# Patient Record
Sex: Female | Born: 1975 | Race: Black or African American | Hispanic: No | Marital: Married | Smoking: Never smoker
Health system: Southern US, Community
[De-identification: ages and names within clinical notes are randomized; demographics above are authoritative.]

## PROBLEM LIST (undated history)

## (undated) DIAGNOSIS — N3010 Interstitial cystitis (chronic) without hematuria: Secondary | ICD-10-CM

## (undated) DIAGNOSIS — R0609 Other forms of dyspnea: Secondary | ICD-10-CM

## (undated) DIAGNOSIS — K2970 Gastritis, unspecified, without bleeding: Secondary | ICD-10-CM

## (undated) DIAGNOSIS — Z9289 Personal history of other medical treatment: Secondary | ICD-10-CM

## (undated) DIAGNOSIS — R519 Headache, unspecified: Secondary | ICD-10-CM

## (undated) DIAGNOSIS — R51 Headache: Secondary | ICD-10-CM

## (undated) DIAGNOSIS — G43909 Migraine, unspecified, not intractable, without status migrainosus: Secondary | ICD-10-CM

## (undated) DIAGNOSIS — K219 Gastro-esophageal reflux disease without esophagitis: Secondary | ICD-10-CM

## (undated) DIAGNOSIS — R0600 Dyspnea, unspecified: Secondary | ICD-10-CM

## (undated) DIAGNOSIS — J45909 Unspecified asthma, uncomplicated: Secondary | ICD-10-CM

## (undated) DIAGNOSIS — E119 Type 2 diabetes mellitus without complications: Secondary | ICD-10-CM

## (undated) DIAGNOSIS — F419 Anxiety disorder, unspecified: Secondary | ICD-10-CM

## (undated) DIAGNOSIS — E785 Hyperlipidemia, unspecified: Secondary | ICD-10-CM

---

## 2001-07-15 DIAGNOSIS — Z9289 Personal history of other medical treatment: Secondary | ICD-10-CM

## 2001-07-15 HISTORY — DX: Personal history of other medical treatment: Z92.89

## 2002-07-15 HISTORY — PX: ABDOMINAL HYSTERECTOMY: SHX81

## 2004-07-15 DIAGNOSIS — E119 Type 2 diabetes mellitus without complications: Secondary | ICD-10-CM

## 2004-07-15 HISTORY — DX: Type 2 diabetes mellitus without complications: E11.9

## 2005-07-15 HISTORY — PX: BLADDER SURGERY: SHX569

## 2012-02-15 ENCOUNTER — Ambulatory Visit (INDEPENDENT_AMBULATORY_CARE_PROVIDER_SITE_OTHER): Payer: 59 | Admitting: Family Medicine

## 2012-02-15 VITALS — BP 109/74 | HR 82 | Temp 98.4°F | Resp 18 | Ht 64.3 in | Wt 229.0 lb

## 2012-02-15 DIAGNOSIS — B9689 Other specified bacterial agents as the cause of diseases classified elsewhere: Secondary | ICD-10-CM

## 2012-02-15 DIAGNOSIS — E119 Type 2 diabetes mellitus without complications: Secondary | ICD-10-CM

## 2012-02-15 DIAGNOSIS — N898 Other specified noninflammatory disorders of vagina: Secondary | ICD-10-CM

## 2012-02-15 DIAGNOSIS — E0590 Thyrotoxicosis, unspecified without thyrotoxic crisis or storm: Secondary | ICD-10-CM

## 2012-02-15 DIAGNOSIS — B373 Candidiasis of vulva and vagina: Secondary | ICD-10-CM

## 2012-02-15 DIAGNOSIS — IMO0001 Reserved for inherently not codable concepts without codable children: Secondary | ICD-10-CM

## 2012-02-15 DIAGNOSIS — N760 Acute vaginitis: Secondary | ICD-10-CM

## 2012-02-15 LAB — POCT WET PREP WITH KOH
Clue Cells Wet Prep HPF POC: 100
Trichomonas, UA: NEGATIVE
Yeast Wet Prep HPF POC: NEGATIVE

## 2012-02-15 LAB — LIPID PANEL
HDL: 35 mg/dL — ABNORMAL LOW (ref >39)
LDL Cholesterol: 195 mg/dL — ABNORMAL HIGH (ref 0–99)
Total CHOL/HDL Ratio: 7.6 Ratio

## 2012-02-15 LAB — POCT UA - MICROSCOPIC ONLY: Crystals, Ur, HPF, POC: NEGATIVE

## 2012-02-15 LAB — POCT URINALYSIS DIPSTICK
Bilirubin, UA: NEGATIVE
Spec Grav, UA: 1.02

## 2012-02-15 LAB — COMPREHENSIVE METABOLIC PANEL
ALT: 20 U/L (ref 0–35)
Alkaline Phosphatase: 74 U/L (ref 39–117)
Creat: 0.62 mg/dL (ref 0.50–1.10)
Glucose, Bld: 356 mg/dL — ABNORMAL HIGH (ref 70–99)
Sodium: 136 mEq/L (ref 135–145)
Total Bilirubin: 0.4 mg/dL (ref 0.3–1.2)
Total Protein: 7.2 g/dL (ref 6.0–8.3)

## 2012-02-15 LAB — POCT CBC
Hemoglobin: 15.6 g/dL (ref 12.2–16.2)
MCH, POC: 26.9 pg — AB (ref 27–31.2)
MPV: 8.8 fL (ref 0–99.8)
POC MID %: 8.7 %M (ref 0–12)
RBC: 5.8 M/uL — AB (ref 4.04–5.48)
WBC: 6.4 10*3/uL (ref 4.6–10.2)

## 2012-02-15 LAB — POCT GLYCOSYLATED HEMOGLOBIN (HGB A1C): Hemoglobin A1C: 14

## 2012-02-15 LAB — GLUCOSE, POCT (MANUAL RESULT ENTRY): POC Glucose: 357 mg/dl — AB (ref 70–99)

## 2012-02-15 MED ORDER — LISINOPRIL 10 MG PO TABS: 10.0000 mg | ORAL_TABLET | Freq: Every day | ORAL | Status: DC

## 2012-02-15 MED ORDER — METRONIDAZOLE 500 MG PO TABS: 500.0000 mg | ORAL_TABLET | Freq: Two times a day (BID) | ORAL | Status: AC

## 2012-02-15 MED ORDER — BLOOD GLUCOSE METER KIT: PACK | Status: DC

## 2012-02-15 MED ORDER — INSULIN ASPART PROT & ASPART (70-30 MIX) 100 UNIT/ML ~~LOC~~ SUSP: 80.0000 [IU] | Freq: Two times a day (BID) | SUBCUTANEOUS | Status: DC

## 2012-02-15 MED ORDER — FLUCONAZOLE 150 MG PO TABS: 150.0000 mg | ORAL_TABLET | Freq: Once | ORAL | Status: AC

## 2012-02-15 NOTE — Progress Notes (Signed)
Subjective: 36 year old Afro-American female here for her first time. She has a long history of diabetes mellitus. She was formerly on insulin any evidence of 7030 twice a day, but she is out of medicine for about 8 months. She has not had any insurance and not been able to afford things. He has had a hysterectomy. Had a history of interstitial fibrosis, and is on treatment for that. She's had recurrent yeast infections. She saw an eye doctor about 2 months ago and was told she had some bleeding behind her eye she does not have a operational glucose meter. She has had some bladder surgery in addition to the urine surgery. She was on her blood pressure pill also, lisinopril she has a history of asthma and does still have an albuterol inhaler which she infrequently uses.  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 She is married. Has 2 children.  Objective: Obese lady in no acute distress. Her TMs are normal. Eyes PERRLA. Fundi appear benign but I did not get a great examination since the pupils are little constricted today. Her throat was clear. Neck supple without nodes or thyromegaly. No carotid bruits. Chest is clear to auscultation. Heart regular without murmurs gallops or arrhythmias. Abdomen soft. Has some mild generalized tenderness. External genitalia appear normal. Vaginal mucosa has a little whitish discharge, not as bad as it sounds like it might be. Cervix is surgically absent. Wet prep was taken.  Assessment: Diabetes mellitus, controlled Obesity History of diabetic retinopathy History of interstitial cystitis Vaginitis History of hysterectomy Vaginitis in patient with history of recurrent monilia vaginiti   Results for orders placed in visit on 02/15/12  POCT CBC      Component Value Range   WBC 6.4  4.6 - 10.2 K/uL   Lymph, poc 2.2  0.6 - 3.4   POC LYMPH PERCENT 35.1  10 - 50 %L   MID (cbc) 0.6  0 - 0.9   POC MID % 8.7  0 - 12 %M   POC Granulocyte 3.6  2 -  6.9   Granulocyte percent 56.2  37 - 80 %G   RBC 5.80 (*) 4.04 - 5.48 M/uL   Hemoglobin 15.6  12.2 - 16.2 g/dL   HCT, POC 82.9 (*) 56.2 - 47.9 %   MCV 87.4  80 - 97 fL   MCH, POC 26.9 (*) 27 - 31.2 pg   MCHC 30.8 (*) 31.8 - 35.4 g/dL   RDW, POC 13.0     Platelet Count, POC 352  142 - 424 K/uL   MPV 8.8  0 - 99.8 fL  GLUCOSE, POCT (MANUAL RESULT ENTRY)      Component Value Range   POC Glucose 357 (*) 70 - 99 mg/dl  POCT UA - MICROSCOPIC ONLY      Component Value Range   WBC, Ur, HPF, POC 0-1     RBC, urine, microscopic 0-1     Bacteria, U Microscopic trace     Mucus, UA neg     Epithelial cells, urine per micros 0-2     Crystals, Ur, HPF, POC neg     Casts, Ur, LPF, POC neg     Yeast, UA neg    POCT URINALYSIS DIPSTICK      Component Value Range   Color, UA yellow     Clarity, UA clear     Glucose, UA 500mg /dl     Bilirubin, UA neg     Ketones, UA trace  Spec Grav, UA 1.020     Blood, UA neg     pH, UA 5.0     Protein, UA neg     Urobilinogen, UA 0.2     Nitrite, UA neg     Leukocytes, UA Negative    POCT WET PREP WITH KOH      Component Value Range   Trichomonas, UA Negative     Clue Cells Wet Prep HPF POC 100%     Epithelial Wet Prep HPF POC 2-10     Yeast Wet Prep HPF POC neg     Bacteria Wet Prep HPF POC 4++     RBC Wet Prep HPF POC 0-2     WBC Wet Prep HPF POC 5-8     KOH Prep POC Positive    POCT GLYCOSYLATED HEMOGLOBIN (HGB A1C)      Component Value Range   Hemoglobin A1C 14.0     s  Plan: Check labs. It is obvious that we will need to work hard at getting her diabetes under control.  Discussed with her the need for compliance.  Return in 1 mo.  Gradually go up on the insulin to the prescribed dose.

## 2012-02-15 NOTE — Patient Instructions (Addendum)
It is very important to get your diabetes under excellent control and to keep it that way. You could major vision initially if you do not.  Treat the vaginitis as directed.    If worse or not improving return sooner, otherwise will plan to see you in a month.

## 2012-02-16 ENCOUNTER — Encounter: Payer: Self-pay | Admitting: Family Medicine

## 2012-02-26 ENCOUNTER — Ambulatory Visit (INDEPENDENT_AMBULATORY_CARE_PROVIDER_SITE_OTHER): Payer: 59 | Admitting: Family Medicine

## 2012-02-26 VITALS — BP 110/68 | HR 92 | Temp 97.8°F | Resp 14 | Ht 65.0 in | Wt 230.4 lb

## 2012-02-26 DIAGNOSIS — E119 Type 2 diabetes mellitus without complications: Secondary | ICD-10-CM

## 2012-02-26 DIAGNOSIS — B373 Candidiasis of vulva and vagina: Secondary | ICD-10-CM

## 2012-02-26 LAB — POCT WET PREP WITH KOH: KOH Prep POC: POSITIVE

## 2012-02-26 MED ORDER — FLUCONAZOLE 150 MG PO TABS: ORAL_TABLET | ORAL | Status: DC

## 2012-02-26 NOTE — Progress Notes (Signed)
[facility] [address], [place] [address] [phone]  Date:  02/26/2012   Name:  Marissa Barrett   DOB:  [birth year]   MRN:  [id number]  PCP:  No primary provider on file.    Chief Complaint: Vaginal Discharge   History of Present Illness:  Marissa Barrett is a 36 y.o. very pleasant female patient who presents with the following:  History of hysterectomy and uncontrolled DM.  She was seen here on the 3rd of this month, and treated for BV and yeast vaginitis with diflucan and flagyl.  She also had an A1c of 14 at that time, and was started back on Novolog 70/30.  However, she notes that her vaginal discharge and itching has "gotten worse.'   Her glucose has been running in the 200's, better than the 300's where she had been in the past.  She has had trouble with recurrent yeast vaginitis in the past.     She notes that she has been treated with extended regimens of diflucan in the past- up to 30 days of daily diflucan at a time.    Marissa Barrett has cut out most sugars from her diet, and is trying to get her DM under control.  Her weight has not changed since yer last visit here.    She is married and has 2 teenage children.  She does not feel that there is any risk of STI  There is no problem list on file for this patient.   No past medical history on file.  No past surgical history on file.  History  Substance Use Topics  . Smoking status: Never Smoker   . Smokeless tobacco: Never Used  . Alcohol Use: Not on file    No family history on file.  No Known Allergies  Medication list has been reviewed and updated.  Current Outpatient Prescriptions on File Prior to Visit  Medication Sig Dispense Refill  . Blood Glucose Monitoring Suppl (BLOOD GLUCOSE METER) kit Use as instructed  1 each  0  . insulin aspart protamine-insulin aspart (NOVOLOG MIX 70/30) (70-30) 100 UNIT/ML injection Inject 80 Units into the skin 2 (two) times daily with a meal.  40 mL  12  .  lisinopril (PRINIVIL,ZESTRIL) 10 MG tablet Take 1 tablet (10 mg total) by mouth daily.  30 tablet  5  . metroNIDAZOLE (FLAGYL) 500 MG tablet Take 1 tablet (500 mg total) by mouth 2 (two) times daily before lunch and supper.  14 tablet  0    Review of Systems:  As per HPI- otherwise negative.   Physical Examination: Filed Vitals:   02/26/12 0753  BP: 110/68  Pulse: 92  Temp: 97.8 F (36.6 C)  Resp: 14   Filed Vitals:   02/26/12 0753  Height: 5\' 5"  (1.651 m)  Weight: 230 lb 6.4 oz (104.509 kg)   Body mass index is 38.34 kg/(m^2). Ideal Body Weight: Weight in (lb) to have BMI = 25: 149.9   GEN: WDWN, NAD, Non-toxic, A & O x 3, obese HEENT: Atraumatic, Normocephalic. Neck supple. No masses, No LAD. Ears and Nose: No external deformity. CV: RRR, No M/G/R. No JVD. No thrill. No extra heart sounds. PULM: CTA B, no wheezes, crackles, rhonchi. No retractions. No resp. distress. No accessory muscle use. ABD: S, NT, ND, +BS. No rebound. No HSM. GU: performed external exam- irritation and redness of external itching EXTR: No c/c/e NEURO Normal gait.  PSYCH: Normally interactive. Conversant. Not depressed or  anxious appearing.  Calm demeanor.   Results for orders placed in visit on 02/26/12  POCT WET PREP WITH KOH      Component Value Range   Trichomonas, UA Negative     Clue Cells Wet Prep HPF POC 1-2     Epithelial Wet Prep HPF POC 2-4     Yeast Wet Prep HPF POC positive     Bacteria Wet Prep HPF POC 2+     RBC Wet Prep HPF POC neg     WBC Wet Prep HPF POC 15-20     KOH Prep POC Positive       Assessment and Plan: 1. Yeast vaginitis  POCT Wet Prep with KOH, fluconazole (DIFLUCAN) 150 MG tablet  2. DM type 2 (diabetes mellitus, type 2)     Will use a more extended regimen of diflucan- one every 3 days for 3 doses, then every week as needed for up to 6 months. She is working hard on her diet.  Continue insulin, and see pt instructions for more.  BP is well controlled.     Abbe Amsterdam, MD

## 2012-02-26 NOTE — Patient Instructions (Addendum)
Work on eating yogurt every day.  Continue taking your insulin, and let's check back in 6 weeks to follow-up your A1c

## 2012-03-08 DIAGNOSIS — R109 Unspecified abdominal pain: Secondary | ICD-10-CM | POA: Insufficient documentation

## 2012-03-08 DIAGNOSIS — N3010 Interstitial cystitis (chronic) without hematuria: Secondary | ICD-10-CM | POA: Insufficient documentation

## 2012-03-08 DIAGNOSIS — R61 Generalized hyperhidrosis: Secondary | ICD-10-CM | POA: Insufficient documentation

## 2012-03-08 DIAGNOSIS — R079 Chest pain, unspecified: Principal | ICD-10-CM | POA: Insufficient documentation

## 2012-03-08 DIAGNOSIS — R0602 Shortness of breath: Secondary | ICD-10-CM | POA: Insufficient documentation

## 2012-03-08 NOTE — ED Notes (Signed)
Patient complaining of intermittent chest pain x 1 week; reports that chest pain worsened over the course of the day.  Location of chest pain mid-sternal; radiates to back.  Describes chest pain as "spasm".  Denies shortness of breath, cough, recent cold symptoms.  Reports nausea.

## 2012-03-09 ENCOUNTER — Other Ambulatory Visit: Payer: Self-pay

## 2012-03-09 ENCOUNTER — Observation Stay (HOSPITAL_COMMUNITY)
Admission: EM | Admit: 2012-03-09 | Discharge: 2012-03-09 | Disposition: A | Discharge status: Home / Self Care | Payer: 59 | Attending: Emergency Medicine | Admitting: Emergency Medicine

## 2012-03-09 ENCOUNTER — Emergency Department (HOSPITAL_COMMUNITY)
Admit: 2012-03-09 | Discharge: 2012-03-09 | Disposition: A | Discharge status: Home / Self Care | Payer: Medicaid Other | Attending: Emergency Medicine | Admitting: Emergency Medicine

## 2012-03-09 ENCOUNTER — Encounter (HOSPITAL_COMMUNITY): Payer: Self-pay | Admitting: Emergency Medicine

## 2012-03-09 DIAGNOSIS — N3010 Interstitial cystitis (chronic) without hematuria: Secondary | ICD-10-CM | POA: Insufficient documentation

## 2012-03-09 DIAGNOSIS — R0789 Other chest pain: Secondary | ICD-10-CM | POA: Insufficient documentation

## 2012-03-09 DIAGNOSIS — R072 Precordial pain: Secondary | ICD-10-CM

## 2012-03-09 DIAGNOSIS — R079 Chest pain, unspecified: Secondary | ICD-10-CM

## 2012-03-09 DIAGNOSIS — R1011 Right upper quadrant pain: Secondary | ICD-10-CM | POA: Insufficient documentation

## 2012-03-09 HISTORY — DX: Unspecified asthma, uncomplicated: J45.909

## 2012-03-09 HISTORY — DX: Hyperlipidemia, unspecified: E78.5

## 2012-03-09 HISTORY — DX: Interstitial cystitis (chronic) without hematuria: N30.10

## 2012-03-09 LAB — URINALYSIS, ROUTINE W REFLEX MICROSCOPIC
Leukocytes, UA: NEGATIVE
Nitrite: NEGATIVE
Specific Gravity, Urine: 1.03 (ref 1.005–1.030)
Urobilinogen, UA: 1 mg/dL (ref 0.0–1.0)
pH: 5.5 (ref 5.0–8.0)

## 2012-03-09 LAB — D-DIMER, QUANTITATIVE: D-Dimer, Quant: 0.28 ug/mL-FEU (ref 0.00–0.48)

## 2012-03-09 LAB — TROPONIN I: Troponin I: <0.3 ng/mL (ref <0.30)

## 2012-03-09 LAB — POCT I-STAT TROPONIN I: Troponin i, poc: 0 ng/mL (ref 0.00–0.08)

## 2012-03-09 LAB — CBC WITH DIFFERENTIAL/PLATELET
Basophils Absolute: 0 10*3/uL (ref 0.0–0.1)
Basophils Relative: 0 % (ref 0–1)
Eosinophils Absolute: 0.1 10*3/uL (ref 0.0–0.7)
Hemoglobin: 15 g/dL (ref 12.0–15.0)
MCH: 28.4 pg (ref 26.0–34.0)
MCHC: 35.2 g/dL (ref 30.0–36.0)
Monocytes Absolute: 0.4 10*3/uL (ref 0.1–1.0)
Monocytes Relative: 6 % (ref 3–12)
Neutro Abs: 3.3 10*3/uL (ref 1.7–7.7)
Neutrophils Relative %: 48 % (ref 43–77)
RDW: 12.3 % (ref 11.5–15.5)

## 2012-03-09 LAB — COMPREHENSIVE METABOLIC PANEL
AST: 24 U/L (ref 0–37)
Albumin: 3.4 g/dL — ABNORMAL LOW (ref 3.5–5.2)
BUN: 16 mg/dL (ref 6–23)
Chloride: 103 mEq/L (ref 96–112)
Creatinine, Ser: 0.38 mg/dL — ABNORMAL LOW (ref 0.50–1.10)
Potassium: 4.2 mEq/L (ref 3.5–5.1)
Total Protein: 7.4 g/dL (ref 6.0–8.3)

## 2012-03-09 MED ORDER — KETOROLAC TROMETHAMINE 30 MG/ML IJ SOLN
30.0000 mg | Freq: Once | INTRAMUSCULAR | Status: AC
  Administered 2012-03-09: 30 mg via INTRAVENOUS
  Filled 2012-03-09: qty 1

## 2012-03-09 MED ORDER — ASPIRIN 81 MG PO CHEW
324.0000 mg | CHEWABLE_TABLET | Freq: Once | ORAL | Status: AC
Start: 2012-03-09 — End: 2012-03-09
  Administered 2012-03-09: 324 mg via ORAL
  Filled 2012-03-09: qty 4
  Filled 2012-03-09: qty 1

## 2012-03-09 MED ORDER — IBUPROFEN 800 MG PO TABS: 800.0000 mg | ORAL_TABLET | Freq: Three times a day (TID) | ORAL | Status: AC | PRN

## 2012-03-09 NOTE — ED Notes (Signed)
Pt taken to ECHO

## 2012-03-09 NOTE — ED Notes (Signed)
Pt transported to Echo. 

## 2012-03-09 NOTE — ED Provider Notes (Addendum)
History     CSN: [id number]  Arrival date & time 03/08/12  2351   First MD Initiated Contact with Patient 03/09/12 0234      Chief Complaint  Patient presents with  . Chest Pain    (Consider location/radiation/quality/duration/timing/severity/associated sxs/prior treatment) HPI This is a 36 year old black female with a three-day history of intermittent chest pain. The pain is substernal and is accompanied by right flank pain. The pain feels like a cramp. The pain lasts for several minutes at a time. It is accompanied by mild shortness of breath and diaphoresis but not nausea. Nothing seems to trigger it, exacerbated or mitigate it. The pain is moderate in severity and has been worsening. She denies leg pain or swelling. She is having some nausea but is not associated with the episodes of chest pain. She also complains of right lower quadrant abdominal pain consistent with her chronic interstitial cystitis.  Past Medical History  Diagnosis Date  . Interstitial cystitis   . Asthma   . Diabetes mellitus     Past Surgical History  Procedure Date  . Abdominal hysterectomy     History reviewed. No pertinent family history.  History  Substance Use Topics  . Smoking status: Never Smoker   . Smokeless tobacco: Never Used  . Alcohol Use: No    OB History    Grav Para Term Preterm Abortions TAB SAB Ect Mult Living                  Review of Systems  All other systems reviewed and are negative.    Allergies  Review of patient's allergies indicates no known allergies.  Home Medications   Current Outpatient Rx  Name Route Sig Dispense Refill  . FLUCONAZOLE 150 MG PO TABS  Take one every 3 days for 3 doses, then take every week for up to 6 months 30 tablet 0  . INSULIN ASPART PROT & ASPART (70-30) 100 UNIT/ML [place] SUSP Subcutaneous Inject 80 Units into the skin 2 (two) times daily with a meal. 40 mL 12    Four 10 ml vials.  Also supply needed syringes a ...  . LISINOPRIL  10 MG PO TABS Oral Take 1 tablet (10 mg total) by mouth daily. 30 tablet 5  . BLOOD GLUCOSE METER KIT  Use as instructed 1 each 0    Supply sufficient test strips to test twice daily, ...    BP 125/80  Pulse 78  Temp 98.2 F (36.8 C) (Oral)  Resp 19  SpO2 98%  LMP 02/15/2003  Physical Exam General: Well-developed, well-nourished female in no acute distress; appearance consistent with age of record HENT: normocephalic, atraumatic Eyes: pupils equal round and reactive to light; extraocular muscles intact Neck: supple Heart: regular rate and rhythm; no murmurs Chest: Less sternal tenderness that does not reproduce the pain of the chief complaint Lungs: clear to auscultation bilaterally Abdomen: soft; nondistended; mild right lower quadrant tenderness; bowel sounds present Extremities: No deformity; full range of motion; pulses normal; no edema Neurologic: Awake, alert and oriented; motor function intact in all extremities and symmetric; no facial droop Skin: Warm and dry Psychiatric: Normal mood and affect    ED Course  Procedures (including critical care time)     MDM   Nursing notes and vitals signs, including pulse oximetry, reviewed.  Summary of this visit's results, reviewed by myself:  Labs:  Results for orders placed during the hospital encounter of 03/09/12  CBC WITH DIFFERENTIAL  Component Value Range   WBC 7.0  4.0 - 10.5 K/uL   RBC 5.28 (*) 3.87 - 5.11 MIL/uL   Hemoglobin 15.0  12.0 - 15.0 g/dL   HCT 16.1  09.6 - 04.5 %   MCV 80.7  78.0 - 100.0 fL   MCH 28.4  26.0 - 34.0 pg   MCHC 35.2  30.0 - 36.0 g/dL   RDW 40.9  81.1 - 91.4 %   Platelets 262  150 - 400 K/uL   Neutrophils Relative 48  43 - 77 %   Neutro Abs 3.3  1.7 - 7.7 K/uL   Lymphocytes Relative 44  12 - 46 %   Lymphs Abs 3.1  0.7 - 4.0 K/uL   Monocytes Relative 6  3 - 12 %   Monocytes Absolute 0.4  0.1 - 1.0 K/uL   Eosinophils Relative 1  0 - 5 %   Eosinophils Absolute 0.1  0.0 - 0.7  K/uL   Basophils Relative 0  0 - 1 %   Basophils Absolute 0.0  0.0 - 0.1 K/uL  COMPREHENSIVE METABOLIC PANEL      Component Value Range   Sodium 136  135 - 145 mEq/L   Potassium 4.2  3.5 - 5.1 mEq/L   Chloride 103  96 - 112 mEq/L   CO2 24  19 - 32 mEq/L   Glucose, Bld 229 (*) 70 - 99 mg/dL   BUN 16  6 - 23 mg/dL   Creatinine, Ser 7.82 (*) 0.50 - 1.10 mg/dL   Calcium 9.4  8.4 - 95.6 mg/dL   Total Protein 7.4  6.0 - 8.3 g/dL   Albumin 3.4 (*) 3.5 - 5.2 g/dL   AST 24  0 - 37 U/L   ALT 24  0 - 35 U/L   Alkaline Phosphatase 72  39 - 117 U/L   Total Bilirubin 0.2 (*) 0.3 - 1.2 mg/dL   GFR calc non Af Amer >90  >90 mL/min   GFR calc Af Amer >90  >90 mL/min  POCT I-STAT TROPONIN I      Component Value Range   Troponin i, poc 0.00  0.00 - 0.08 ng/mL   Comment 3           URINALYSIS, ROUTINE W REFLEX MICROSCOPIC      Component Value Range   Color, Urine YELLOW  YELLOW   APPearance CLOUDY (*) CLEAR   Specific Gravity, Urine 1.030  1.005 - 1.030   pH 5.5  5.0 - 8.0   Glucose, UA 100 (*) NEGATIVE mg/dL   Hgb urine dipstick NEGATIVE  NEGATIVE   Bilirubin Urine NEGATIVE  NEGATIVE   Ketones, ur NEGATIVE  NEGATIVE mg/dL   Protein, ur NEGATIVE  NEGATIVE mg/dL   Urobilinogen, UA 1.0  0.0 - 1.0 mg/dL   Nitrite NEGATIVE  NEGATIVE   Leukocytes, UA NEGATIVE  NEGATIVE  TROPONIN I      Component Value Range   Troponin I <0.30  <0.30 ng/mL  D-DIMER, QUANTITATIVE      Component Value Range   D-Dimer, Quant 0.28  0.00 - 0.48 ug/mL-FEU    Imaging Studies: Dg Chest 2 View  03/09/2012  *RADIOLOGY REPORT*  Clinical Data: Chest pain.  CHEST - 2 VIEW  Comparison: None.  Findings: Lungs are clear.  Heart size is normal.  No pneumothorax or pleural fluid.  IMPRESSION: Negative chest.   Original Report Authenticated By: Bernadene Bell. Maricela Curet, M.D.     Date: 03/08/2012 11:59 PM  Rate:  88  Rhythm: normal sinus rhythm  QRS Axis: normal  Intervals: normal  ST/T Wave abnormalities: normal  Conduction  Disutrbances: none  Narrative Interpretation: unremarkable  Comparison with previous EKG: none available    Date: 03/09/2012 2:47 AM (during episode of pain)  Rate: 82  Rhythm: normal sinus rhythm  QRS Axis: normal  Intervals: normal  ST/T Wave abnormalities: normal  Conduction Disutrbances: none  Narrative Interpretation: unremarkable  Comparison with previous EKG: unchanged  4:11 AM We'll place in the CDU on the chest pain protocol.   Date: 03/09/2012 5:45 AM  Rate: 77  Rhythm: normal sinus rhythm  QRS Axis: normal  Intervals: normal  ST/T Wave abnormalities: normal  Conduction Disutrbances: none  Narrative Interpretation: unremarkable  Comparison with previous EKG: unchanged                  Hanley Seamen, MD 03/09/12 0411  Hanley Seamen, MD 03/09/12 [phone]

## 2012-03-09 NOTE — ED Provider Notes (Signed)
8:03 AM Patient is in CDU under observation, chest pain protocol.  Sign out received from Dr Read Drivers.  Patient has had 3 days of substernal CP with associated SOB and diaphoresis.  Plan is for stress echo this morning.  Pt currently is at vascular lab having study done.    9:42 AM Received call from Dr Anne Fu, cardiology, who states stress echo is nondiagnostic because patient did not reach target heart rate.  However, the images he did see were without abnormality.    10:21 AM Spoke with Rosalyn Charters cardiology, who will send someone to see the patient.  Patient is A&Ox4, NAD, RRR, no m/r/g, lungs CTAB, abd soft, NT, extremities without edema, distal pulses intact.   11:16 AM Discussed patient with Judd Lien cardiology PA - they believe the pain is not cardiac and patient may be d/c home without cardiology follow up.  She noted RUQ pain to me and in her note.  I have reexamined the patient.  Pt with right costochondral margin tenderness, mild right sided abdominal tenderness.  Pt notes this right sided abdominal tenderness is chronic and unchanged.  LFTs, WBC normal.  Pt afebrile, nontoxic.  Doubt gallbladder pathology causing symptoms.  Plan is for d/c home with PCP resources for follow up.  Pt reports improvement of pain with toradol.   Pt given return precautions.  Pt verbalizes understanding and agrees with plan.     Results for orders placed during the hospital encounter of 03/09/12  CBC WITH DIFFERENTIAL      Component Value Range   WBC 7.0  4.0 - 10.5 K/uL   RBC 5.28 (*) 3.87 - 5.11 MIL/uL   Hemoglobin 15.0  12.0 - 15.0 g/dL   HCT 54.0  98.1 - 19.1 %   MCV 80.7  78.0 - 100.0 fL   MCH 28.4  26.0 - 34.0 pg   MCHC 35.2  30.0 - 36.0 g/dL   RDW 47.8  29.5 - 62.1 %   Platelets 262  150 - 400 K/uL   Neutrophils Relative 48  43 - 77 %   Neutro Abs 3.3  1.7 - 7.7 K/uL   Lymphocytes Relative 44  12 - 46 %   Lymphs Abs 3.1  0.7 - 4.0 K/uL   Monocytes Relative 6  3 - 12 %   Monocytes  Absolute 0.4  0.1 - 1.0 K/uL   Eosinophils Relative 1  0 - 5 %   Eosinophils Absolute 0.1  0.0 - 0.7 K/uL   Basophils Relative 0  0 - 1 %   Basophils Absolute 0.0  0.0 - 0.1 K/uL  COMPREHENSIVE METABOLIC PANEL      Component Value Range   Sodium 136  135 - 145 mEq/L   Potassium 4.2  3.5 - 5.1 mEq/L   Chloride 103  96 - 112 mEq/L   CO2 24  19 - 32 mEq/L   Glucose, Bld 229 (*) 70 - 99 mg/dL   BUN 16  6 - 23 mg/dL   Creatinine, Ser 3.08 (*) 0.50 - 1.10 mg/dL   Calcium 9.4  8.4 - 65.7 mg/dL   Total Protein 7.4  6.0 - 8.3 g/dL   Albumin 3.4 (*) 3.5 - 5.2 g/dL   AST 24  0 - 37 U/L   ALT 24  0 - 35 U/L   Alkaline Phosphatase 72  39 - 117 U/L   Total Bilirubin 0.2 (*) 0.3 - 1.2 mg/dL   GFR calc non Af Amer >90  >  90 mL/min   GFR calc Af Amer >90  >90 mL/min  POCT I-STAT TROPONIN I      Component Value Range   Troponin i, poc 0.00  0.00 - 0.08 ng/mL   Comment 3           URINALYSIS, ROUTINE W REFLEX MICROSCOPIC      Component Value Range   Color, Urine YELLOW  YELLOW   APPearance CLOUDY (*) CLEAR   Specific Gravity, Urine 1.030  1.005 - 1.030   pH 5.5  5.0 - 8.0   Glucose, UA 100 (*) NEGATIVE mg/dL   Hgb urine dipstick NEGATIVE  NEGATIVE   Bilirubin Urine NEGATIVE  NEGATIVE   Ketones, ur NEGATIVE  NEGATIVE mg/dL   Protein, ur NEGATIVE  NEGATIVE mg/dL   Urobilinogen, UA 1.0  0.0 - 1.0 mg/dL   Nitrite NEGATIVE  NEGATIVE   Leukocytes, UA NEGATIVE  NEGATIVE  TROPONIN I      Component Value Range   Troponin I <0.30  <0.30 ng/mL  D-DIMER, QUANTITATIVE      Component Value Range   D-Dimer, Quant 0.28  0.00 - 0.48 ug/mL-FEU   Dg Chest 2 View  03/09/2012  *RADIOLOGY REPORT*  Clinical Data: Chest pain.  CHEST - 2 VIEW  Comparison: None.  Findings: Lungs are clear.  Heart size is normal.  No pneumothorax or pleural fluid.  IMPRESSION: Negative chest.   Original Report Authenticated By: Bernadene Bell. Maricela Curet, M.D.       [place], [place] 03/09/12 1529

## 2012-03-09 NOTE — Consult Note (Signed)
Consult Note  Patient ID: Essense Bousquet MRN: [id number], SOB: 1975-12-11 35 y.o. Date of Encounter: 03/09/2012, 10:44 AM  Primary Physician: None Primary Cardiologist: None  Chief Complaint: chest pain  HPI: 36 y.o. female w/ PMHx significant for uncontrolled DM and HLD who presented to [facility] on 03/08/2012 with complaints of chest pain.  Stress test in 2005 as preop eval for gastric bypass that was reportedly normal (did not have gastric bypass). Friday 16th woke up and felt a sharp muscle spasm pain in chest. Radiates to right back. Lasted a couple of minutes then resolved. Has occurred about once a day since that time, but has been more severe since this last Friday 23rd. Not worse with exertion. Worse with deep inspiration. Worse with eating. No change with movement. Difficulty taking a deep breath when the pain is present and feels "a little clammy" with the pain. She is able to walk and climb stairs without chest pain, but does have dyspnea if she pushes herself.  In the ED, EKG revealed NSR with no acute ST/T changes. CXR was without acute cardiopulmonary abnormalities. Labs are significant for normal troponin x2, Glucose 229, DDimer 0.28, otherwise unremarkable CBC/CMET. She was monitored overnight in the CDU and underwent stress echo this morning. She was unable to reach target heart rate and therefore the test was non diagnostic.    Past Medical History  Diagnosis Date  . Interstitial cystitis   . Asthma   . Diabetes mellitus   . HLD (hyperlipidemia)      Surgical History:  Past Surgical History  Procedure Date  . Abdominal hysterectomy      Home Meds: Medication Sig  fluconazole (DIFLUCAN) 150 MG tablet Take one every 3 days for 3 doses, then take every week for up to 6 months  insulin aspart protamine-insulin aspart (NOVOLOG MIX 70/30) (70-30) 100 UNIT/ML injection Inject 80 Units into the skin 2 (two) times daily with a meal.  lisinopril (PRINIVIL,ZESTRIL)  10 MG tablet Take 1 tablet (10 mg total) by mouth daily.  Blood Glucose Monitoring Suppl (BLOOD GLUCOSE METER) kit Use as instructed  SIMVISTATIN 20mg  1 table daily    Allergies: No Known Allergies  History   Social History  . Marital Status: Unknown    Spouse Name: N/A    Number of Children: N/A  . Years of Education: N/A   Occupational History  . Not on file.   Social History Main Topics  . Smoking status: Never Smoker   . Smokeless tobacco: Never Used  . Alcohol Use: Yes     rarely  . Drug Use: No  . Sexually Active: Not on file   Other Topics Concern  . Not on file   Social History Narrative  . No narrative on file     Family History  Problem Relation Age of Onset  . Heart disease      No known family history    Review of Systems: General: negative for chills, fever, night sweats or weight changes.  Cardiovascular: (+) chest pain, dyspnea on exertion; negative for shortness of breath, edema, orthopnea, palpitations, paroxysmal nocturnal dyspnea  Dermatological: negative for rash Respiratory: negative for cough or wheezing Urologic: negative for hematuria Abdominal: negative for nausea, vomiting, diarrhea, bright red blood per rectum, melena, or hematemesis Neurologic: negative for visual changes, syncope, or dizziness All other systems reviewed and are otherwise negative except as noted above.  Labs:   Component Value Date   WBC 7.0 03/09/2012  HGB 15.0 03/09/2012   HCT 42.6 03/09/2012   MCV 80.7 03/09/2012   PLT 262 03/09/2012    Lab 03/09/12 0002  NA 136  K 4.2  CL 103  CO2 24  BUN 16  CREATININE 0.38*  CALCIUM 9.4  PROT 7.4  BILITOT 0.2*  ALKPHOS 72  ALT 24  AST 24  GLUCOSE 229*     03/09/2012 00:16 03/09/2012 03:09  Troponin I  <0.30  Troponin i, poc 0.00    Component Value Date   CHOL 266* 02/15/2012   HDL 35* 02/15/2012   LDLCALC 195* 02/15/2012   TRIG 181* 02/15/2012   Component Value Date   DDIMER 0.28 03/09/2012     Radiology/Studies:   03/09/2012 - Chest 2 View Findings: Lungs are clear.  Heart size is normal.  No pneumothorax or pleural fluid.  IMPRESSION: Negative chest.     03/09/12 - Stress Echo Study Conclusions: - Stress ECG conclusions: The stress ECG was normal. The stress ECG was non-diagnostic due to failure to achieve target heart rate. - Staged echo: Normal echo stress  Impressions: Overall non diagnostic due to failure to acheive target heart rate. There were no wall motion abnormalities at sub-maximal heart rate.    EKG: 03/08/12 @ 2359 - Sinus rhythm 88bpm, no acute ischemic changes  Physical Exam:  Blood pressure 107/67, pulse 82, temperature 97.9 F (36.6 C), temperature source Oral, resp. rate 18, last menstrual period 02/15/2003, SpO2 97.00%. General: Overweight black female, in no acute distress. Head: Normocephalic, atraumatic, sclera non-icteric, nares are without discharge Neck: Supple. Negative for carotid bruits. JVD not elevated. Lungs: Clear bilaterally to auscultation without wheezes, rales, or rhonchi. Breathing is unlabored. Heart: RRR with S1 S2. No murmurs, rubs, or gallops appreciated. Abdomen: Soft,RUQ tenderness, non-distended with normoactive bowel sounds. No rebound/guarding. No obvious abdominal masses. Msk:  Strength and tone appear normal for age. Extremities: No edema. No clubbing or cyanosis. Distal pedal pulses are 2+ and equal bilaterally. Neuro: Alert and oriented X 3. Moves all extremities spontaneously. Psych:  Responds to questions appropriately with a normal affect.    ASSESSMENT AND PLAN:  36 y.o. female w/ PMHx significant for uncontrolled DM and HLD who presented to [facility] on 03/08/2012 with complaints of chest pain.  See MD not below  Signed, HOPE, JESSICA PA-C 03/09/2012, 10:44 AM  As above, patient seen and examined. Briefly 36 year old female with past medical history of diabetes and hyperlipidemia for evaluation of chest  pain. Over the past 10 days she complains of substernal chest pain described as a sharp sensation radiating to the right breast. It lasts 2 minutes and resolves spontaneously. It is not exertional and there are no associated symptoms. It can increase with inspiration and eating. She does not have exertional chest pain. She has some dyspnea on exertion. Exam with questionable right upper quadrant tenderness. Liver functions normal. Electrocardiogram shows sinus rhythm with no ST changes. D-dimer normal. Cardiac markers negative. Stress echocardiogram showed no stress induced wall motion abnormalities though the patient did not achieve target heart rate. Patient symptoms are not consistent with cardiac etiology and given the above test results I do not think further ischemia evaluation is warranted. I also think gallstones is less likely but given mild right upper quadrant tenderness to palpation would pursue ultrasound. Otherwise further workup of noncardiac chest pain per emergency room. Please call with questions. Arlys John Crenshaw 11:02 AM

## 2012-03-09 NOTE — ED Notes (Signed)
Patient care assumed at this time.

## 2012-03-09 NOTE — ED Notes (Signed)
I gave the patient a warm blanket. 

## 2012-03-09 NOTE — ED Notes (Signed)
Echo calling, reporting will be send for patient in 15 mins

## 2012-03-09 NOTE — Progress Notes (Signed)
  Echocardiogram Echocardiogram Stress Test has been performed.  Marissa Barrett 03/09/2012, 9:54 AM

## 2012-03-18 NOTE — Progress Notes (Signed)
Observation review is complete for 03/08/2012 visit.

## 2012-03-21 ENCOUNTER — Other Ambulatory Visit (HOSPITAL_COMMUNITY): Payer: 59

## 2012-03-21 ENCOUNTER — Encounter (HOSPITAL_COMMUNITY): Payer: Self-pay | Admitting: *Deleted

## 2012-03-21 ENCOUNTER — Emergency Department (HOSPITAL_COMMUNITY): Payer: Medicaid Other

## 2012-03-21 ENCOUNTER — Emergency Department (HOSPITAL_COMMUNITY)
Admission: EM | Admit: 2012-03-21 | Discharge: 2012-03-21 | Disposition: A | Discharge status: Home / Self Care | Payer: Medicaid Other | Attending: Emergency Medicine | Admitting: Emergency Medicine

## 2012-03-21 DIAGNOSIS — R109 Unspecified abdominal pain: Secondary | ICD-10-CM | POA: Insufficient documentation

## 2012-03-21 DIAGNOSIS — J45909 Unspecified asthma, uncomplicated: Secondary | ICD-10-CM | POA: Insufficient documentation

## 2012-03-21 DIAGNOSIS — E785 Hyperlipidemia, unspecified: Secondary | ICD-10-CM | POA: Insufficient documentation

## 2012-03-21 DIAGNOSIS — R110 Nausea: Secondary | ICD-10-CM | POA: Insufficient documentation

## 2012-03-21 DIAGNOSIS — R739 Hyperglycemia, unspecified: Secondary | ICD-10-CM

## 2012-03-21 DIAGNOSIS — Z794 Long term (current) use of insulin: Secondary | ICD-10-CM | POA: Insufficient documentation

## 2012-03-21 DIAGNOSIS — R079 Chest pain, unspecified: Secondary | ICD-10-CM | POA: Insufficient documentation

## 2012-03-21 DIAGNOSIS — Z79899 Other long term (current) drug therapy: Secondary | ICD-10-CM | POA: Insufficient documentation

## 2012-03-21 DIAGNOSIS — E119 Type 2 diabetes mellitus without complications: Secondary | ICD-10-CM | POA: Insufficient documentation

## 2012-03-21 DIAGNOSIS — R1013 Epigastric pain: Secondary | ICD-10-CM | POA: Insufficient documentation

## 2012-03-21 LAB — COMPREHENSIVE METABOLIC PANEL
Albumin: 3.7 g/dL (ref 3.5–5.2)
Alkaline Phosphatase: 74 U/L (ref 39–117)
BUN: 16 mg/dL (ref 6–23)
CO2: 25 mEq/L (ref 19–32)
Chloride: 100 mEq/L (ref 96–112)
Creatinine, Ser: 0.37 mg/dL — ABNORMAL LOW (ref 0.50–1.10)
GFR calc Af Amer: >90 mL/min (ref >90)
GFR calc non Af Amer: >90 mL/min (ref >90)
Glucose, Bld: 358 mg/dL — ABNORMAL HIGH (ref 70–99)
Potassium: 4.3 mEq/L (ref 3.5–5.1)
Total Bilirubin: 0.2 mg/dL — ABNORMAL LOW (ref 0.3–1.2)

## 2012-03-21 LAB — URINALYSIS, ROUTINE W REFLEX MICROSCOPIC
Bilirubin Urine: NEGATIVE
Ketones, ur: 15 mg/dL — AB
Nitrite: NEGATIVE
Protein, ur: NEGATIVE mg/dL
pH: 6 (ref 5.0–8.0)

## 2012-03-21 LAB — CBC WITH DIFFERENTIAL/PLATELET
HCT: 42.6 % (ref 36.0–46.0)
Hemoglobin: 14.9 g/dL (ref 12.0–15.0)
Lymphs Abs: 2.4 10*3/uL (ref 0.7–4.0)
MCHC: 35 g/dL (ref 30.0–36.0)
Monocytes Absolute: 0.5 10*3/uL (ref 0.1–1.0)
Monocytes Relative: 6 % (ref 3–12)
Neutro Abs: 4.4 10*3/uL (ref 1.7–7.7)
Neutrophils Relative %: 60 % (ref 43–77)
RBC: 5.29 MIL/uL — ABNORMAL HIGH (ref 3.87–5.11)

## 2012-03-21 LAB — URINE MICROSCOPIC-ADD ON

## 2012-03-21 LAB — LIPASE, BLOOD: Lipase: 41 U/L (ref 11–59)

## 2012-03-21 MED ORDER — ONDANSETRON HCL 4 MG/2ML IJ SOLN
4.0000 mg | Freq: Once | INTRAMUSCULAR | Status: AC
  Administered 2012-03-21: 4 mg via INTRAVENOUS
  Filled 2012-03-21: qty 2

## 2012-03-21 MED ORDER — SODIUM CHLORIDE 0.9 % IV BOLUS (SEPSIS)
500.0000 mL | Freq: Once | INTRAVENOUS | Status: AC
  Administered 2012-03-21: 500 mL via INTRAVENOUS

## 2012-03-21 MED ORDER — ONDANSETRON 4 MG PO TBDP: 4.0000 mg | ORAL_TABLET | Freq: Three times a day (TID) | ORAL | Status: AC | PRN

## 2012-03-21 MED ORDER — HYDROCODONE-ACETAMINOPHEN 5-325 MG PO TABS: 1.0000 | ORAL_TABLET | Freq: Four times a day (QID) | ORAL | Status: DC | PRN

## 2012-03-21 MED ORDER — OXYCODONE-ACETAMINOPHEN 5-325 MG PO TABS
1.0000 | ORAL_TABLET | Freq: Once | ORAL | Status: AC
  Administered 2012-03-21: 1 via ORAL
  Filled 2012-03-21: qty 1

## 2012-03-21 MED ORDER — IOHEXOL 300 MG/ML  SOLN
20.0000 mL | INTRAMUSCULAR | Status: AC
  Administered 2012-03-21: 20 mL via ORAL

## 2012-03-21 MED ORDER — IOHEXOL 300 MG/ML  SOLN
100.0000 mL | Freq: Once | INTRAMUSCULAR | Status: AC | PRN
  Administered 2012-03-21: 100 mL via INTRAVENOUS

## 2012-03-21 MED ORDER — HYDROMORPHONE HCL PF 1 MG/ML IJ SOLN
1.0000 mg | Freq: Once | INTRAMUSCULAR | Status: AC
  Administered 2012-03-21: 1 mg via INTRAVENOUS
  Filled 2012-03-21: qty 1

## 2012-03-21 MED ORDER — SODIUM CHLORIDE 0.9 % IV SOLN
INTRAVENOUS | Status: DC
  Administered 2012-03-21 (×2): via INTRAVENOUS

## 2012-03-21 NOTE — ED Notes (Signed)
Discharged with family instructions given using the teach back method

## 2012-03-21 NOTE — ED Notes (Signed)
Care transferred, report received Florentina Addison, RN.

## 2012-03-21 NOTE — ED Provider Notes (Signed)
Care of the patient assumed in the CDU from Dr. Effie Shy. Patient presented today with intermittent chest and abdominal pain located the epigastrium/RUQ which has been ongoing for approximately the last 2 weeks. She has been nauseated but has not had any other symptoms with this. She was recently evaluated via the CDU chest pain protocol for her chest pain and was to followup with [facility] cardiology clinic. Her stress echo was nondiagnostic because she did not reach the target heart rate but this was felt to be less consistent with CAD per history.  On exam, the patient is noted to have mild tenderness to the left side of her abdomen. There are no peritoneal signs. Her vitals are stable. Her lab work was largely unremarkable. She did have a negative abdominal ultrasound. She was moved to CDU awaiting a CT of her abdomen and pelvis to rule out acute pathology. This has now resulted and appears reassuring. We will plan to discharge the patient home to followup with her PCP. Small prescriptions for Norco and Zofran given. Reasons to return to the emergency department discussed.  Grant Fontana, PA-C 03/21/12 1137

## 2012-03-21 NOTE — ED Notes (Signed)
1st cup of contrast finished

## 2012-03-21 NOTE — ED Notes (Signed)
Mid-abd pain since 1800 nausea no vomiting or diarrhea.  Pt sleeping soundly and so is her family at the bedside.  No acute distress.  lmp hysterectomy.

## 2012-03-21 NOTE — ED Notes (Signed)
Assisted to the restroom.

## 2012-03-21 NOTE — ED Notes (Signed)
Pt drinking oral contrast for CT exam

## 2012-03-21 NOTE — ED Notes (Signed)
Back from CT

## 2012-03-21 NOTE — ED Notes (Signed)
Pt was seen here 2 weeks ago for sternal chest pain and discharged with dx of costochondritis.  Wed pt began exp epigastric pain that radiates to her back.  Pt c/o nausea but denies emesis, or changes in bowel or bladder habits.  Pt appears in great pain.

## 2012-03-21 NOTE — ED Notes (Signed)
Drinking oral contrast 

## 2012-03-21 NOTE — ED Notes (Signed)
Med given to [place]

## 2012-03-21 NOTE — ED Provider Notes (Signed)
History     CSN: [id number]  Arrival date & time 03/21/12  0056   First MD Initiated Contact with Patient 03/21/12 [phone]      Chief Complaint  Patient presents with  . Abdominal Pain    (Consider location/radiation/quality/duration/timing/severity/associated sxs/prior treatment) HPI Comments: Marissa Barrett is a 36 y.o. Female with ongoing right chest and right upper abdominal pain for 2 weeks. The pain has worsened and become constant in the last 3 days. She has nausea without vomiting, or change in bowel habits. She denies fever, cough, chest pain, weakness, or dizziness. The pain radiates to the right flank. She was seen in the ED for chest pain. About 10 days ago, and ultimately disposition, through CDU with chest pain, evaluation, and cardiology consultation;  Cardiology felt that there was no risk for coronary artery disease. There are no known aggravating or palliative factors.  Patient is a 36 y.o. female presenting with abdominal pain. The history is provided by the patient.  Abdominal Pain The primary symptoms of the illness include abdominal pain.    Past Medical History  Diagnosis Date  . Interstitial cystitis   . Asthma   . Diabetes mellitus   . HLD (hyperlipidemia)     Past Surgical History  Procedure Date  . Abdominal hysterectomy     Family History  Problem Relation Age of Onset  . Heart disease      No known family history    History  Substance Use Topics  . Smoking status: Never Smoker   . Smokeless tobacco: Never Used  . Alcohol Use: Yes     rarely    OB History    Grav Para Term Preterm Abortions TAB SAB Ect Mult Living                  Review of Systems  Gastrointestinal: Positive for abdominal pain.  All other systems reviewed and are negative.    Allergies  Review of patient's allergies indicates no known allergies.  Home Medications   Current Outpatient Rx  Name Route Sig Dispense Refill  . ALBUTEROL SULFATE HFA 108 (90 BASE)  MCG/ACT IN AERS Inhalation Inhale 2 puffs into the lungs every 6 (six) hours as needed. Wheezing or shortness of breath    . FLUCONAZOLE 150 MG PO TABS  Take one every 3 days for 3 doses, then take every week for up to 6 months 30 tablet 0  . INSULIN ASPART PROT & ASPART (70-30) 100 UNIT/ML [place] SUSP Subcutaneous Inject 80 Units into the skin 2 (two) times daily with a meal. 40 mL 12    Four 10 ml vials.  Also supply needed syringes a ...  . LISINOPRIL 10 MG PO TABS Oral Take 1 tablet (10 mg total) by mouth daily. 30 tablet 5  . OXYCODONE-ACETAMINOPHEN 5-325 MG PO TABS Oral Take 1 tablet by mouth every 4 (four) hours as needed. Pain from interstitial cyctitis    . SIMVASTATIN 20 MG PO TABS Oral Take 20 mg by mouth every evening.    [name] Kitchen BLOOD GLUCOSE METER KIT  Use as instructed 1 each 0    Supply sufficient test strips to test twice daily, ...  . HYDROCODONE-ACETAMINOPHEN 5-325 MG PO TABS Oral Take 1 tablet by mouth every 6 (six) hours as needed for pain. 12 tablet 0  . ONDANSETRON 4 MG PO TBDP Oral Take 1 tablet (4 mg total) by mouth every 8 (eight) hours as needed for nausea. 20 tablet 0  BP 120/71  Pulse 81  Temp 97.7 F (36.5 C) (Oral)  Resp 18  Ht 5\' 4"  (1.626 m)  Wt 230 lb (104.327 kg)  BMI 39.48 kg/m2  SpO2 94%  LMP 02/15/2003  Physical Exam  Nursing note and vitals reviewed. Constitutional: She is oriented to person, place, and time. She appears well-developed.       She is obese  HENT:  Head: Normocephalic and atraumatic.  Eyes: Conjunctivae and EOM are normal. Pupils are equal, round, and reactive to light.  Neck: Normal range of motion and phonation normal. Neck supple.  Cardiovascular: Normal rate, regular rhythm and intact distal pulses.   Pulmonary/Chest: Effort normal and breath sounds normal. She exhibits no tenderness.  Abdominal: Soft. Bowel sounds are normal. She exhibits no distension (moderate right upper quadrant tenderness, and mild, right lower quadrant  tenderness) and no mass. There is tenderness. There is no guarding.       Vertical midline subumbilical scar. No palpable hernia  Musculoskeletal: Normal range of motion.  Neurological: She is alert and oriented to person, place, and time. She has normal strength. She exhibits normal muscle tone.  Skin: Skin is warm and dry.  Psychiatric: She has a normal mood and affect. Her behavior is normal. Judgment and thought content normal.    ED Course  Procedures (including critical care time) Emergency department treatment: IV fluids. IV, Dilaudid, and Zofran.  Abdominal ultrasound returned without findings. Since she had low abdominal pain, as well; she was sent for a CT abdomen.  Patient transferred to CDU, case discussed with Grant Fontana.  Labs Reviewed  CBC WITH DIFFERENTIAL - Abnormal; Notable for the following:    RBC 5.29 (*)     All other components within normal limits  COMPREHENSIVE METABOLIC PANEL - Abnormal; Notable for the following:    Glucose, Bld 358 (*)     Creatinine, Ser 0.37 (*)     Total Bilirubin 0.2 (*)     All other components within normal limits  URINALYSIS, ROUTINE W REFLEX MICROSCOPIC - Abnormal; Notable for the following:    Specific Gravity, Urine 1.034 (*)     Glucose, UA >1000 (*)     Ketones, ur 15 (*)     All other components within normal limits  URINE MICROSCOPIC-ADD ON - Abnormal; Notable for the following:    Squamous Epithelial / LPF FEW (*)     All other components within normal limits  LIPASE, BLOOD  PREGNANCY, URINE  LAB REPORT - SCANNED   US Abdomen Complete  03/21/2012  *RADIOLOGY REPORT*  Clinical Data:  Pain and nausea.  COMPLETE ABDOMINAL ULTRASOUND  Comparison:  None.  Findings:  Gallbladder:  No gallstones, gallbladder wall thickening, or pericholecystic fluid.  Common bile duct:  Normal caliber with measured diameter of 3.9 mm.  Liver:  No focal lesion identified.  Within normal limits in parenchymal echogenicity.  IVC:  Appears  normal.  Pancreas:  Pancreas is not well visualized due to overlying bowel gas.  Spleen:  Spleen length measures 6.3 cm.  Normal parenchymal echotexture.  Right Kidney:  Right kidney measures 11.7 cm length.  No hydronephrosis.  Left Kidney:  Left kidney measures 13 x 3 cm length.  No hydronephrosis.  Abdominal aorta:  Some portions obscured.  Visualized abdominal aorta is normal caliber.  IMPRESSION: Negative abdominal ultrasound.   Original Report Authenticated By: Marlon Pel, M.D.    Ct Abdomen Pelvis W Contrast  03/21/2012  *RADIOLOGY REPORT*  Clinical Data: Epigastric  abdominal pain  CT ABDOMEN AND PELVIS WITH CONTRAST  Technique:  Multidetector CT imaging of the abdomen and pelvis was performed following the standard protocol during bolus administration of intravenous contrast.  Contrast: OMNIPAQUE IOHEXOL 300 MG/ML  SOLN  Comparison: Abdominal ultrasound - 03/21/2012  Findings:  Examination is slightly degraded secondary to patient body habitus.  Normal hepatic contour.  No discrete hepatic lesions.  Normal appearance of the gallbladder.  No definite intra or extrahepatic biliary duct dilatation.  No ascites.  There is symmetric enhancement the bilateral kidneys.  No discrete renal lesions.  No definite renal stones.  No urinary obstruction. No perinephric stranding. Normal appearance of the urinary bladder.  Normal appearance of the bilateral adrenal glands, pancreas and spleen.  Incidental note is made of a small splenule.  Ingested enteric contrast extends to the level of the distal small bowel.  Moderate colonic stool burden without evidence of obstruction.  The cecum is incidentally noted to be within the left lower abdominal quadrant.  The appendix is not definitely identified, though however there is no inflammatory change adjacent to the cecum.  No pneumoperitoneum, pneumatosis or portal venous gas.  Normal caliber of the abdominal aorta.  The major branch vessels of the abdominal  aorta are patent.  Prominent bilateral external iliac chain lymph nodes are present with reactive in etiology with index left-sided node measuring 1.0 cm in short axis diameter (image 80, series 2), not enlarged by CT criteria.  No definite retroperitoneal or mesenteric adenopathy.  Symmetric bilateral inguinal lymph nodes are presumably reactive in etiology.  Post hysterectomy.  No discrete adnexal mass.  No free fluid in the pelvis.  Limited visualization of the lower thorax demonstrates minimal bibasilar opacities favored to represent atelectasis.  No focal air space opacities.  No pleural effusion.  Normal heart size.  No pericardial effusion.  No acute or aggressive osseous abnormalities. Postsurgical change of the midline of the lower abdominal wall.  IMPRESSION:  1.  No explanation for patient's intermittent abdominal pain. 2.  Moderate colonic stool burden without evidence of obstruction.  3.  The cecum is incidentally noted to be within the left lower abdominal quadrant.  This finding without evidence of obstruction or pericecal stranding, however in the absence of prior examinations is of indeterminate chronicity.   Original Report Authenticated By: Waynard Reeds, M.D.      1. Abdominal pain   2. Hyperglycemia       MDM  Nonspecific abdominal pain. Doubt ACS, PE, pneumonia, or serious bacterial infection.      Sent to CDU to complete CT A/P prior to d/c.     Flint Melter, MD 03/22/12 2007

## 2012-03-22 NOTE — ED Provider Notes (Signed)
Medical screening examination/treatment/procedure(s) were performed by non-physician practitioner and as supervising physician I was immediately available for consultation/collaboration.   Lyanne Co, MD 03/22/12 [phone]

## 2012-03-28 ENCOUNTER — Encounter (HOSPITAL_COMMUNITY): Payer: Self-pay | Admitting: *Deleted

## 2012-03-28 ENCOUNTER — Emergency Department (HOSPITAL_COMMUNITY)
Admission: EM | Admit: 2012-03-28 | Discharge: 2012-03-29 | Disposition: A | Discharge status: Home / Self Care | Payer: Medicaid Other | Attending: Emergency Medicine | Admitting: Emergency Medicine

## 2012-03-28 DIAGNOSIS — R079 Chest pain, unspecified: Secondary | ICD-10-CM | POA: Insufficient documentation

## 2012-03-28 DIAGNOSIS — R739 Hyperglycemia, unspecified: Secondary | ICD-10-CM

## 2012-03-28 DIAGNOSIS — J45909 Unspecified asthma, uncomplicated: Secondary | ICD-10-CM | POA: Insufficient documentation

## 2012-03-28 DIAGNOSIS — E119 Type 2 diabetes mellitus without complications: Secondary | ICD-10-CM | POA: Insufficient documentation

## 2012-03-28 DIAGNOSIS — R0781 Pleurodynia: Secondary | ICD-10-CM

## 2012-03-28 DIAGNOSIS — E785 Hyperlipidemia, unspecified: Secondary | ICD-10-CM | POA: Insufficient documentation

## 2012-03-28 DIAGNOSIS — Z794 Long term (current) use of insulin: Secondary | ICD-10-CM | POA: Insufficient documentation

## 2012-03-28 DIAGNOSIS — Z79899 Other long term (current) drug therapy: Secondary | ICD-10-CM | POA: Insufficient documentation

## 2012-03-28 NOTE — ED Notes (Signed)
Patient with low back pain radiates to her right side.  Patient states that pain has been going on for about 4 weeks but the past week it has been getting worse.  Patient was seen here last week for same.

## 2012-03-29 ENCOUNTER — Emergency Department (HOSPITAL_COMMUNITY): Payer: Medicaid Other

## 2012-03-29 LAB — CBC WITH DIFFERENTIAL/PLATELET
Eosinophils Absolute: 0.1 10*3/uL (ref 0.0–0.7)
HCT: 41 % (ref 36.0–46.0)
Hemoglobin: 14.4 g/dL (ref 12.0–15.0)
Lymphs Abs: 2.7 10*3/uL (ref 0.7–4.0)
MCH: 28 pg (ref 26.0–34.0)
Monocytes Absolute: 0.4 10*3/uL (ref 0.1–1.0)
Monocytes Relative: 7 % (ref 3–12)
Neutro Abs: 3 10*3/uL (ref 1.7–7.7)
Neutrophils Relative %: 48 % (ref 43–77)
RBC: 5.14 MIL/uL — ABNORMAL HIGH (ref 3.87–5.11)

## 2012-03-29 LAB — BASIC METABOLIC PANEL
BUN: 12 mg/dL (ref 6–23)
Calcium: 9.3 mg/dL (ref 8.4–10.5)
Chloride: 102 mEq/L (ref 96–112)
Creatinine, Ser: 0.32 mg/dL — ABNORMAL LOW (ref 0.50–1.10)
GFR calc Af Amer: >90 mL/min (ref >90)

## 2012-03-29 LAB — D-DIMER, QUANTITATIVE: D-Dimer, Quant: <0.27 ug/mL-FEU (ref 0.00–0.48)

## 2012-03-29 MED ORDER — OXYCODONE-ACETAMINOPHEN 5-325 MG PO TABS: 2.0000 | ORAL_TABLET | ORAL | Status: AC | PRN

## 2012-03-29 MED ORDER — HYDROMORPHONE HCL PF 1 MG/ML IJ SOLN
1.0000 mg | Freq: Once | INTRAMUSCULAR | Status: AC
  Administered 2012-03-29: 1 mg via INTRAVENOUS
  Filled 2012-03-29: qty 1

## 2012-03-29 MED ORDER — SODIUM CHLORIDE 0.9 % IV BOLUS (SEPSIS)
1000.0000 mL | Freq: Once | INTRAVENOUS | Status: AC
  Administered 2012-03-29: 1000 mL via INTRAVENOUS

## 2012-03-29 MED ORDER — DIAZEPAM 5 MG PO TABS: 5.0000 mg | ORAL_TABLET | Freq: Three times a day (TID) | ORAL | Status: DC | PRN

## 2012-03-29 MED ORDER — ONDANSETRON HCL 4 MG/2ML IJ SOLN
4.0000 mg | Freq: Once | INTRAMUSCULAR | Status: AC
  Administered 2012-03-29: 4 mg via INTRAVENOUS
  Filled 2012-03-29: qty 2

## 2012-03-29 NOTE — ED Notes (Signed)
Pt for discharge.Vital signs stable and GCS 15.But still complains of pain.

## 2012-03-29 NOTE — ED Notes (Signed)
PT TRANSPORTED TO RADIOLOGY.

## 2012-03-29 NOTE — ED Notes (Signed)
DR. Norlene Campbell AT BEDSIDE .

## 2012-03-29 NOTE — ED Provider Notes (Signed)
History     CSN: [id number]  Arrival date & time 03/28/12  2218   First MD Initiated Contact with Patient 03/29/12 0031      Chief Complaint  Patient presents with  . Back Pain    (Consider location/radiation/quality/duration/timing/severity/associated sxs/prior treatment) HPI 36 year old female presents to emergency room complaining of right-sided chest pain radiating into her back. Patient has had 3 recent ED visits for similar pain, without specific diagnosis. Patient reports pain initially was substernal, and she was placed in the CDU on the chest pain protocol. This workup was negative. Patient then returned later complaint of pain in her right chest and right abdomen, ultrasound and CT of abdomen were negative. Patient indicates at this time that her pain is right lower costal margin wrapping around to her back. She is point tender over her ribs in this area. She reports the pain is severe 10/10 and constant. No worsening or alleviating factors. No trauma to the area, no overlying skin changes or rash. No history of shingles. She reports her sugars have remained high in the 300s. She is on a fever, no cough, no nausea or vomiting. She has had some diarrhea. No history of ear normal bowel syndrome, no history of similar symptoms in the past  Past Medical History  Diagnosis Date  . Interstitial cystitis   . Asthma   . Diabetes mellitus   . HLD (hyperlipidemia)     Past Surgical History  Procedure Date  . Abdominal hysterectomy     Family History  Problem Relation Age of Onset  . Heart disease      No known family history    History  Substance Use Topics  . Smoking status: Never Smoker   . Smokeless tobacco: Never Used  . Alcohol Use: Yes     rarely    OB History    Grav Para Term Preterm Abortions TAB SAB Ect Mult Living                  Review of Systems  All other systems reviewed and are negative.    Allergies  Kiwi extract  Home Medications    Current Outpatient Rx  Name Route Sig Dispense Refill  . ACETAMINOPHEN 500 MG PO TABS Oral Take 1,000-1,500 mg by mouth every 6 (six) hours as needed. For pain    . ALBUTEROL SULFATE HFA 108 (90 BASE) MCG/ACT IN AERS Inhalation Inhale 2 puffs into the lungs every 6 (six) hours as needed. Wheezing or shortness of breath    . FLUCONAZOLE 150 MG PO TABS Oral Take 150 mg by mouth once a week. On Tuesdays    . INSULIN ASPART PROT & ASPART (70-30) 100 UNIT/ML [place] SUSP Subcutaneous Inject 80 Units into the skin 2 (two) times daily with a meal. 40 mL 12    Four 10 ml vials.  Also supply needed syringes a ...  . LISINOPRIL 10 MG PO TABS Oral Take 1 tablet (10 mg total) by mouth daily. 30 tablet 5  . ONDANSETRON 4 MG PO TBDP Oral Take 1 tablet (4 mg total) by mouth every 8 (eight) hours as needed for nausea. 20 tablet 0  . SIMVASTATIN 20 MG PO TABS Oral Take 20 mg by mouth every evening.    [name] Kitchen DIAZEPAM 5 MG PO TABS Oral Take 1 tablet (5 mg total) by mouth every 8 (eight) hours as needed (muscle spasm). 15 tablet 0  . OXYCODONE-ACETAMINOPHEN 5-325 MG PO TABS Oral Take 2 tablets by  mouth every 4 (four) hours as needed for pain. 20 tablet 0    BP 125/81  Pulse 88  Temp 97.7 F (36.5 C) (Oral)  Resp 16  SpO2 99%  LMP 02/15/2003  Physical Exam  Nursing note and vitals reviewed. Constitutional: She is oriented to person, place, and time. She appears well-developed and well-nourished. She appears distressed.       Obese, tearful, upset, uncomfortable appearing  HENT:  Head: Normocephalic and atraumatic.  Right Ear: External ear normal.  Left Ear: External ear normal.  Nose: Nose normal.  Mouth/Throat: Oropharynx is clear and moist.  Eyes: Conjunctivae normal and EOM are normal. Pupils are equal, round, and reactive to light.  Neck: Normal range of motion. Neck supple. No JVD present. No tracheal deviation present. No thyromegaly present.  Cardiovascular: Normal rate, regular rhythm, normal heart  sounds and intact distal pulses.  Exam reveals no gallop and no friction rub.   No murmur heard. Pulmonary/Chest: Effort normal and breath sounds normal. No stridor. No respiratory distress. She has no wheezes. She has no rales. She exhibits tenderness (tenderness along right lower chest and along 10th rib along its entire course).  Abdominal: Soft. Bowel sounds are normal. She exhibits no distension and no mass. There is no tenderness. There is no rebound and no guarding.  Musculoskeletal: Normal range of motion. She exhibits no edema and no tenderness.  Lymphadenopathy:    She has no cervical adenopathy.  Neurological: She is alert and oriented to person, place, and time. She exhibits normal muscle tone.  Skin: Skin is warm and dry. No rash noted. No erythema. No pallor.  Psychiatric: She has a normal mood and affect. Her behavior is normal. Judgment and thought content normal.    ED Course  Procedures (including critical care time)  Labs Reviewed  CBC WITH DIFFERENTIAL - Abnormal; Notable for the following:    RBC 5.14 (*)     All other components within normal limits  BASIC METABOLIC PANEL - Abnormal; Notable for the following:    Glucose, Bld 274 (*)     Creatinine, Ser 0.32 (*)     All other components within normal limits  SEDIMENTATION RATE  D-DIMER, QUANTITATIVE  LAB REPORT - SCANNED   Dg Abd Acute W/chest  03/29/2012  *RADIOLOGY REPORT*  Clinical Data: Chest and back pain.  ACUTE ABDOMEN SERIES (ABDOMEN 2 VIEW & CHEST 1 VIEW)  Comparison: 03/09/2012 chest radiograph, 03/21/2012 abdominal CT  Findings: Lungs remain clear.  Cardiomediastinal contours unchanged.  No free intraperitoneal air. The bowel gas pattern is non- obstructive. Organ outlines are normal where seen. No acute or aggressive osseous abnormality identified.  Linear metallic density projecting over the right sacrum may correspond to a neurostimulator.  IMPRESSION: Nonobstructive bowel gas pattern.   Original Report  Authenticated By: Waneta Martins, M.D.      1. Rib pain on right side   2. Hyperglycemia       MDM  36 year old female with ongoing pain. Patient and husband are frustrated with multiple visits to the emergency room without a diagnosis, they feel they're being marginalized as patient does not have insurance. Patient has had a fairly thorough workup thus far without a specific cause for her symptoms. We'll get repeat chest and abdomen x-rays, d-dimer for possible PE, sedimentation rate. Will treat pain. Patient counseled that we may not be able to come up with a diagnosis here in the emergency department but close followup with a primary care Dr. would  be important for further workup.        Olivia Mackie, MD 03/29/12 [phone]

## 2012-04-05 ENCOUNTER — Emergency Department (HOSPITAL_COMMUNITY): Payer: Medicaid Other

## 2012-04-05 ENCOUNTER — Encounter (HOSPITAL_COMMUNITY): Payer: Self-pay | Admitting: *Deleted

## 2012-04-05 ENCOUNTER — Emergency Department (HOSPITAL_COMMUNITY)
Admission: EM | Admit: 2012-04-05 | Discharge: 2012-04-06 | Disposition: A | Discharge status: Home / Self Care | Payer: Medicaid Other | Attending: Emergency Medicine | Admitting: Emergency Medicine

## 2012-04-05 DIAGNOSIS — R197 Diarrhea, unspecified: Secondary | ICD-10-CM | POA: Insufficient documentation

## 2012-04-05 DIAGNOSIS — E119 Type 2 diabetes mellitus without complications: Secondary | ICD-10-CM | POA: Insufficient documentation

## 2012-04-05 DIAGNOSIS — R112 Nausea with vomiting, unspecified: Secondary | ICD-10-CM | POA: Insufficient documentation

## 2012-04-05 DIAGNOSIS — R109 Unspecified abdominal pain: Secondary | ICD-10-CM | POA: Insufficient documentation

## 2012-04-05 DIAGNOSIS — Z79899 Other long term (current) drug therapy: Secondary | ICD-10-CM | POA: Insufficient documentation

## 2012-04-05 LAB — URINE MICROSCOPIC-ADD ON

## 2012-04-05 LAB — CBC WITH DIFFERENTIAL/PLATELET
Basophils Absolute: 0 10*3/uL (ref 0.0–0.1)
Basophils Relative: 0 % (ref 0–1)
Eosinophils Relative: 1 % (ref 0–5)
HCT: 43.6 % (ref 36.0–46.0)
Lymphocytes Relative: 41 % (ref 12–46)
MCHC: 35.6 g/dL (ref 30.0–36.0)
Monocytes Absolute: 0.5 10*3/uL (ref 0.1–1.0)
Neutro Abs: 3.3 10*3/uL (ref 1.7–7.7)
Platelets: 290 10*3/uL (ref 150–400)
RDW: 12.2 % (ref 11.5–15.5)
WBC: 6.5 10*3/uL (ref 4.0–10.5)

## 2012-04-05 LAB — COMPREHENSIVE METABOLIC PANEL
ALT: 19 U/L (ref 0–35)
AST: 15 U/L (ref 0–37)
Albumin: 3.4 g/dL — ABNORMAL LOW (ref 3.5–5.2)
CO2: 21 mEq/L (ref 19–32)
Calcium: 9.3 mg/dL (ref 8.4–10.5)
Chloride: 96 mEq/L (ref 96–112)
Creatinine, Ser: 0.32 mg/dL — ABNORMAL LOW (ref 0.50–1.10)
Sodium: 133 mEq/L — ABNORMAL LOW (ref 135–145)

## 2012-04-05 LAB — URINALYSIS, ROUTINE W REFLEX MICROSCOPIC
Glucose, UA: >1000 mg/dL — AB
Hgb urine dipstick: NEGATIVE
Leukocytes, UA: NEGATIVE
pH: 6.5 (ref 5.0–8.0)

## 2012-04-05 LAB — GLUCOSE, CAPILLARY: Glucose-Capillary: 234 mg/dL — ABNORMAL HIGH (ref 70–99)

## 2012-04-05 LAB — PREGNANCY, URINE: Preg Test, Ur: NEGATIVE

## 2012-04-05 MED ORDER — SODIUM CHLORIDE 0.9 % IV BOLUS (SEPSIS)
1000.0000 mL | Freq: Once | INTRAVENOUS | Status: AC
  Administered 2012-04-05: 1000 mL via INTRAVENOUS

## 2012-04-05 MED ORDER — METOCLOPRAMIDE HCL 10 MG PO TABS: 10.0000 mg | ORAL_TABLET | Freq: Four times a day (QID) | ORAL | Status: DC

## 2012-04-05 MED ORDER — HYDROCODONE-ACETAMINOPHEN 5-500 MG PO TABS: 1.0000 | ORAL_TABLET | Freq: Four times a day (QID) | ORAL | Status: DC | PRN

## 2012-04-05 MED ORDER — ONDANSETRON HCL 4 MG/2ML IJ SOLN
4.0000 mg | Freq: Once | INTRAMUSCULAR | Status: AC
  Administered 2012-04-05: 4 mg via INTRAVENOUS
  Filled 2012-04-05: qty 2

## 2012-04-05 MED ORDER — PANTOPRAZOLE SODIUM 40 MG IV SOLR
40.0000 mg | Freq: Once | INTRAVENOUS | Status: AC
  Administered 2012-04-05: 40 mg via INTRAVENOUS
  Filled 2012-04-05: qty 40

## 2012-04-05 MED ORDER — GI COCKTAIL ~~LOC~~
30.0000 mL | Freq: Once | ORAL | Status: AC
  Administered 2012-04-05: 30 mL via ORAL
  Filled 2012-04-05 (×2): qty 30

## 2012-04-05 MED ORDER — SUCRALFATE 1 G PO TABS
1.0000 g | ORAL_TABLET | Freq: Once | ORAL | Status: AC
  Administered 2012-04-05: 1 g via ORAL
  Filled 2012-04-05: qty 1

## 2012-04-05 MED ORDER — METOCLOPRAMIDE HCL 5 MG/ML IJ SOLN
10.0000 mg | Freq: Once | INTRAMUSCULAR | Status: AC
  Administered 2012-04-05: 10 mg via INTRAVENOUS
  Filled 2012-04-05: qty 2

## 2012-04-05 MED ORDER — HYDROMORPHONE HCL PF 1 MG/ML IJ SOLN
1.0000 mg | Freq: Once | INTRAMUSCULAR | Status: AC
  Administered 2012-04-05: 1 mg via INTRAVENOUS
  Filled 2012-04-05: qty 1

## 2012-04-05 NOTE — ED Provider Notes (Signed)
History     CSN: [id number]  Arrival date & time 04/05/12  1753   First MD Initiated Contact with Patient 04/05/12 1924      Chief Complaint  Patient presents with  . Abdominal Pain  . Nausea  . Emesis  . Diarrhea    (Consider location/radiation/quality/duration/timing/severity/associated sxs/prior treatment) Patient is a 36 y.o. female presenting with abdominal pain. The history is provided by the patient.  Abdominal Pain The primary symptoms of the illness include abdominal pain, nausea, vomiting and diarrhea. The primary symptoms of the illness do not include fever, hematemesis, dysuria, vaginal discharge or vaginal bleeding. The current episode started more than 2 days ago. The onset of the illness was gradual. The problem has been gradually worsening.  Additional symptoms associated with the illness include anorexia. Symptoms associated with the illness do not include diaphoresis, constipation, urgency, hematuria or back pain.  Pt states she has now been sick with similar symptoms on and off for over 30days. States has had [place], CT abdomen/pelvis, x-rays, labs, and no one could find the cause of her symptoms. Pt this time pain worsens 3 days ago, reports epigastric abdominal pain, nausea, vomiting, unable to keep anything down, also watery diarrhea, about 5 times a day.   Past Medical History  Diagnosis Date  . Interstitial cystitis   . Asthma   . Diabetes mellitus   . HLD (hyperlipidemia)     Past Surgical History  Procedure Date  . Abdominal hysterectomy     Family History  Problem Relation Age of Onset  . Heart disease      No known family history    History  Substance Use Topics  . Smoking status: Never Smoker   . Smokeless tobacco: Never Used  . Alcohol Use: No     rarely    OB History    Grav Para Term Preterm Abortions TAB SAB Ect Mult Living                  Review of Systems  Constitutional: Negative for fever and diaphoresis.  HENT: Negative for  neck pain and neck stiffness.   Respiratory: Negative.   Cardiovascular: Negative.   Gastrointestinal: Positive for nausea, vomiting, abdominal pain, diarrhea and anorexia. Negative for constipation, blood in stool and hematemesis.  Genitourinary: Negative for dysuria, urgency, hematuria, flank pain, vaginal bleeding and vaginal discharge.  Musculoskeletal: Negative for back pain.  Skin: Negative.   Neurological: Positive for weakness. Negative for dizziness and numbness.    Allergies  Kiwi extract  Home Medications   Current Outpatient Rx  Name Route Sig Dispense Refill  . ACETAMINOPHEN 500 MG PO TABS Oral Take 1,000-1,500 mg by mouth every 6 (six) hours as needed. For pain    . ALBUTEROL SULFATE HFA 108 (90 BASE) MCG/ACT IN AERS Inhalation Inhale 2 puffs into the lungs every 6 (six) hours as needed. Wheezing or shortness of breath    . DIAZEPAM 5 MG PO TABS Oral Take 5 mg by mouth every 8 (eight) hours as needed. For muscle spasms    . FLUCONAZOLE 150 MG PO TABS Oral Take 150 mg by mouth once a week. On Tuesdays    . INSULIN ASPART PROT & ASPART (70-30) 100 UNIT/ML [place] SUSP Subcutaneous Inject 80 Units into the skin 2 (two) times daily with a meal. 40 mL 12    Four 10 ml vials.  Also supply needed syringes a ...  . LISINOPRIL 10 MG PO TABS Oral Take 1 tablet (  10 mg total) by mouth daily. 30 tablet 5  . OXYCODONE-ACETAMINOPHEN 5-325 MG PO TABS Oral Take 2 tablets by mouth every 4 (four) hours as needed for pain. 20 tablet 0  . SIMVASTATIN 20 MG PO TABS Oral Take 20 mg by mouth every evening.      BP 137/82  Pulse 86  Temp 98.4 F (36.9 C) (Oral)  Resp 18  Ht 5\' 4"  (1.626 m)  Wt 230 lb (104.327 kg)  BMI 39.48 kg/m2  SpO2 99%  LMP 02/15/2003  Physical Exam  Nursing note and vitals reviewed. Constitutional: She appears well-developed and well-nourished. No distress.  HENT:  Head: Normocephalic.  Eyes: Conjunctivae normal are normal.  Neck: Neck supple.  Cardiovascular:  Normal rate, regular rhythm and normal heart sounds.   Pulmonary/Chest: Effort normal and breath sounds normal. No respiratory distress. She has no wheezes. She has no rales.  Abdominal: Soft. Bowel sounds are normal. She exhibits no distension. There is tenderness. There is no rebound.       Epigastric and left upper abdominal tenderness  Musculoskeletal: Normal range of motion. She exhibits no edema.  Neurological: She is alert.  Skin: Skin is warm and dry.    ED Course  Procedures (including critical care time)  PT with multiple evauations for the same. Has had recent CT abdomen, US abdomen, x-ray, labs. All unremarkable other than hyperglycemia. Will get labs, fluids, will try gi coctail, protonix.   Results for orders placed during the hospital encounter of 04/05/12  CBC WITH DIFFERENTIAL      Component Value Range   WBC 6.5  4.0 - 10.5 K/uL   RBC 5.46 (*) 3.87 - 5.11 MIL/uL   Hemoglobin 15.5 (*) 12.0 - 15.0 g/dL   HCT 47.8  29.5 - 62.1 %   MCV 79.9  78.0 - 100.0 fL   MCH 28.4  26.0 - 34.0 pg   MCHC 35.6  30.0 - 36.0 g/dL   RDW 30.8  65.7 - 84.6 %   Platelets 290  150 - 400 K/uL   Neutrophils Relative 51  43 - 77 %   Neutro Abs 3.3  1.7 - 7.7 K/uL   Lymphocytes Relative 41  12 - 46 %   Lymphs Abs 2.6  0.7 - 4.0 K/uL   Monocytes Relative 7  3 - 12 %   Monocytes Absolute 0.5  0.1 - 1.0 K/uL   Eosinophils Relative 1  0 - 5 %   Eosinophils Absolute 0.1  0.0 - 0.7 K/uL   Basophils Relative 0  0 - 1 %   Basophils Absolute 0.0  0.0 - 0.1 K/uL  COMPREHENSIVE METABOLIC PANEL      Component Value Range   Sodium 133 (*) 135 - 145 mEq/L   Potassium 3.3 (*) 3.5 - 5.1 mEq/L   Chloride 96  96 - 112 mEq/L   CO2 21  19 - 32 mEq/L   Glucose, Bld 333 (*) 70 - 99 mg/dL   BUN 9  6 - 23 mg/dL   Creatinine, Ser 9.62 (*) 0.50 - 1.10 mg/dL   Calcium 9.3  8.4 - 95.2 mg/dL   Total Protein 7.5  6.0 - 8.3 g/dL   Albumin 3.4 (*) 3.5 - 5.2 g/dL   AST 15  0 - 37 U/L   ALT 19  0 - 35 U/L    Alkaline Phosphatase 71  39 - 117 U/L   Total Bilirubin 0.3  0.3 - 1.2 mg/dL   GFR calc  non Af Amer >90  >90 mL/min   GFR calc Af Amer >90  >90 mL/min  LIPASE, BLOOD      Component Value Range   Lipase 51  11 - 59 U/L  URINALYSIS, ROUTINE W REFLEX MICROSCOPIC      Component Value Range   Color, Urine YELLOW  YELLOW   APPearance CLEAR  CLEAR   Specific Gravity, Urine 1.045 (*) 1.005 - 1.030   pH 6.5  5.0 - 8.0   Glucose, UA >1000 (*) NEGATIVE mg/dL   Hgb urine dipstick NEGATIVE  NEGATIVE   Bilirubin Urine NEGATIVE  NEGATIVE   Ketones, ur NEGATIVE  NEGATIVE mg/dL   Protein, ur NEGATIVE  NEGATIVE mg/dL   Urobilinogen, UA 1.0  0.0 - 1.0 mg/dL   Nitrite NEGATIVE  NEGATIVE   Leukocytes, UA NEGATIVE  NEGATIVE  PREGNANCY, URINE      Component Value Range   Preg Test, Ur NEGATIVE  NEGATIVE  URINE MICROSCOPIC-ADD ON      Component Value Range   Squamous Epithelial / LPF FEW (*) RARE   WBC, UA 0-2  <3 WBC/hpf   Bacteria, UA FEW (*) RARE  GLUCOSE, CAPILLARY      Component Value Range   Glucose-Capillary 234 (*) 70 - 99 mg/dL     Dg Abd Acute W/chest  04/05/2012  *RADIOLOGY REPORT*  Clinical Data: Nausea, vomiting, diarrhea.  ACUTE ABDOMEN SERIES (ABDOMEN 2 VIEW & CHEST 1 VIEW)  Comparison: None.  Findings: Lungs clear.  Cardiopericardial silhouette within normal limits.  Trachea midline.  No airspace disease or effusion. Bowel gas pattern is within normal limits.  No pathologic air fluid levels are identified.   Stool and bowel gas present in the rectosigmoid.  Radiopaque density over the right sacral ala presumably represents an old stimulator lead. No interval change from prior.  IMPRESSION: No acute abnormality. Normal bowel gas pattern.   Original Report Authenticated By: Andreas Newport, M.D.     Pt's labs unremrakable other than CBG of 333. Fluids running, no improvement with GI cocktail or protonix. Tried dilaudid and reglan. Will reassess.    12:30 AM Pt is feeling much better.  She is tolerating ginger ale. Her abdomen is soft, no peritoneal signs. Pt Has had recent CT, do not think there is a need to re scan pt given similar symptoms and no acute abdomen on exam. Discussed follow up with pt and that she may need to see a GI specialist. She is feeling well enough to go home at this time, with reglan and pain medications. i referred her to GI for further evaluation.   Diagnosis: Nausea vomiting Abdominal pain    MDM  See above        Lottie Mussel, PA 04/06/12 0120

## 2012-04-05 NOTE — ED Notes (Signed)
Pt c/o mid-epigastric pain, pain directly under left breast and n/v/d X 2 days. Pt reports she is unable to keep any food and fluids down. Pt reports she has been experiencing similar problems for the past month.

## 2012-04-05 NOTE — ED Notes (Signed)
Patient complains of n/v/d and abd pain for 2 days.  She states she has not felt well for 3 days.  Patient states she has been checked for gi and cardiac.  Patient states she feels swollen in her upper abd

## 2012-04-05 NOTE — ED Notes (Signed)
Pt in radiology 

## 2012-04-05 NOTE — ED Notes (Signed)
Pt sleeping in bed, asked how medications worked for her. Pt reports she is still in a lot of pain and very nauseous.

## 2012-04-05 NOTE — ED Notes (Signed)
Diet Ginger Ale given to pt

## 2012-04-06 NOTE — ED Provider Notes (Signed)
Medical screening examination/treatment/procedure(s) were performed by non-physician practitioner and as supervising physician I was immediately available for consultation/collaboration.   Shelda Jakes, MD 04/06/12 [phone]

## 2012-04-06 NOTE — ED Notes (Signed)
Pt states understanding of discharge instructions 

## 2012-05-05 ENCOUNTER — Encounter (HOSPITAL_COMMUNITY): Payer: Self-pay | Admitting: Internal Medicine

## 2012-05-05 ENCOUNTER — Inpatient Hospital Stay (HOSPITAL_COMMUNITY)
Admission: EM | Admit: 2012-05-05 | Discharge: 2012-05-10 | DRG: 074 | Disposition: A | Discharge status: Home / Self Care | Payer: Medicaid Other | Attending: Internal Medicine | Admitting: Internal Medicine

## 2012-05-05 ENCOUNTER — Observation Stay (HOSPITAL_COMMUNITY): Payer: Medicaid Other

## 2012-05-05 DIAGNOSIS — Z91199 Patient's noncompliance with other medical treatment and regimen due to unspecified reason: Secondary | ICD-10-CM

## 2012-05-05 DIAGNOSIS — Z9119 Patient's noncompliance with other medical treatment and regimen: Secondary | ICD-10-CM

## 2012-05-05 DIAGNOSIS — R1110 Vomiting, unspecified: Secondary | ICD-10-CM

## 2012-05-05 DIAGNOSIS — K3189 Other diseases of stomach and duodenum: Secondary | ICD-10-CM | POA: Diagnosis present

## 2012-05-05 DIAGNOSIS — R1010 Upper abdominal pain, unspecified: Secondary | ICD-10-CM

## 2012-05-05 DIAGNOSIS — R739 Hyperglycemia, unspecified: Secondary | ICD-10-CM

## 2012-05-05 DIAGNOSIS — E1165 Type 2 diabetes mellitus with hyperglycemia: Secondary | ICD-10-CM | POA: Diagnosis present

## 2012-05-05 DIAGNOSIS — IMO0002 Reserved for concepts with insufficient information to code with codable children: Secondary | ICD-10-CM | POA: Diagnosis present

## 2012-05-05 DIAGNOSIS — R072 Precordial pain: Secondary | ICD-10-CM

## 2012-05-05 DIAGNOSIS — E1149 Type 2 diabetes mellitus with other diabetic neurological complication: Principal | ICD-10-CM | POA: Diagnosis present

## 2012-05-05 DIAGNOSIS — E876 Hypokalemia: Secondary | ICD-10-CM | POA: Diagnosis present

## 2012-05-05 DIAGNOSIS — E785 Hyperlipidemia, unspecified: Secondary | ICD-10-CM | POA: Diagnosis present

## 2012-05-05 DIAGNOSIS — R112 Nausea with vomiting, unspecified: Secondary | ICD-10-CM | POA: Diagnosis present

## 2012-05-05 DIAGNOSIS — K3184 Gastroparesis: Secondary | ICD-10-CM | POA: Diagnosis present

## 2012-05-05 DIAGNOSIS — I10 Essential (primary) hypertension: Secondary | ICD-10-CM | POA: Diagnosis present

## 2012-05-05 DIAGNOSIS — R1115 Cyclical vomiting syndrome unrelated to migraine: Secondary | ICD-10-CM

## 2012-05-05 DIAGNOSIS — E119 Type 2 diabetes mellitus without complications: Secondary | ICD-10-CM

## 2012-05-05 DIAGNOSIS — N3010 Interstitial cystitis (chronic) without hematuria: Secondary | ICD-10-CM

## 2012-05-05 LAB — CBC WITH DIFFERENTIAL/PLATELET
Basophils Absolute: 0 10*3/uL (ref 0.0–0.1)
HCT: 45.1 % (ref 36.0–46.0)
Hemoglobin: 16.3 g/dL — ABNORMAL HIGH (ref 12.0–15.0)
Lymphocytes Relative: 28 % (ref 12–46)
Lymphs Abs: 2.1 10*3/uL (ref 0.7–4.0)
Monocytes Absolute: 0.3 10*3/uL (ref 0.1–1.0)
Neutro Abs: 5.1 10*3/uL (ref 1.7–7.7)
RBC: 5.86 MIL/uL — ABNORMAL HIGH (ref 3.87–5.11)
RDW: 12.2 % (ref 11.5–15.5)
WBC: 7.6 10*3/uL (ref 4.0–10.5)

## 2012-05-05 LAB — BASIC METABOLIC PANEL
Calcium: 8.5 mg/dL (ref 8.4–10.5)
Chloride: 105 mEq/L (ref 96–112)
Creatinine, Ser: 0.37 mg/dL — ABNORMAL LOW (ref 0.50–1.10)
GFR calc Af Amer: >90 mL/min (ref >90)
Sodium: 139 mEq/L (ref 135–145)

## 2012-05-05 LAB — URINALYSIS, ROUTINE W REFLEX MICROSCOPIC
Bilirubin Urine: NEGATIVE
Hgb urine dipstick: NEGATIVE
Ketones, ur: >80 mg/dL — AB
Nitrite: NEGATIVE
Specific Gravity, Urine: 1.015 (ref 1.005–1.030)
Urobilinogen, UA: 1 mg/dL (ref 0.0–1.0)

## 2012-05-05 LAB — COMPREHENSIVE METABOLIC PANEL
Albumin: 3.8 g/dL (ref 3.5–5.2)
BUN: 9 mg/dL (ref 6–23)
Calcium: 9.6 mg/dL (ref 8.4–10.5)
Creatinine, Ser: 0.3 mg/dL — ABNORMAL LOW (ref 0.50–1.10)
Total Bilirubin: 0.4 mg/dL (ref 0.3–1.2)
Total Protein: 8.2 g/dL (ref 6.0–8.3)

## 2012-05-05 LAB — GLUCOSE, CAPILLARY
Glucose-Capillary: 362 mg/dL — ABNORMAL HIGH (ref 70–99)
Glucose-Capillary: 260 mg/dL — ABNORMAL HIGH (ref 70–99)
Glucose-Capillary: 201 mg/dL — ABNORMAL HIGH (ref 70–99)
Glucose-Capillary: 156 mg/dL — ABNORMAL HIGH (ref 70–99)
Glucose-Capillary: 252 mg/dL — ABNORMAL HIGH (ref 70–99)

## 2012-05-05 LAB — URINE MICROSCOPIC-ADD ON

## 2012-05-05 LAB — KETONES, QUALITATIVE: Acetone, Bld: NEGATIVE

## 2012-05-05 LAB — LIPASE, BLOOD: Lipase: 39 U/L (ref 11–59)

## 2012-05-05 MED ORDER — HYDROCODONE-ACETAMINOPHEN 5-325 MG PO TABS: 1.0000 | ORAL_TABLET | Freq: Four times a day (QID) | ORAL | Status: DC | PRN

## 2012-05-05 MED ORDER — INSULIN ASPART 100 UNIT/ML ~~LOC~~ SOLN
15.0000 [IU] | Freq: Once | SUBCUTANEOUS | Status: AC
  Administered 2012-05-05: 15 [IU] via SUBCUTANEOUS
  Filled 2012-05-05: qty 1

## 2012-05-05 MED ORDER — ONDANSETRON HCL 4 MG/2ML IJ SOLN
4.0000 mg | Freq: Once | INTRAMUSCULAR | Status: AC
  Administered 2012-05-05: 4 mg via INTRAVENOUS
  Filled 2012-05-05: qty 2

## 2012-05-05 MED ORDER — ONDANSETRON HCL 4 MG PO TABS: 4.0000 mg | ORAL_TABLET | Freq: Four times a day (QID) | ORAL | Status: DC | PRN

## 2012-05-05 MED ORDER — INSULIN ASPART 100 UNIT/ML ~~LOC~~ SOLN
0.0000 [IU] | Freq: Three times a day (TID) | SUBCUTANEOUS | Status: DC
  Administered 2012-05-05: 2 [IU] via SUBCUTANEOUS
  Administered 2012-05-05: 3 [IU] via SUBCUTANEOUS

## 2012-05-05 MED ORDER — INSULIN ASPART 100 UNIT/ML ~~LOC~~ SOLN
0.0000 [IU] | SUBCUTANEOUS | Status: DC
  Administered 2012-05-05: 3 [IU] via SUBCUTANEOUS
  Administered 2012-05-06: 1 [IU] via SUBCUTANEOUS
  Administered 2012-05-06: 3 [IU] via SUBCUTANEOUS
  Administered 2012-05-06: 1 [IU] via SUBCUTANEOUS
  Administered 2012-05-06 (×2): 3 [IU] via SUBCUTANEOUS
  Administered 2012-05-06: 1 [IU] via SUBCUTANEOUS
  Administered 2012-05-07: 2 [IU] via SUBCUTANEOUS
  Administered 2012-05-07 – 2012-05-08 (×2): 1 [IU] via SUBCUTANEOUS
  Administered 2012-05-08 – 2012-05-09 (×4): 2 [IU] via SUBCUTANEOUS
  Administered 2012-05-09 (×2): 1 [IU] via SUBCUTANEOUS
  Administered 2012-05-10: 2 [IU] via SUBCUTANEOUS

## 2012-05-05 MED ORDER — SIMVASTATIN 20 MG PO TABS
20.0000 mg | ORAL_TABLET | Freq: Every evening | ORAL | Status: DC
  Administered 2012-05-05 – 2012-05-07 (×3): 20 mg via ORAL
  Filled 2012-05-05 (×3): qty 1

## 2012-05-05 MED ORDER — SODIUM CHLORIDE 0.9 % IV SOLN: INTRAVENOUS | Status: DC

## 2012-05-05 MED ORDER — HYDROMORPHONE HCL PF 1 MG/ML IJ SOLN
0.5000 mg | INTRAMUSCULAR | Status: DC | PRN
  Administered 2012-05-05 – 2012-05-06 (×3): 0.5 mg via INTRAVENOUS
  Filled 2012-05-05 (×4): qty 1

## 2012-05-05 MED ORDER — METOCLOPRAMIDE HCL 5 MG/ML IJ SOLN
10.0000 mg | Freq: Three times a day (TID) | INTRAMUSCULAR | Status: DC
  Administered 2012-05-05 – 2012-05-06 (×4): 10 mg via INTRAVENOUS
  Filled 2012-05-05 (×8): qty 2

## 2012-05-05 MED ORDER — SODIUM CHLORIDE 0.9 % IV BOLUS (SEPSIS)
1000.0000 mL | Freq: Once | INTRAVENOUS | Status: AC
  Administered 2012-05-05: 1000 mL via INTRAVENOUS

## 2012-05-05 MED ORDER — FENTANYL CITRATE 0.05 MG/ML IJ SOLN
100.0000 ug | Freq: Once | INTRAMUSCULAR | Status: AC
  Administered 2012-05-05: 100 ug via INTRAVENOUS
  Filled 2012-05-05: qty 2

## 2012-05-05 MED ORDER — ONDANSETRON HCL 4 MG/2ML IJ SOLN
4.0000 mg | Freq: Four times a day (QID) | INTRAMUSCULAR | Status: DC | PRN
  Administered 2012-05-05 – 2012-05-10 (×12): 4 mg via INTRAVENOUS
  Filled 2012-05-05 (×13): qty 2

## 2012-05-05 MED ORDER — ACETAMINOPHEN 650 MG RE SUPP: 650.0000 mg | Freq: Four times a day (QID) | RECTAL | Status: DC | PRN

## 2012-05-05 MED ORDER — SODIUM CHLORIDE 0.9 % IV SOLN
INTRAVENOUS | Status: DC
  Administered 2012-05-05 – 2012-05-06 (×3): via INTRAVENOUS

## 2012-05-05 MED ORDER — ACETAMINOPHEN 325 MG PO TABS
650.0000 mg | ORAL_TABLET | Freq: Four times a day (QID) | ORAL | Status: DC | PRN
  Administered 2012-05-10: 650 mg via ORAL
  Filled 2012-05-05: qty 2

## 2012-05-05 MED ORDER — DOCUSATE SODIUM 100 MG PO CAPS
100.0000 mg | ORAL_CAPSULE | Freq: Two times a day (BID) | ORAL | Status: DC
  Administered 2012-05-05 – 2012-05-10 (×5): 100 mg via ORAL
  Filled 2012-05-05 (×8): qty 1

## 2012-05-05 MED ORDER — FENTANYL CITRATE 0.05 MG/ML IJ SOLN
75.0000 ug | Freq: Once | INTRAMUSCULAR | Status: AC
  Administered 2012-05-05: 75 ug via INTRAVENOUS
  Filled 2012-05-05: qty 2

## 2012-05-05 MED ORDER — METOCLOPRAMIDE HCL 5 MG/ML IJ SOLN
10.0000 mg | Freq: Once | INTRAMUSCULAR | Status: AC
  Administered 2012-05-05: 10 mg via INTRAVENOUS
  Filled 2012-05-05: qty 2

## 2012-05-05 MED ORDER — LISINOPRIL 10 MG PO TABS
10.0000 mg | ORAL_TABLET | Freq: Every day | ORAL | Status: DC
  Administered 2012-05-05 – 2012-05-10 (×5): 10 mg via ORAL
  Filled 2012-05-05 (×6): qty 1

## 2012-05-05 MED ORDER — LORAZEPAM 2 MG/ML IJ SOLN
1.0000 mg | Freq: Once | INTRAMUSCULAR | Status: AC
  Administered 2012-05-05: 1 mg via INTRAVENOUS
  Filled 2012-05-05: qty 1

## 2012-05-05 MED ORDER — DIPHENHYDRAMINE HCL 50 MG/ML IJ SOLN
25.0000 mg | Freq: Once | INTRAMUSCULAR | Status: AC
  Administered 2012-05-05: 25 mg via INTRAVENOUS
  Filled 2012-05-05: qty 1

## 2012-05-05 MED ORDER — INSULIN ASPART PROT & ASPART (70-30 MIX) 100 UNIT/ML ~~LOC~~ SUSP
80.0000 [IU] | Freq: Two times a day (BID) | SUBCUTANEOUS | Status: DC
  Administered 2012-05-05 – 2012-05-06 (×2): 80 [IU] via SUBCUTANEOUS
  Filled 2012-05-05 (×2): qty 3

## 2012-05-05 MED ORDER — PROMETHAZINE HCL 25 MG/ML IJ SOLN
12.5000 mg | Freq: Three times a day (TID) | INTRAMUSCULAR | Status: DC | PRN
  Administered 2012-05-05 – 2012-05-06 (×2): 12.5 mg via INTRAVENOUS
  Filled 2012-05-05 (×3): qty 1

## 2012-05-05 MED ORDER — ALBUTEROL SULFATE HFA 108 (90 BASE) MCG/ACT IN AERS: 2.0000 | INHALATION_SPRAY | Freq: Four times a day (QID) | RESPIRATORY_TRACT | Status: DC | PRN

## 2012-05-05 NOTE — ED Notes (Signed)
Pt reports endoscopy last Monday that came back negative for H. Pylori. Today began having epigastric abdominal pain, vomiting and inability to hold anything down. Denies diarrhea pain is described as burning and radiates to throat.

## 2012-05-05 NOTE — ED Provider Notes (Addendum)
History     CSN: [id number]  Arrival date & time 05/05/12  0007   First MD Initiated Contact with Patient 05/05/12 0014      Chief Complaint  Patient presents with  . Abdominal Pain    (Consider location/radiation/quality/duration/timing/severity/associated sxs/prior treatment) HPI 36 year old female presents to emergency room complaining of upper abdominal pain and vomiting since 11 AM this morning. Patient with history of same, has had multiple ED visits in the past month for similar symptoms. She reports she had an upper endoscopy done a week ago Monday which was negative for H. Pylori. Patient has finished antibiotic treatment for same. She reports her sugars have been elevated recently. Patient was placed on Prevacid, for "redness in her stomach". Patient has been taking her insulin, lisinopril, Phenergan as prescribed. She was taken off her Reglan previously prescribed by the ED by her PCM. Patient denies any fever chills, no unusual foods, travel, no sick contacts. She's had no diarrhea or lower abdominal pain. Symptoms are similar to previous episodes.      Past Medical History  Diagnosis Date  . Interstitial cystitis   . Asthma   . Diabetes mellitus   . HLD (hyperlipidemia)     Past Surgical History  Procedure Date  . Abdominal hysterectomy     Family History  Problem Relation Age of Onset  . Heart disease      No known family history    History  Substance Use Topics  . Smoking status: Never Smoker   . Smokeless tobacco: Never Used  . Alcohol Use: No     rarely    OB History    Grav Para Term Preterm Abortions TAB SAB Ect Mult Living                  Review of Systems  Constitutional: Positive for chills and appetite change.  Gastrointestinal: Positive for nausea, vomiting, abdominal pain and abdominal distention.  All other systems reviewed and are negative.    Allergies  Kiwi extract  Home Medications   Current Outpatient Rx  Name Route  Sig Dispense Refill  . ACETAMINOPHEN 500 MG PO TABS Oral Take 1,000-1,500 mg by mouth every 6 (six) hours as needed. For pain    . ALBUTEROL SULFATE HFA 108 (90 BASE) MCG/ACT IN AERS Inhalation Inhale 2 puffs into the lungs every 6 (six) hours as needed. Wheezing or shortness of breath    . FLUCONAZOLE 150 MG PO TABS Oral Take 150 mg by mouth once a week. On Tuesdays    . HYDROCODONE-ACETAMINOPHEN 5-500 MG PO TABS Oral Take 1-2 tablets by mouth every 6 (six) hours as needed for pain. 15 tablet 0  . INSULIN ASPART PROT & ASPART (70-30) 100 UNIT/ML [place] SUSP Subcutaneous Inject 80 Units into the skin 2 (two) times daily with a meal. 40 mL 12    Four 10 ml vials.  Also supply needed syringes a ...  . LISINOPRIL 10 MG PO TABS Oral Take 1 tablet (10 mg total) by mouth daily. 30 tablet 5  . PROMETHAZINE HCL 25 MG PO TABS Oral Take 25 mg by mouth every 6 (six) hours as needed.    [name] Kitchen SIMVASTATIN 20 MG PO TABS Oral Take 20 mg by mouth every evening.      BP 166/110  Temp 98.3 F (36.8 C) (Oral)  Resp 26  SpO2 100%  LMP 02/15/2003  Physical Exam  Nursing note and vitals reviewed. Constitutional: She is oriented to person, place, and  time. She appears well-developed and well-nourished. She appears distressed (rocking on the bed, in pain, agitated, frustrated and upset).  HENT:  Head: Normocephalic and atraumatic.  Nose: Nose normal.  Mouth/Throat: Oropharynx is clear and moist.  Eyes: Conjunctivae normal and EOM are normal. Pupils are equal, round, and reactive to light.  Neck: Normal range of motion. Neck supple. No JVD present. No tracheal deviation present. No thyromegaly present.  Cardiovascular: Normal rate, regular rhythm, normal heart sounds and intact distal pulses.  Exam reveals no gallop and no friction rub.   No murmur heard. Pulmonary/Chest: Effort normal and breath sounds normal. No stridor. No respiratory distress. She has no wheezes. She has no rales. She exhibits no tenderness.    Abdominal: Soft. She exhibits no distension and no mass. There is tenderness (pain with palpation across upper abdomen). There is no rebound and no guarding.       Hypoactive bowel sounds  Musculoskeletal: Normal range of motion. She exhibits no edema and no tenderness.  Lymphadenopathy:    She has no cervical adenopathy.  Neurological: She is alert and oriented to person, place, and time. She exhibits normal muscle tone. Coordination normal.  Skin: Skin is warm and dry. No rash noted. No erythema. No pallor.  Psychiatric: Her behavior is normal. Judgment and thought content normal.       Patient upset, frustrated    ED Course  Procedures (including critical care time)  Labs Reviewed  CBC WITH DIFFERENTIAL - Abnormal; Notable for the following:    RBC 5.86 (*)     Hemoglobin 16.3 (*)     MCV 77.0 (*)     MCHC 36.1 (*)     All other components within normal limits  GLUCOSE, CAPILLARY - Abnormal; Notable for the following:    Glucose-Capillary 344 (*)     All other components within normal limits  COMPREHENSIVE METABOLIC PANEL  LIPASE, BLOOD   No results found.   1. Intractable vomiting   2. Upper abdominal pain   3. Hyperglycemia      Date: 05/05/2012  Rate: 103  Rhythm: sinus tachycardia  QRS Axis: normal  Intervals: QT prolonged  ST/T Wave abnormalities: nonspecific T wave changes  Conduction Disutrbances:none  Narrative Interpretation: rate faster than prior  Old EKG Reviewed: changes noted    MDM  36 year old female with upper abdominal pain and vomiting. Disease process has not yet been diagnosed, workup ongoing with evaluation by GI, ED visits for chest pain and abdominal pain. She has had CT scans of abdomen, ultrasounds without finding, she reports EGD recently. I have suspicion for gastroparesis, we'll try to treat with Reglan, Benadryl, Zofran. We'll check labs for possible pancreatitis given history of hyperlipidemia. Differential also includes cyclical  vomiting syndrome she denies history of use of marijuana. We'll try to manage symptoms and refer back to her gastroenterologist      4:21 AM Patient with persistent nausea and vomiting despite medications. Hyperglycemia noted, but no anion gap. Will discuss with hospitalist for admission for intractable nausea and vomiting.  Olivia Mackie, MD 05/05/12 0430  Olivia Mackie, MD 05/05/12 [phone]

## 2012-05-05 NOTE — ED Notes (Addendum)
PT. MEDICATED WITH ZOFRAN/FENTANYL AND ATIVAN FOR RECURRING GENERALIZED ABDOMINAL PAIN AND VOMITTING .

## 2012-05-05 NOTE — Progress Notes (Signed)
Pt refused to take 70/30 and novolog insulin this evening at 5. She said that when she gets sick she typically doesn't take her insulin. Rn tried to educate her on the importance of insulin for her and she was not willing to take her meds.

## 2012-05-05 NOTE — ED Notes (Signed)
REPORT GIVEN TO 6000 UNIT FLOOR NURSE , TRANSPORTED PT. IN STABLE CONDITION WITH NO PAIN OR NAUSEA , IV SITE INTACT.

## 2012-05-05 NOTE — Progress Notes (Signed)
Admitted with nausea and vomiting.  Has significant history of diabetes.  Spoke with patient about her home diabetes regimen.  Patient stated she was diagnosed in 2006.  Took oral diabetes medications for several years and started on insulin about 3 years ago.  Verified home insulin regimen: Novolog 70/30 Mix 80 units bid with breakfast and supper.  Not well controlled at home as evidenced by A1c of 12.3% (05/05/12).  Recently lost her job and her insurance.  Patient stated she applied for Medicaid back in mid September.  Won't know if she qualifies for Medicaid for another few weeks.  Patient stated her insulin out of pocket is expensive.  Patient could be switched to the Novolin 70/30 insulin in the interim (Novolin 70/30 insulin can be purchased at [facility] for $24.88 per vial with a physician's Rx).    Patient stated she just established care with a PCP in [place] (Dr. Karen Kays).    Noted patient not eating well at present.  Starting CL diet slowly.  70/30 insulin not a great option for patients who are not eating well.  Noted patient was able to keep an applesauce down at lunch time.  If PO improves, can continue 70/30 insulin, however, may want to back down on her dose to 40 units bid with meals.  If PO does not improve, recommend stopping 70/30 insulin and starting Lantus insulin until her PO improves (Lantus 45 units QHS).  Will follow. Ambrose Finland RN, MSN, CDE Diabetes Coordinator Inpatient [facility] [phone]

## 2012-05-05 NOTE — H&P (Signed)
Marissa Barrett is an 36 y.o. female.   Patient was seen and examined on May 05, 2012. PCP - patient's PCPs in [place]. Chief Complaint: Nausea vomiting. HPI: 36 year-old female with history of diabetes mellitus2 has been experiencing nausea vomiting since yesterday afternoon. She has been having persistent recurrent nausea vomiting with abdominal cramps. Denies any fever chills or any diarrhea. Since the symptoms have been persistent patient presented the ER. Despite giving antiemetics and pain relief medications patient still has some pain and nausea. Patient has been having these symptoms for last 3 months. Last week patient has been to an outside facility and as per patient has had an EGD which showed just mild erythema in the stomach. At this time patient has been admitted for further management. Patient's blood sugar was found to be initially high which improved with IV fluids and subcutaneous insulin. Patient states she didn't miss one dose of her insulin yesterday. Otherwise denies any chest pain shortness of breath.  Past Medical History  Diagnosis Date  . Interstitial cystitis   . Asthma   . Diabetes mellitus   . HLD (hyperlipidemia)     Past Surgical History  Procedure Date  . Abdominal hysterectomy     Family History  Problem Relation Age of Onset  . Heart disease      No known family history   Social History:  reports that she has never smoked. She has never used smokeless tobacco. She reports that she does not drink alcohol or use illicit drugs.  Allergies:  Allergies  Allergen Reactions  . Kiwi Extract Itching and Swelling    And tongue swelling     (Not in a hospital admission)  Results for orders placed during the hospital encounter of 05/05/12 (from the past 48 hour(s))  COMPREHENSIVE METABOLIC PANEL     Status: Abnormal   Collection Time   05/05/12 12:24 AM      Component Value Range Comment   Sodium 134 (*) 135 - 145 mEq/L    Potassium 3.7  3.5 - 5.1  mEq/L    Chloride 98  96 - 112 mEq/L    CO2 16 (*) 19 - 32 mEq/L    Glucose, Bld 401 (*) 70 - 99 mg/dL    BUN 9  6 - 23 mg/dL    Creatinine, Ser 7.82 (*) 0.50 - 1.10 mg/dL    Calcium 9.6  8.4 - 95.6 mg/dL    Total Protein 8.2  6.0 - 8.3 g/dL    Albumin 3.8  3.5 - 5.2 g/dL    AST 19  0 - 37 U/L    ALT 20  0 - 35 U/L    Alkaline Phosphatase 93  39 - 117 U/L    Total Bilirubin 0.4  0.3 - 1.2 mg/dL    GFR calc non Af Amer >90  >90 mL/min    GFR calc Af Amer >90  >90 mL/min   LIPASE, BLOOD     Status: Normal   Collection Time   05/05/12 12:24 AM      Component Value Range Comment   Lipase 39  11 - 59 U/L   CBC WITH DIFFERENTIAL     Status: Abnormal   Collection Time   05/05/12 12:24 AM      Component Value Range Comment   WBC 7.6  4.0 - 10.5 K/uL    RBC 5.86 (*) 3.87 - 5.11 MIL/uL    Hemoglobin 16.3 (*) 12.0 - 15.0 g/dL  HCT 45.1  36.0 - 46.0 %    MCV 77.0 (*) 78.0 - 100.0 fL    MCH 27.8  26.0 - 34.0 pg    MCHC 36.1 (*) 30.0 - 36.0 g/dL    RDW 16.1  09.6 - 04.5 %    Platelets 292  150 - 400 K/uL    Neutrophils Relative 67  43 - 77 %    Neutro Abs 5.1  1.7 - 7.7 K/uL    Lymphocytes Relative 28  12 - 46 %    Lymphs Abs 2.1  0.7 - 4.0 K/uL    Monocytes Relative 4  3 - 12 %    Monocytes Absolute 0.3  0.1 - 1.0 K/uL    Eosinophils Relative 0  0 - 5 %    Eosinophils Absolute 0.0  0.0 - 0.7 K/uL    Basophils Relative 0  0 - 1 %    Basophils Absolute 0.0  0.0 - 0.1 K/uL   GLUCOSE, CAPILLARY     Status: Abnormal   Collection Time   05/05/12 12:31 AM      Component Value Range Comment   Glucose-Capillary 344 (*) 70 - 99 mg/dL    Comment 1 Notify RN      Comment 2 Documented in Chart     URINALYSIS, ROUTINE W REFLEX MICROSCOPIC     Status: Abnormal   Collection Time   05/05/12  1:54 AM      Component Value Range Comment   Color, Urine YELLOW  YELLOW    APPearance CLEAR  CLEAR    Specific Gravity, Urine 1.015  1.005 - 1.030    pH 6.5  5.0 - 8.0    Glucose, UA >1000 (*)  NEGATIVE mg/dL    Hgb urine dipstick NEGATIVE  NEGATIVE    Bilirubin Urine NEGATIVE  NEGATIVE    Ketones, ur >80 (*) NEGATIVE mg/dL    Protein, ur NEGATIVE  NEGATIVE mg/dL    Urobilinogen, UA 1.0  0.0 - 1.0 mg/dL    Nitrite NEGATIVE  NEGATIVE    Leukocytes, UA NEGATIVE  NEGATIVE   URINE MICROSCOPIC-ADD ON     Status: Abnormal   Collection Time   05/05/12  1:54 AM      Component Value Range Comment   Squamous Epithelial / LPF FEW (*) RARE    WBC, UA 0-2  <3 WBC/hpf    RBC / HPF 0-2  <3 RBC/hpf    Bacteria, UA RARE  RARE    Urine-Other FEW YEAST     GLUCOSE, CAPILLARY     Status: Abnormal   Collection Time   05/05/12  3:42 AM      Component Value Range Comment   Glucose-Capillary 362 (*) 70 - 99 mg/dL   BASIC METABOLIC PANEL     Status: Abnormal   Collection Time   05/05/12  4:28 AM      Component Value Range Comment   Sodium 139  135 - 145 mEq/L    Potassium 3.6  3.5 - 5.1 mEq/L    Chloride 105  96 - 112 mEq/L    CO2 21  19 - 32 mEq/L    Glucose, Bld 273 (*) 70 - 99 mg/dL    BUN 8  6 - 23 mg/dL    Creatinine, Ser 4.09 (*) 0.50 - 1.10 mg/dL    Calcium 8.5  8.4 - 81.1 mg/dL    GFR calc non Af Amer >90  >90 mL/min    GFR calc  Af Amer >90  >90 mL/min   KETONES, QUALITATIVE     Status: Normal   Collection Time   05/05/12  4:28 AM      Component Value Range Comment   Acetone, Bld NEGATIVE  NEGATIVE   GLUCOSE, CAPILLARY     Status: Abnormal   Collection Time   05/05/12  5:08 AM      Component Value Range Comment   Glucose-Capillary 260 (*) 70 - 99 mg/dL    Comment 1 Documented in Chart      Comment 2 Notify RN      No results found.  Review of Systems  Constitutional: Negative.   HENT: Negative.   Eyes: Negative.   Respiratory: Negative.   Cardiovascular: Negative.   Gastrointestinal: Positive for nausea, vomiting and abdominal pain.  Genitourinary: Negative.   Musculoskeletal: Negative.   Skin: Negative.   Neurological: Negative.   Endo/Heme/Allergies: Negative.    Psychiatric/Behavioral: Negative.     Blood pressure 128/73, pulse 75, temperature 98.3 F (36.8 C), temperature source Oral, resp. rate 15, last menstrual period 02/15/2003, SpO2 98.00%. Physical Exam  Constitutional: She is oriented to person, place, and time. She appears well-developed and well-nourished. No distress.  HENT:  Head: Normocephalic and atraumatic.  Right Ear: External ear normal.  Left Ear: External ear normal.  Nose: Nose normal.  Mouth/Throat: Oropharynx is clear and moist. No oropharyngeal exudate.  Eyes: Conjunctivae normal are normal. Pupils are equal, round, and reactive to light. Right eye exhibits no discharge. Left eye exhibits no discharge. No scleral icterus.  Neck: Normal range of motion. Neck supple.  Cardiovascular: Normal rate and regular rhythm.   Respiratory: Effort normal and breath sounds normal. No respiratory distress. She has no wheezes. She has no rales.  GI: Soft. Bowel sounds are normal. She exhibits no distension. There is no tenderness. There is no rebound and no guarding.  Musculoskeletal: She exhibits no edema and no tenderness.  Neurological: She is alert and oriented to person, place, and time.       Moves all extremities.  Skin: Skin is warm and dry. She is not diaphoretic.     Assessment/Plan #1. 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 I have placed patient on clear liquid diet. Slowly advance as tolerated. #2. Uncontrolled diabetes mellitus type 2 - continue home medications. Patient's repeat metabolic panel shows improvement in her bicarbonate levels. Patient acetone levels have been negative. Continue to closely follow CBGs. #3. Hyperlipidemia - continue statins and patient can take orally. #4. Continue lisinopril and patient can take orally. #5. History of interstitial cystitis - continue pain medications.  CODE STATUS - full code.  Eduard Clos. 05/05/2012, 5:28 AM

## 2012-05-06 DIAGNOSIS — E785 Hyperlipidemia, unspecified: Secondary | ICD-10-CM

## 2012-05-06 DIAGNOSIS — N3010 Interstitial cystitis (chronic) without hematuria: Secondary | ICD-10-CM

## 2012-05-06 DIAGNOSIS — R7309 Other abnormal glucose: Secondary | ICD-10-CM

## 2012-05-06 LAB — GLUCOSE, CAPILLARY
Glucose-Capillary: 245 mg/dL — ABNORMAL HIGH (ref 70–99)
Glucose-Capillary: 204 mg/dL — ABNORMAL HIGH (ref 70–99)

## 2012-05-06 LAB — COMPREHENSIVE METABOLIC PANEL
ALT: 18 U/L (ref 0–35)
BUN: 5 mg/dL — ABNORMAL LOW (ref 6–23)
CO2: 22 mEq/L (ref 19–32)
Calcium: 8.8 mg/dL (ref 8.4–10.5)
Creatinine, Ser: 0.39 mg/dL — ABNORMAL LOW (ref 0.50–1.10)
GFR calc Af Amer: >90 mL/min (ref >90)
GFR calc non Af Amer: >90 mL/min (ref >90)
Glucose, Bld: 220 mg/dL — ABNORMAL HIGH (ref 70–99)
Sodium: 139 mEq/L (ref 135–145)
Total Protein: 6.9 g/dL (ref 6.0–8.3)

## 2012-05-06 LAB — CBC
HCT: 39.3 % (ref 36.0–46.0)
Hemoglobin: 14.1 g/dL (ref 12.0–15.0)
MCH: 28.3 pg (ref 26.0–34.0)
MCHC: 35.9 g/dL (ref 30.0–36.0)
MCV: 78.9 fL (ref 78.0–100.0)
RBC: 4.98 MIL/uL (ref 3.87–5.11)

## 2012-05-06 MED ORDER — SODIUM CHLORIDE 0.9 % IV SOLN: 8.0000 mg/h | INTRAVENOUS | Status: DC

## 2012-05-06 MED ORDER — POTASSIUM CHLORIDE IN NACL 20-0.9 MEQ/L-% IV SOLN
INTRAVENOUS | Status: DC
  Administered 2012-05-06 – 2012-05-07 (×2): via INTRAVENOUS
  Filled 2012-05-06 (×2): qty 1000

## 2012-05-06 MED ORDER — SODIUM CHLORIDE 0.9 % IV SOLN
INTRAVENOUS | Status: AC
  Administered 2012-05-06: 13:00:00 via INTRAVENOUS

## 2012-05-06 MED ORDER — PANTOPRAZOLE SODIUM 40 MG IV SOLR
40.0000 mg | Freq: Two times a day (BID) | INTRAVENOUS | Status: DC
  Administered 2012-05-06 – 2012-05-10 (×8): 40 mg via INTRAVENOUS
  Filled 2012-05-06 (×9): qty 40

## 2012-05-06 MED ORDER — SODIUM CHLORIDE 0.9 % IV SOLN
80.0000 mg | Freq: Once | INTRAVENOUS | Status: AC
  Administered 2012-05-06: 80 mg via INTRAVENOUS
  Filled 2012-05-06: qty 80

## 2012-05-06 MED ORDER — POLYETHYLENE GLYCOL 3350 17 G PO PACK
17.0000 g | PACK | Freq: Every day | ORAL | Status: DC
  Administered 2012-05-10: 17 g via ORAL
  Filled 2012-05-06 (×5): qty 1

## 2012-05-06 MED ORDER — INSULIN ASPART PROT & ASPART (70-30 MIX) 100 UNIT/ML ~~LOC~~ SUSP
60.0000 [IU] | Freq: Two times a day (BID) | SUBCUTANEOUS | Status: DC
  Administered 2012-05-06 – 2012-05-07 (×3): 60 [IU] via SUBCUTANEOUS
  Filled 2012-05-06: qty 3

## 2012-05-06 MED ORDER — POTASSIUM CHLORIDE 10 MEQ/100ML IV SOLN
10.0000 meq | INTRAVENOUS | Status: AC
Start: 2012-05-06 — End: 2012-05-06
  Administered 2012-05-06 (×4): 10 meq via INTRAVENOUS
  Filled 2012-05-06: qty 200
  Filled 2012-05-06 (×2): qty 100

## 2012-05-06 MED ORDER — HYDRALAZINE HCL 20 MG/ML IJ SOLN
5.0000 mg | Freq: Four times a day (QID) | INTRAMUSCULAR | Status: DC | PRN
  Filled 2012-05-06: qty 0.25

## 2012-05-06 MED ORDER — PROMETHAZINE HCL 25 MG/ML IJ SOLN
25.0000 mg | Freq: Three times a day (TID) | INTRAMUSCULAR | Status: DC | PRN
Start: 2012-05-06 — End: 2012-05-10
  Administered 2012-05-06 – 2012-05-09 (×11): 25 mg via INTRAVENOUS
  Filled 2012-05-06 (×11): qty 1

## 2012-05-06 MED ORDER — FENTANYL CITRATE 0.05 MG/ML IJ SOLN
25.0000 ug | INTRAMUSCULAR | Status: DC | PRN
  Administered 2012-05-06 – 2012-05-10 (×16): 25 ug via INTRAVENOUS
  Filled 2012-05-06 (×16): qty 2

## 2012-05-06 NOTE — Progress Notes (Signed)
[facility]                                                                                Patient Demographics  Marissa Barrett, is a 36 y.o. female  VHQ:[id number]  [id number]  DOB - 1976/03/14  Admit date - 05/05/2012  Admitting Physician Eduard Clos, MD  Outpatient Primary MD for the patient is DEFAULT,PROVIDER, MD  LOS - 1   Chief Complaint  Patient presents with  . Abdominal Pain        Assessment & Plan    1. Persistent Nausea and vomiting for the last 2-3 months, patient reportedly has history of H. pylori diagnosed at [facility], no relief with supportive care, KUB revealed large stool, enema resulted in large amount of stool removal yesterday however patient continues to be nauseated, does have history of diabetes mellitus, will obtain CT abdomen pelvis, initiate IV PPI, continue scheduled Reglan and when necessary Zofran, liver enzymes and lipase are stable, will request GI to evaluate her one time. If everything negative will question diabetic gastroparesis.   2. History of diabetes mellitus type 2 and dyslipidemia - home medications to be continued as tolerated, CBGs and sliding scale insulin every 4 as by mouth intake is minimal. Diabetes has been in very poor control patient has been counseled, she is refusing sliding scale insulin here. Have reduced her 7030 dose with holding parameters.  Lab Results  Component Value Date   HGBA1C 12.3* 05/05/2012    CBG (last 3)   Basename 05/06/12 0758 05/06/12 0403 05/06/12 0018  GLUCAP 204* 245* 208*     3. Low potassium replaced will be rechecked again in the morning.    4. Hypertension will adjust medications and write for when necessary IV hydralazine also.    Code Status: Full  Family Communication: Discussed with the patient  Disposition Plan: Home    Procedures CT   Consults  GI- Mann   Time Spent in minutes  35   Antibiotics     Anti-infectives    None       Scheduled Meds:    . docusate sodium  100 mg Oral BID  . insulin aspart  0-9 Units Subcutaneous Q4H  . insulin aspart protamine-insulin aspart  60 Units Subcutaneous BID WC  . lisinopril  10 mg Oral Daily  . metoCLOPramide (REGLAN) injection  10 mg Intravenous Q8H  . pantoprazole (PROTONIX) IV  80 mg Intravenous Once  . pantoprazole (PROTONIX) IV  40 mg Intravenous Q12H  . polyethylene glycol  17 g Oral Daily  . potassium chloride  10 mEq Intravenous Q1 Hr x 4  . simvastatin  20 mg Oral QPM  . DISCONTD: insulin aspart  0-9 Units Subcutaneous TID WC  . DISCONTD: insulin aspart protamine-insulin aspart  80 Units Subcutaneous BID WC   Continuous Infusions:    . sodium chloride    . 0.9 % NaCl with KCl 20 mEq / L    . DISCONTD: sodium chloride 100 mL/hr at 05/06/12 0234  . DISCONTD: pantoprozole (PROTONIX) infusion     PRN Meds:.acetaminophen, acetaminophen, albuterol, HYDROcodone-acetaminophen, HYDROmorphone (DILAUDID) injection, ondansetron (ZOFRAN) IV, ondansetron, promethazine, DISCONTD: promethazine   DVT  Prophylaxis    SCDs    Lab Results  Component Value Date   PLT 273 05/06/2012      Susa Raring K M.D on 05/06/2012 at 9:36 AM  Between 7am to 7pm - Pager - [phone]  After 7pm go to [email or web address] - password [email or web address]  And look for the night coverage person covering for me after hours  [facility] Office  [phone]    Subjective:   Marissa Barrett today has, No headache, No chest pain, No abdominal pain - + Nausea, No new weakness tingling or numbness, No Cough - SOB.    Objective:   Filed Vitals:   05/05/12 1721 05/05/12 2139 05/06/12 0244 05/06/12 0511  BP: 147/92 155/100 168/102 118/71  Pulse: 95 95 101 92  Temp: 98.5 F (36.9 C) 98.5 F (36.9 C) 99.1 F (37.3 C) 98.3 F (36.8 C)  TempSrc: Oral Oral    Resp: 19 18 18 18   Height:      Weight:      SpO2: 100% 96% 100% 97%    Wt Readings from Last 3 Encounters:  05/05/12  104.327 kg (230 lb)  04/05/12 104.327 kg (230 lb)  03/21/12 104.327 kg (230 lb)     Intake/Output Summary (Last 24 hours) at 05/06/12 0936 Last data filed at 05/06/12 0600  Gross per 24 hour  Intake   2260 ml  Output   2375 ml  Net   -115 ml    Exam Awake Alert, Oriented X 3, No new F.N deficits, Normal affect [place].AT,PERRAL Supple Neck,No JVD, No cervical lymphadenopathy appriciated.  Symmetrical Chest wall movement, Good air movement bilaterally, CTAB RRR,No Gallops,Rubs or new Murmurs, No Parasternal Heave +ve B.Sounds, Abd Soft, Non tender, No organomegaly appriciated, No rebound - guarding or rigidity. No Cyanosis, Clubbing or edema, No new Rash or bruise      Data Review   Micro Results No results found for this or any previous visit (from the past 240 hour(s)).  Radiology Reports Dg Abd Acute W/chest  05/05/2012  *RADIOLOGY REPORT*  Clinical Data: Nausea, abdominal pain, vomit  ACUTE ABDOMEN SERIES (ABDOMEN 2 VIEW & CHEST 1 VIEW)  Comparison: Pain 04/05/2012  Findings: Cardiomediastinal silhouette is stable.  Central mild bronchitic changes.  No focal infiltrate.  There is nonspecific nonobstructive bowel gas pattern.  Moderate colonic stool.  Again noted stimulation device in right sacrum.  No free abdominal air.  IMPRESSION: No focal infiltrate.  Central mild bronchitic changes.  Nonspecific nonobstructive bowel gas pattern.  Moderate colonic stool.  No free abdominal air.   Original Report Authenticated By: Natasha Mead, M.D.     CBC  Lab 05/06/12 0535 05/05/12 0024  WBC 8.0 7.6  HGB 14.1 16.3*  HCT 39.3 45.1  PLT 273 292  MCV 78.9 77.0*  MCH 28.3 27.8  MCHC 35.9 36.1*  RDW 12.4 12.2  LYMPHSABS -- 2.1  MONOABS -- 0.3  EOSABS -- 0.0  BASOSABS -- 0.0  BANDABS -- --    Chemistries   Lab 05/06/12 0535 05/05/12 0428 05/05/12 0024  NA 139 139 134*  K 3.2* 3.6 3.7  CL 105 105 98  CO2 22 21 16*  GLUCOSE 220* 273* 401*  BUN 5* 8 9  CREATININE 0.39* 0.37*  0.30*  CALCIUM 8.8 8.5 9.6  MG -- -- --  AST 17 -- 19  ALT 18 -- 20  ALKPHOS 75 -- 93  BILITOT 0.3 -- 0.4   ------------------------------------------------------------------------------------------------------------------ estimated creatinine clearance is 117.5 ml/min (by C-G  formula based on Cr of 0.39). ------------------------------------------------------------------------------------------------------------------  [facility] 05/05/12 0024  HGBA1C 12.3*   ------------------------------------------------------------------------------------------------------------------ No results found for this basename: CHOL:2,HDL:2,LDLCALC:2,TRIG:2,CHOLHDL:2,LDLDIRECT:2 in the last 72 hours ------------------------------------------------------------------------------------------------------------------ No results found for this basename: TSH,T4TOTAL,FREET3,T3FREE,THYROIDAB in the last 72 hours ------------------------------------------------------------------------------------------------------------------ No results found for this basename: VITAMINB12:2,FOLATE:2,FERRITIN:2,TIBC:2,IRON:2,RETICCTPCT:2 in the last 72 hours  Coagulation profile No results found for this basename: INR:5,PROTIME:5 in the last 168 hours  No results found for this basename: DDIMER:2 in the last 72 hours  Cardiac Enzymes No results found for this basename: CK:3,CKMB:3,TROPONINI:3,MYOGLOBIN:3 in the last 168 hours ------------------------------------------------------------------------------------------------------------------ No components found with this basename: POCBNP:3

## 2012-05-06 NOTE — Progress Notes (Signed)
0215 Patient vomiting light brown liquid. Phenergan 12.mg given at 0031. Dr. Charline Bills. Magick-Myers notified and ordered to increase the dose of phenergan 25mg . Patient was given 0226 phenergan 25mg  IV. Will continue to monitor.

## 2012-05-06 NOTE — Consult Note (Signed)
Reason for Consult: Severe nausea and vomiting.  Referring Physician: [facility]-Dr. Susa Raring  Marissa Barrett is an 36 y.o. female.  HPI: 36 year old black female with unrelenting nausea since this past weekend. Symptoms initially started on 02/28/12. Denies having any melena or hematochezia. Complains of severe diffuse abdominal pain. Has occasional constipation. Has been very non-compliant with medical care.    Past Medical History  Diagnosis Date  . Interstitial cystitis   . Asthma   . Diabetes mellitus   . HLD (hyperlipidemia)    Past Surgical History  Procedure Date  . Abdominal hysterectomy     Family History  Problem Relation Age of Onset  . Heart disease      No known family history   Social History:  reports that she has never smoked. She has never used smokeless tobacco. She reports that she does not drink alcohol or use illicit drugs.  Allergies:  Allergies  Allergen Reactions  . Kiwi Extract Itching and Swelling    And tongue swelling   Medications: I have reviewed the patient's current medications.  Results for orders placed during the hospital encounter of 05/05/12 (from the past 48 hour(s))  COMPREHENSIVE METABOLIC PANEL     Status: Abnormal   Collection Time   05/05/12 12:24 AM      Component Value Range Comment   Sodium 134 (*) 135 - 145 mEq/L    Potassium 3.7  3.5 - 5.1 mEq/L    Chloride 98  96 - 112 mEq/L    CO2 16 (*) 19 - 32 mEq/L    Glucose, Bld 401 (*) 70 - 99 mg/dL    BUN 9  6 - 23 mg/dL    Creatinine, Ser 1.61 (*) 0.50 - 1.10 mg/dL    Calcium 9.6  8.4 - 09.6 mg/dL    Total Protein 8.2  6.0 - 8.3 g/dL    Albumin 3.8  3.5 - 5.2 g/dL    AST 19  0 - 37 U/L    ALT 20  0 - 35 U/L    Alkaline Phosphatase 93  39 - 117 U/L    Total Bilirubin 0.4  0.3 - 1.2 mg/dL    GFR calc non Af Amer >90  >90 mL/min    GFR calc Af Amer >90  >90 mL/min   LIPASE, BLOOD     Status: Normal   Collection Time   05/05/12 12:24 AM      Component Value Range Comment   Lipase 39  11 - 59 U/L   CBC WITH DIFFERENTIAL     Status: Abnormal   Collection Time   05/05/12 12:24 AM      Component Value Range Comment   WBC 7.6  4.0 - 10.5 K/uL    RBC 5.86 (*) 3.87 - 5.11 MIL/uL    Hemoglobin 16.3 (*) 12.0 - 15.0 g/dL    HCT 04.5  40.9 - 81.1 %    MCV 77.0 (*) 78.0 - 100.0 fL    MCH 27.8  26.0 - 34.0 pg    MCHC 36.1 (*) 30.0 - 36.0 g/dL    RDW 91.4  78.2 - 95.6 %    Platelets 292  150 - 400 K/uL    Neutrophils Relative 67  43 - 77 %    Neutro Abs 5.1  1.7 - 7.7 K/uL    Lymphocytes Relative 28  12 - 46 %    Lymphs Abs 2.1  0.7 - 4.0 K/uL    Monocytes  Relative 4  3 - 12 %    Monocytes Absolute 0.3  0.1 - 1.0 K/uL    Eosinophils Relative 0  0 - 5 %    Eosinophils Absolute 0.0  0.0 - 0.7 K/uL    Basophils Relative 0  0 - 1 %    Basophils Absolute 0.0  0.0 - 0.1 K/uL   HEMOGLOBIN A1C     Status: Abnormal   Collection Time   05/05/12 12:24 AM      Component Value Range Comment   Hemoglobin A1C 12.3 (*) <5.7 %    Mean Plasma Glucose 306 (*) <117 mg/dL   GLUCOSE, CAPILLARY     Status: Abnormal   Collection Time   05/05/12 12:31 AM      Component Value Range Comment   Glucose-Capillary 344 (*) 70 - 99 mg/dL    Comment 1 Notify RN      Comment 2 Documented in Chart     URINALYSIS, ROUTINE W REFLEX MICROSCOPIC     Status: Abnormal   Collection Time   05/05/12  1:54 AM      Component Value Range Comment   Color, Urine YELLOW  YELLOW    APPearance CLEAR  CLEAR    Specific Gravity, Urine 1.015  1.005 - 1.030    pH 6.5  5.0 - 8.0    Glucose, UA >1000 (*) NEGATIVE mg/dL    Hgb urine dipstick NEGATIVE  NEGATIVE    Bilirubin Urine NEGATIVE  NEGATIVE    Ketones, ur >80 (*) NEGATIVE mg/dL    Protein, ur NEGATIVE  NEGATIVE mg/dL    Urobilinogen, UA 1.0  0.0 - 1.0 mg/dL    Nitrite NEGATIVE  NEGATIVE    Leukocytes, UA NEGATIVE  NEGATIVE   URINE MICROSCOPIC-ADD ON     Status: Abnormal   Collection Time   05/05/12  1:54 AM      Component Value Range Comment    Squamous Epithelial / LPF FEW (*) RARE    WBC, UA 0-2  <3 WBC/hpf    RBC / HPF 0-2  <3 RBC/hpf    Bacteria, UA RARE  RARE    Urine-Other FEW YEAST     GLUCOSE, CAPILLARY     Status: Abnormal   Collection Time   05/05/12  3:42 AM      Component Value Range Comment   Glucose-Capillary 362 (*) 70 - 99 mg/dL   BASIC METABOLIC PANEL     Status: Abnormal   Collection Time   05/05/12  4:28 AM      Component Value Range Comment   Sodium 139  135 - 145 mEq/L    Potassium 3.6  3.5 - 5.1 mEq/L    Chloride 105  96 - 112 mEq/L    CO2 21  19 - 32 mEq/L    Glucose, Bld 273 (*) 70 - 99 mg/dL    BUN 8  6 - 23 mg/dL    Creatinine, Ser 1.61 (*) 0.50 - 1.10 mg/dL    Calcium 8.5  8.4 - 09.6 mg/dL    GFR calc non Af Amer >90  >90 mL/min    GFR calc Af Amer >90  >90 mL/min   KETONES, QUALITATIVE     Status: Normal   Collection Time   05/05/12  4:28 AM      Component Value Range Comment   Acetone, Bld NEGATIVE  NEGATIVE   GLUCOSE, CAPILLARY     Status: Abnormal   Collection Time   05/05/12  5:08 AM      Component Value Range Comment   Glucose-Capillary 260 (*) 70 - 99 mg/dL    Comment 1 Documented in Chart      Comment 2 Notify RN     GLUCOSE, CAPILLARY     Status: Abnormal   Collection Time   05/05/12  7:50 AM      Component Value Range Comment   Glucose-Capillary 201 (*) 70 - 99 mg/dL    Comment 1 Notify RN     GLUCOSE, CAPILLARY     Status: Abnormal   Collection Time   05/05/12 12:37 PM      Component Value Range Comment   Glucose-Capillary 184 (*) 70 - 99 mg/dL    Comment 1 Notify RN     GLUCOSE, CAPILLARY     Status: Abnormal   Collection Time   05/05/12  5:22 PM      Component Value Range Comment   Glucose-Capillary 156 (*) 70 - 99 mg/dL    Comment 1 Notify RN      Comment 2 Documented in Chart     GLUCOSE, CAPILLARY     Status: Abnormal   Collection Time   05/05/12  8:03 PM      Component Value Range Comment   Glucose-Capillary 252 (*) 70 - 99 mg/dL    Comment 1 Notify RN       Comment 2 Documented in Chart     GLUCOSE, CAPILLARY     Status: Abnormal   Collection Time   05/06/12 12:18 AM      Component Value Range Comment   Glucose-Capillary 208 (*) 70 - 99 mg/dL    Comment 1 Documented in Chart      Comment 2 Notify RN     GLUCOSE, CAPILLARY     Status: Abnormal   Collection Time   05/06/12  4:03 AM      Component Value Range Comment   Glucose-Capillary 245 (*) 70 - 99 mg/dL    Comment 1 Notify RN      Comment 2 Documented in Chart     CBC     Status: Normal   Collection Time   05/06/12  5:35 AM      Component Value Range Comment   WBC 8.0  4.0 - 10.5 K/uL    RBC 4.98  3.87 - 5.11 MIL/uL    Hemoglobin 14.1  12.0 - 15.0 g/dL    HCT 16.1  09.6 - 04.5 %    MCV 78.9  78.0 - 100.0 fL    MCH 28.3  26.0 - 34.0 pg    MCHC 35.9  30.0 - 36.0 g/dL    RDW 40.9  81.1 - 91.4 %    Platelets 273  150 - 400 K/uL   COMPREHENSIVE METABOLIC PANEL     Status: Abnormal   Collection Time   05/06/12  5:35 AM      Component Value Range Comment   Sodium 139  135 - 145 mEq/L    Potassium 3.2 (*) 3.5 - 5.1 mEq/L    Chloride 105  96 - 112 mEq/L    CO2 22  19 - 32 mEq/L    Glucose, Bld 220 (*) 70 - 99 mg/dL    BUN 5 (*) 6 - 23 mg/dL    Creatinine, Ser 7.82 (*) 0.50 - 1.10 mg/dL    Calcium 8.8  8.4 - 95.6 mg/dL    Total Protein 6.9  6.0 - 8.3 g/dL  Albumin 3.1 (*) 3.5 - 5.2 g/dL    AST 17  0 - 37 U/L    ALT 18  0 - 35 U/L    Alkaline Phosphatase 75  39 - 117 U/L    Total Bilirubin 0.3  0.3 - 1.2 mg/dL    GFR calc non Af Amer >90  >90 mL/min    GFR calc Af Amer >90  >90 mL/min   GLUCOSE, CAPILLARY     Status: Abnormal   Collection Time   05/06/12  7:58 AM      Component Value Range Comment   Glucose-Capillary 204 (*) 70 - 99 mg/dL   GLUCOSE, CAPILLARY     Status: Abnormal   Collection Time   05/06/12 11:43 AM      Component Value Range Comment   Glucose-Capillary 143 (*) 70 - 99 mg/dL   GLUCOSE, CAPILLARY     Status: Abnormal   Collection Time   05/06/12   3:54 PM      Component Value Range Comment   Glucose-Capillary 139 (*) 70 - 99 mg/dL     Dg Abd Acute W/chest  05/05/2012  *RADIOLOGY REPORT*  Clinical Data: Nausea, abdominal pain, vomit  ACUTE ABDOMEN SERIES (ABDOMEN 2 VIEW & CHEST 1 VIEW)  Comparison: Pain 04/05/2012  Findings: Cardiomediastinal silhouette is stable.  Central mild bronchitic changes.  No focal infiltrate.  There is nonspecific nonobstructive bowel gas pattern.  Moderate colonic stool.  Again noted stimulation device in right sacrum.  No free abdominal air.  IMPRESSION: No focal infiltrate.  Central mild bronchitic changes.  Nonspecific nonobstructive bowel gas pattern.  Moderate colonic stool.  No free abdominal air.   Original Report Authenticated By: Natasha Mead, M.D.    Review of Systems  Constitutional: Positive for malaise/fatigue. Negative for fever, chills, weight loss and diaphoresis.  HENT: Negative.   Eyes: Negative.   Respiratory: Negative.   Gastrointestinal: Positive for nausea, vomiting, abdominal pain and constipation. Negative for diarrhea, blood in stool and melena.  Genitourinary: Negative.   Musculoskeletal: Positive for back pain.  Skin: Negative.   Neurological: Positive for dizziness, tingling and weakness. Negative for tremors and sensory change.  Psychiatric/Behavioral: The patient is nervous/anxious.    Blood pressure 158/96, pulse 91, temperature 98.4 F (36.9 C), temperature source Oral, resp. rate 18, height 5\' 6"  (1.676 m), weight 104.327 kg (230 lb), last menstrual period 02/15/2003, SpO2 99.00%. Physical Exam  Constitutional: She is oriented to person, place, and time. She appears well-developed and well-nourished.  HENT:  Head: Normocephalic and atraumatic.  Eyes: Conjunctivae normal and EOM are normal. Pupils are equal, round, and reactive to light.  Neck: Normal range of motion. Neck supple.  Cardiovascular: Normal rate and regular rhythm.   Respiratory: Effort normal and breath sounds  normal.  GI: Bowel sounds are normal. She exhibits no distension and no mass. There is tenderness. There is guarding. There is no rebound.       Morbidly obese, diffusely tender with gaurding but without rebound or rigidity  Musculoskeletal: Normal range of motion.  Neurological: She is alert and oriented to person, place, and time.  Skin: Skin is warm and dry.  Psychiatric: She has a normal mood and affect. Her behavior is normal. Judgment and thought content normal.   Assessment/Plan: 1) Severe nausea and vomiting with abdominal pain in a 36 year old non-compliant diabetic; agree with ruling out gastroparesis [needs to have the study done off of all narcotics] and prokinetics.  2) Morbid obesity.  3) HTN/Hyperlipidemia.  4) AODM.  5) Interstitial cystitis. [name] Kitchen Marissa Barrett 05/06/2012, 6:35 PM

## 2012-05-07 ENCOUNTER — Inpatient Hospital Stay (HOSPITAL_COMMUNITY): Payer: Medicaid Other

## 2012-05-07 LAB — GLUCOSE, CAPILLARY
Glucose-Capillary: 127 mg/dL — ABNORMAL HIGH (ref 70–99)
Glucose-Capillary: 168 mg/dL — ABNORMAL HIGH (ref 70–99)
Glucose-Capillary: 98 mg/dL (ref 70–99)
Glucose-Capillary: 70 mg/dL (ref 70–99)

## 2012-05-07 LAB — BASIC METABOLIC PANEL
BUN: 5 mg/dL — ABNORMAL LOW (ref 6–23)
Calcium: 9.2 mg/dL (ref 8.4–10.5)
Chloride: 104 mEq/L (ref 96–112)
Creatinine, Ser: 0.41 mg/dL — ABNORMAL LOW (ref 0.50–1.10)
GFR calc Af Amer: >90 mL/min (ref >90)

## 2012-05-07 MED ORDER — POTASSIUM CHLORIDE 10 MEQ/100ML IV SOLN
10.0000 meq | INTRAVENOUS | Status: AC
  Administered 2012-05-07 (×4): 10 meq via INTRAVENOUS
  Filled 2012-05-07: qty 400

## 2012-05-07 MED ORDER — SODIUM CHLORIDE 0.9 % IV SOLN
INTRAVENOUS | Status: DC
  Administered 2012-05-07: 12:00:00 via INTRAVENOUS

## 2012-05-07 MED ORDER — DEXTROSE-NACL 5-0.9 % IV SOLN
INTRAVENOUS | Status: DC
  Administered 2012-05-07: 22:00:00 via INTRAVENOUS

## 2012-05-07 MED ORDER — ATORVASTATIN CALCIUM 10 MG PO TABS
10.0000 mg | ORAL_TABLET | Freq: Every day | ORAL | Status: DC
  Filled 2012-05-07 (×3): qty 1

## 2012-05-07 MED ORDER — SODIUM CHLORIDE 0.9 % IV SOLN
250.0000 mg | Freq: Four times a day (QID) | INTRAVENOUS | Status: DC
  Administered 2012-05-07 – 2012-05-09 (×8): 250 mg via INTRAVENOUS
  Filled 2012-05-07 (×15): qty 250

## 2012-05-07 MED ORDER — METOCLOPRAMIDE HCL 5 MG/ML IJ SOLN
10.0000 mg | Freq: Three times a day (TID) | INTRAMUSCULAR | Status: DC
  Administered 2012-05-07 – 2012-05-09 (×7): 10 mg via INTRAVENOUS
  Filled 2012-05-07 (×9): qty 2

## 2012-05-07 NOTE — Progress Notes (Addendum)
[facility]                                                                                Patient Demographics  Marissa Barrett, is a 36 y.o. female  ZOX:[id number]  [id number]  DOB - 1976-04-20  Admit date - 05/05/2012  Admitting Physician Eduard Clos, MD  Outpatient Primary MD for the patient is DEFAULT,PROVIDER, MD  LOS - 2   Chief Complaint  Patient presents with  . Abdominal Pain        Assessment & Plan   36 year old diabetic female admitted for nausea vomiting ongoing on intermittent basis for 2 months, had some GI workup done at [facility], KUB showed large amount of stool was given enema with good results, however nausea vomiting did not improve and she continues to be nauseous, GI was consulted who suggested gastric emptying study which will be done today, if positive Reglan will be restarted, liver enzymes and lipase stable, does say that she has history of H. pylori for which IV PPI has been started with little benefit, we'll wait for further GI input.    1. Persistent Nausea and vomiting for the last 2-3 months, patient reportedly has history of H. pylori diagnosed at [facility], no relief with supportive care, KUB revealed large stool, enema resulted in large amount of stool removal yesterday however patient continues to be nauseated, does have history of diabetes mellitus, per GI gastric emptying study ordered 4 05/07/2012, she is on IV PPI, continue when necessary Zofran, liver enzymes and lipase are stable, appreciate GI input, Reglan DC'd it she completes her gastric emptying study.   2. History of diabetes mellitus type 2 and dyslipidemia - home medications to be continued as tolerated, CBGs and sliding scale insulin every 4 as by mouth intake is minimal. Diabetes has been in very poor control patient has been counseled, she is refusing sliding scale insulin here. Have reduced her 7030 dose with holding parameters.  Lab Results    Component Value Date   HGBA1C 12.3* 05/05/2012    CBG (last 3)   Basename 05/07/12 0826 05/07/12 0402 05/07/12  GLUCAP 168* 127* 120*     3. Low potassium replaced IV will be rechecked again in the morning on with magnesium.    4. Hypertension will adjust medications and write for when necessary IV hydralazine also.    Code Status: Full  Family Communication: Discussed with the patient  Disposition Plan: Home    Procedures  Gasdt emtying study to done 05-07-12   Consults  GI- Mann   Time Spent in minutes  35   Antibiotics     Anti-infectives    None      Scheduled Meds:    . docusate sodium  100 mg Oral BID  . insulin aspart  0-9 Units Subcutaneous Q4H  . insulin aspart protamine-insulin aspart  60 Units Subcutaneous BID WC  . lisinopril  10 mg Oral Daily  . pantoprazole (PROTONIX) IV  80 mg Intravenous Once  . pantoprazole (PROTONIX) IV  40 mg Intravenous Q12H  . polyethylene glycol  17 g Oral Daily  . potassium chloride  10 mEq Intravenous Q1 Hr x 4  .  potassium chloride  10 mEq Intravenous Q1 Hr x 4  . simvastatin  20 mg Oral QPM  . DISCONTD: metoCLOPramide (REGLAN) injection  10 mg Intravenous Q8H   Continuous Infusions:    . sodium chloride 100 mL/hr at 05/06/12 1301  . sodium chloride    . DISCONTD: 0.9 % NaCl with KCl 20 mEq / L 75 mL/hr at 05/07/12 0538   PRN Meds:.acetaminophen, acetaminophen, albuterol, fentaNYL, hydrALAZINE, ondansetron (ZOFRAN) IV, ondansetron, promethazine   DVT Prophylaxis    SCDs    Lab Results  Component Value Date   PLT 273 05/06/2012      Susa Raring K M.D on 05/07/2012 at 10:10 AM  Between 7am to 7pm - Pager - [phone]  After 7pm go to [email or web address] - password [email or web address]  And look for the night coverage person covering for me after hours  [facility] Office  [phone]    Subjective:   Marissa Barrett today has, No headache, No chest pain, No abdominal pain - + Nausea, No new  weakness tingling or numbness, No Cough - SOB.    Objective:   Filed Vitals:   05/06/12 1354 05/06/12 1741 05/06/12 2252 05/07/12 0542  BP: 144/93 158/96 154/88 134/81  Pulse: 93 91 96 87  Temp: 98.1 F (36.7 C) 98.4 F (36.9 C) 99.9 F (37.7 C) 99.2 F (37.3 C)  TempSrc: Oral Oral Oral Oral  Resp: 18 18 16 17   Height:      Weight:      SpO2: 100% 99% 98% 100%    Wt Readings from Last 3 Encounters:  05/05/12 104.327 kg (230 lb)  04/05/12 104.327 kg (230 lb)  03/21/12 104.327 kg (230 lb)     Intake/Output Summary (Last 24 hours) at 05/07/12 1010 Last data filed at 05/07/12 0830  Gross per 24 hour  Intake 1961.17 ml  Output   3000 ml  Net -1038.83 ml    Exam Awake Alert, Oriented X 3, No new F.N deficits, Normal affect [place].AT,PERRAL Supple Neck,No JVD, No cervical lymphadenopathy appriciated.  Symmetrical Chest wall movement, Good air movement bilaterally, CTAB RRR,No Gallops,Rubs or new Murmurs, No Parasternal Heave +ve B.Sounds, Abd Soft, Non tender, No organomegaly appriciated, No rebound - guarding or rigidity. No Cyanosis, Clubbing or edema, No new Rash or bruise      Data Review   Micro Results No results found for this or any previous visit (from the past 240 hour(s)).  Radiology Reports Dg Abd Acute W/chest  05/05/2012  *RADIOLOGY REPORT*  Clinical Data: Nausea, abdominal pain, vomit  ACUTE ABDOMEN SERIES (ABDOMEN 2 VIEW & CHEST 1 VIEW)  Comparison: Pain 04/05/2012  Findings: Cardiomediastinal silhouette is stable.  Central mild bronchitic changes.  No focal infiltrate.  There is nonspecific nonobstructive bowel gas pattern.  Moderate colonic stool.  Again noted stimulation device in right sacrum.  No free abdominal air.  IMPRESSION: No focal infiltrate.  Central mild bronchitic changes.  Nonspecific nonobstructive bowel gas pattern.  Moderate colonic stool.  No free abdominal air.   Original Report Authenticated By: Natasha Mead, M.D.     CBC  Lab 05/06/12  0535 05/05/12 0024  WBC 8.0 7.6  HGB 14.1 16.3*  HCT 39.3 45.1  PLT 273 292  MCV 78.9 77.0*  MCH 28.3 27.8  MCHC 35.9 36.1*  RDW 12.4 12.2  LYMPHSABS -- 2.1  MONOABS -- 0.3  EOSABS -- 0.0  BASOSABS -- 0.0  BANDABS -- --    Chemistries   Lab 05/07/12 0513 05/06/12  2130 05/05/12 0428 05/05/12 0024  NA 139 139 139 134*  K 3.0* 3.2* 3.6 3.7  CL 104 105 105 98  CO2 23 22 21  16*  GLUCOSE 124* 220* 273* 401*  BUN 5* 5* 8 9  CREATININE 0.41* 0.39* 0.37* 0.30*  CALCIUM 9.2 8.8 8.5 9.6  MG -- -- -- --  AST -- 17 -- 19  ALT -- 18 -- 20  ALKPHOS -- 75 -- 93  BILITOT -- 0.3 -- 0.4   ------------------------------------------------------------------------------------------------------------------ estimated creatinine clearance is 117.5 ml/min (by C-G formula based on Cr of 0.41). ------------------------------------------------------------------------------------------------------------------  [facility] 05/05/12 0024  HGBA1C 12.3*   ------------------------------------------------------------------------------------------------------------------ No results found for this basename: CHOL:2,HDL:2,LDLCALC:2,TRIG:2,CHOLHDL:2,LDLDIRECT:2 in the last 72 hours ------------------------------------------------------------------------------------------------------------------ No results found for this basename: TSH,T4TOTAL,FREET3,T3FREE,THYROIDAB in the last 72 hours ------------------------------------------------------------------------------------------------------------------ No results found for this basename: VITAMINB12:2,FOLATE:2,FERRITIN:2,TIBC:2,IRON:2,RETICCTPCT:2 in the last 72 hours  Coagulation profile No results found for this basename: INR:5,PROTIME:5 in the last 168 hours  No results found for this basename: DDIMER:2 in the last 72 hours  Cardiac Enzymes No results found for this basename: CK:3,CKMB:3,TROPONINI:3,MYOGLOBIN:3 in the last 168  hours ------------------------------------------------------------------------------------------------------------------ No components found with this basename: POCBNP:3

## 2012-05-07 NOTE — Progress Notes (Signed)
Subjective: Unable to tolerate gastric emptying scan.  Objective: Vital signs in last 24 hours: Temp:  [98.4 F (36.9 C)-99.9 F (37.7 C)] 98.5 F (36.9 C) (10/24 1300) Pulse Rate:  [87-96] 89  (10/24 1300) Resp:  [16-18] 18  (10/24 1300) BP: (134-158)/(76-96) 134/76 mmHg (10/24 1300) SpO2:  [98 %-100 %] 100 % (10/24 1300) Last BM Date: 05/05/12  Intake/Output from previous day: 10/23 0701 - 10/24 0700 In: 2061.2 [P.O.:150; I.V.:1399.2; IV Piggyback:512] Out: 3600 [Urine:2750; Emesis/NG output:850] Intake/Output this shift: Total I/O In: 717.9 [I.V.:717.9] Out: 1000 [Urine:1000]  General appearance: alert and no distress GI: soft, non-tender; bowel sounds normal; no masses,  no organomegaly  Lab Results:  Basename 05/06/12 0535 05/05/12 0024  WBC 8.0 7.6  HGB 14.1 16.3*  HCT 39.3 45.1  PLT 273 292   BMET  Basename 05/07/12 0513 05/06/12 0535 05/05/12 0428  NA 139 139 139  K 3.0* 3.2* 3.6  CL 104 105 105  CO2 23 22 21   GLUCOSE 124* 220* 273*  BUN 5* 5* 8  CREATININE 0.41* 0.39* 0.37*  CALCIUM 9.2 8.8 8.5   LFT  Basename 05/06/12 0535  PROT 6.9  ALBUMIN 3.1*  AST 17  ALT 18  ALKPHOS 75  BILITOT 0.3  BILIDIR --  IBILI --   PT/INR No results found for this basename: LABPROT:2,INR:2 in the last 72 hours Hepatitis Panel No results found for this basename: HEPBSAG,HCVAB,HEPAIGM,HEPBIGM in the last 72 hours C-Diff No results found for this basename: CDIFFTOX:3 in the last 72 hours Fecal Lactopherrin No results found for this basename: FECLLACTOFRN in the last 72 hours  Studies/Results: No results found.  Medications:  Scheduled:   . docusate sodium  100 mg Oral BID  . insulin aspart  0-9 Units Subcutaneous Q4H  . insulin aspart protamine-insulin aspart  60 Units Subcutaneous BID WC  . lisinopril  10 mg Oral Daily  . pantoprazole (PROTONIX) IV  40 mg Intravenous Q12H  . polyethylene glycol  17 g Oral Daily  . potassium chloride  10 mEq  Intravenous Q1 Hr x 4  . simvastatin  20 mg Oral QPM  . DISCONTD: metoCLOPramide (REGLAN) injection  10 mg Intravenous Q8H   Continuous:   . sodium chloride 100 mL/hr at 05/07/12 1159  . DISCONTD: 0.9 % NaCl with KCl 20 mEq / L 75 mL/hr at 05/07/12 0538    Assessment/Plan: 1) Nausea/Vomiting. 2) Poorly controlled DM.   The patient was not able to tolerate the gastric emptying scan.  I feel she has gastroparesis with her poorly controlled DM as evidenced by the elevated A1C at 12%.  I will give her a try of erythromycin to help her gastric motility.  Plan: 1) Erythromycin IV. 2) Resume reglan.  LOS: 2 days   Marissa Barrett D 05/07/2012, 5:25 PM

## 2012-05-07 NOTE — Progress Notes (Signed)
Pt was unable to keep "egg with contrast" that is given by nuc med for the gastric emptying scan, pt vomitted it back up.  Num med unable to do scan. Dr Thedore Mins was notified and then Dr Elnoria Howard, GI . Dr Elnoria Howard will be up to see pt today. Pt had requested ice chips, Dr Elnoria Howard ok with ice chips at this time.

## 2012-05-07 NOTE — Progress Notes (Signed)
Notified physician on call that patient blood sugar is was 70 at 1958 and that she has been trending down from 98 at 1606. Also made physician aware of pt NPO status and that she received a dose of novolog 70/30 at 1709. Received orders to change fluids to D5NS at 75.

## 2012-05-07 NOTE — Progress Notes (Signed)
Clinically equivalent dose of Lipitor has been substituted for Zocor per [facility] P&T committee policy.   The use of simvastatin concomitantly with the Erythromycin is contraindicated thus pharmacy has substituted Atorvastatin 10ng for Simvastatin 20 mg per P &T approved substitution policy  Noah Delaine, RPh Clinical Pharmacist Pager: [phone] 05/07/2012 19:18 PM

## 2012-05-08 DIAGNOSIS — R112 Nausea with vomiting, unspecified: Secondary | ICD-10-CM

## 2012-05-08 LAB — BASIC METABOLIC PANEL
CO2: 24 mEq/L (ref 19–32)
Chloride: 101 mEq/L (ref 96–112)
GFR calc Af Amer: >90 mL/min (ref >90)
Potassium: 2.8 mEq/L — ABNORMAL LOW (ref 3.5–5.1)
Sodium: 136 mEq/L (ref 135–145)

## 2012-05-08 LAB — MAGNESIUM: Magnesium: 1.7 mg/dL (ref 1.5–2.5)

## 2012-05-08 LAB — GLUCOSE, CAPILLARY
Glucose-Capillary: 128 mg/dL — ABNORMAL HIGH (ref 70–99)
Glucose-Capillary: 157 mg/dL — ABNORMAL HIGH (ref 70–99)
Glucose-Capillary: 116 mg/dL — ABNORMAL HIGH (ref 70–99)
Glucose-Capillary: 94 mg/dL (ref 70–99)

## 2012-05-08 MED ORDER — POTASSIUM CHLORIDE IN NACL 40-0.9 MEQ/L-% IV SOLN
INTRAVENOUS | Status: DC
  Administered 2012-05-08 – 2012-05-10 (×4): via INTRAVENOUS
  Filled 2012-05-08 (×7): qty 1000

## 2012-05-08 MED ORDER — HYDROMORPHONE HCL PF 1 MG/ML IJ SOLN
0.5000 mg | Freq: Once | INTRAMUSCULAR | Status: AC
  Administered 2012-05-08: 0.5 mg via INTRAVENOUS
  Filled 2012-05-08: qty 1

## 2012-05-08 MED ORDER — INSULIN ASPART PROT & ASPART (70-30 MIX) 100 UNIT/ML ~~LOC~~ SUSP
35.0000 [IU] | Freq: Two times a day (BID) | SUBCUTANEOUS | Status: DC
  Administered 2012-05-08 – 2012-05-10 (×3): 35 [IU] via SUBCUTANEOUS
  Administered 2012-05-10: 25 [IU] via SUBCUTANEOUS
  Filled 2012-05-08: qty 3

## 2012-05-08 NOTE — Progress Notes (Signed)
Subjective: Nausea and vomiting this AM.  Objective: Vital signs in last 24 hours: Temp:  [98.5 F (36.9 C)-99.7 F (37.6 C)] 99.1 F (37.3 C) (10/25 1610) Pulse Rate:  [85-90] 85  (10/25 0633) Resp:  [18] 18  (10/25 0633) BP: (134-136)/(76-81) 136/79 mmHg (10/25 0633) SpO2:  [97 %-100 %] 98 % (10/25 9604) Last BM Date: 05/05/12  Intake/Output from previous day: 10/24 0701 - 10/25 0700 In: 3094.9 [I.V.:2994.9; IV Piggyback:100] Out: 2000 [Urine:2000] Intake/Output this shift:    General appearance: alert and mild distress GI: diffusely tender  Lab Results:  [facility] 05/06/12 0535  WBC 8.0  HGB 14.1  HCT 39.3  PLT 273   BMET  Basename 05/08/12 0625 05/07/12 0513 05/06/12 0535  NA 136 139 139  K 2.8* 3.0* 3.2*  CL 101 104 105  CO2 24 23 22   GLUCOSE 136* 124* 220*  BUN 9 5* 5*  CREATININE 0.46* 0.41* 0.39*  CALCIUM 9.1 9.2 8.8   LFT  Basename 05/06/12 0535  PROT 6.9  ALBUMIN 3.1*  AST 17  ALT 18  ALKPHOS 75  BILITOT 0.3  BILIDIR --  IBILI --   PT/INR No results found for this basename: LABPROT:2,INR:2 in the last 72 hours Hepatitis Panel No results found for this basename: HEPBSAG,HCVAB,HEPAIGM,HEPBIGM in the last 72 hours C-Diff No results found for this basename: CDIFFTOX:3 in the last 72 hours Fecal Lactopherrin No results found for this basename: FECLLACTOFRN in the last 72 hours  Studies/Results: No results found.  Medications:  Scheduled:   . atorvastatin  10 mg Oral q1800  . docusate sodium  100 mg Oral BID  . erythromycin  250 mg Intravenous Q6H  .  HYDROmorphone (DILAUDID) injection  0.5 mg Intravenous Once  . insulin aspart  0-9 Units Subcutaneous Q4H  . insulin aspart protamine-insulin aspart  60 Units Subcutaneous BID WC  . lisinopril  10 mg Oral Daily  . metoCLOPramide (REGLAN) injection  10 mg Intravenous Q8H  . pantoprazole (PROTONIX) IV  40 mg Intravenous Q12H  . polyethylene glycol  17 g Oral Daily  . potassium chloride   10 mEq Intravenous Q1 Hr x 4  . DISCONTD: metoCLOPramide (REGLAN) injection  10 mg Intravenous Q8H  . DISCONTD: simvastatin  20 mg Oral QPM   Continuous:   . dextrose 5 % and 0.9% NaCl 75 mL/hr at 05/07/12 2157  . DISCONTD: sodium chloride 100 mL/hr at 05/07/12 1159    Assessment/Plan: 1) Presumed gastroparesis. 2) Nausea and vomiting.   No significant change overnight.  Hopefully she can gain some efficacy with the Reglan and erythromycin combination.  If there is no significant change an EGD will need to be performed to make sure that there is no other abnormality.  Plan: 1) Continue with Reglan and erythromycin.  LOS: 3 days   Marissa Barrett D 05/08/2012, 9:09 AM

## 2012-05-08 NOTE — Progress Notes (Signed)
[facility] PROGRESS NOTE  Marissa Barrett ZOX:[id number] DOB: [birth year] DOA: 05/05/2012 PCP: Sheila Oats, MD  Assessment/Plan: Principal Problem:  *Nausea and vomiting Active Problems:  DM2 (diabetes mellitus, type 2)  Hyperlipidemia     36 year old diabetic female admitted for nausea vomiting ongoing on intermittent basis for 2 months, had some GI workup done at [facility], KUB showed large amount of stool was given enema with good results, however nausea vomiting did not improve and she continues to be nauseous, GI was consulted who suggested gastric emptying study which will be done today, if positive Reglan will be restarted, liver enzymes and lipase stable, does say that she has history of H. pylori for which IV PPI has been started with little benefit, we'll wait for further GI input.    1. Persistent Nausea and vomiting for the last 2-3 months, patient reportedly has history of H. pylori diagnosed at [facility], no relief with supportive care, KUB revealed large stool, enema resulted in large amount of stool removal yesterday however patient continues to be nauseated, does have history of diabetes mellitus, unable to tolerate the gastric emptying study on 05/07/2012, she is on IV PPI, continue when necessary Zofran, erythromycin, liver enzymes and lipase are stable, appreciate GI input, Reglan resumed, possible EGD   2. History of diabetes mellitus type 2 and dyslipidemia - NovoLog decrease given the patient is n.p.o. tolerated, CBGs and sliding scale insulin every 4 as by mouth intake is minimal. Diabetes has been in very poor control patient has been counseled, she is refusing sliding scale insulin here.   Lab Results   Component  Value  Date    HGBA1C  12.3*  05/05/2012    CBG (last 3)   Basename  05/07/12 0826  05/07/12 0402  05/07/12   GLUCAP  168*  127*  120*    3. Low potassium replaced IV will be rechecked again in the morning on with magnesium. Replete  potassium    4. Hypertension will adjust medications and write for when necessary IV hydralazine also.   Code Status: Full  Family Communication: Discussed with the patient  Disposition Plan: Home  Procedures Gasdt emtying study to done 05-07-12  Consults GI- Mann     HPI/Subjective: Nausea and vomiting this AM.   Objective: Filed Vitals:   05/07/12 0542 05/07/12 1300 05/07/12 2218 05/08/12 0633  BP: 134/81 134/76 134/81 136/79  Pulse: 87 89 90 85  Temp: 99.2 F (37.3 C) 98.5 F (36.9 C) 99.7 F (37.6 C) 99.1 F (37.3 C)  TempSrc: Oral Oral    Resp: 17 18 18 18   Height:      Weight:      SpO2: 100% 100% 97% 98%    Intake/Output Summary (Last 24 hours) at 05/08/12 1102 Last data filed at 05/08/12 0544  Gross per 24 hour  Intake 3094.92 ml  Output   1000 ml  Net 2094.92 ml    Exam:  HENT:  Head: Normocephalic and atraumatic.  Eyes: Conjunctivae normal and EOM are normal. Pupils are equal, round, and reactive to light.  Neck: Normal range of motion. Neck supple.  Cardiovascular: Normal rate and regular rhythm.  Respiratory: Effort normal and breath sounds normal.  GI: Bowel sounds are normal. She exhibits no distension and no mass. There is tenderness. There is guarding. There is no rebound.  Morbidly obese, diffusely tender with gaurding but without rebound or rigidity  Musculoskeletal: Normal range of motion.  Neurological: She is alert and oriented to person, place, and  time.     Data Reviewed: Basic Metabolic Panel:  Lab 05/08/12 1324 05/07/12 0513 05/06/12 0535 05/05/12 0428 05/05/12 0024  NA 136 139 139 139 134*  K 2.8* 3.0* 3.2* 3.6 3.7  CL 101 104 105 105 98  CO2 24 23 22 21  16*  GLUCOSE 136* 124* 220* 273* 401*  BUN 9 5* 5* 8 9  CREATININE 0.46* 0.41* 0.39* 0.37* 0.30*  CALCIUM 9.1 9.2 8.8 8.5 9.6  MG 1.7 -- -- -- --  PHOS -- -- -- -- --    Liver Function Tests:  Lab 05/06/12 0535 05/05/12 0024  AST 17 19  ALT 18 20  ALKPHOS 75 93    BILITOT 0.3 0.4  PROT 6.9 8.2  ALBUMIN 3.1* 3.8    Lab 05/05/12 0024  LIPASE 39  AMYLASE --   No results found for this basename: AMMONIA:5 in the last 168 hours  CBC:  Lab 05/06/12 0535 05/05/12 0024  WBC 8.0 7.6  NEUTROABS -- 5.1  HGB 14.1 16.3*  HCT 39.3 45.1  MCV 78.9 77.0*  PLT 273 292    Cardiac Enzymes: No results found for this basename: CKTOTAL:5,CKMB:5,CKMBINDEX:5,TROPONINI:5 in the last 168 hours BNP (last 3 results) No results found for this basename: PROBNP:3 in the last 8760 hours   CBG:  Lab 05/08/12 0806 05/08/12 0407 05/08/12 0004 05/07/12 1958 05/07/12 1606  GLUCAP 157* 128* 98 70 98    No results found for this or any previous visit (from the past 240 hour(s)).   Studies: Dg Abd Acute W/chest  05/05/2012  *RADIOLOGY REPORT*  Clinical Data: Nausea, abdominal pain, vomit  ACUTE ABDOMEN SERIES (ABDOMEN 2 VIEW & CHEST 1 VIEW)  Comparison: Pain 04/05/2012  Findings: Cardiomediastinal silhouette is stable.  Central mild bronchitic changes.  No focal infiltrate.  There is nonspecific nonobstructive bowel gas pattern.  Moderate colonic stool.  Again noted stimulation device in right sacrum.  No free abdominal air.  IMPRESSION: No focal infiltrate.  Central mild bronchitic changes.  Nonspecific nonobstructive bowel gas pattern.  Moderate colonic stool.  No free abdominal air.   Original Report Authenticated By: Natasha Mead, M.D.     Scheduled Meds:   . atorvastatin  10 mg Oral q1800  . docusate sodium  100 mg Oral BID  . erythromycin  250 mg Intravenous Q6H  .  HYDROmorphone (DILAUDID) injection  0.5 mg Intravenous Once  . insulin aspart  0-9 Units Subcutaneous Q4H  . insulin aspart protamine-insulin aspart  35 Units Subcutaneous BID WC  . lisinopril  10 mg Oral Daily  . metoCLOPramide (REGLAN) injection  10 mg Intravenous Q8H  . pantoprazole (PROTONIX) IV  40 mg Intravenous Q12H  . polyethylene glycol  17 g Oral Daily  . potassium chloride  10 mEq  Intravenous Q1 Hr x 4  . DISCONTD: insulin aspart protamine-insulin aspart  60 Units Subcutaneous BID WC  . DISCONTD: simvastatin  20 mg Oral QPM   Continuous Infusions:   . 0.9 % NaCl with KCl 40 mEq / L    . DISCONTD: sodium chloride 100 mL/hr at 05/07/12 1159  . DISCONTD: dextrose 5 % and 0.9% NaCl 75 mL/hr at 05/07/12 2157    Principal Problem:  *Nausea and vomiting Active Problems:  DM2 (diabetes mellitus, type 2)  Hyperlipidemia    Time spent: 40 minutes   [facility]  [facility] Pager [phone]. If 8PM-8AM, please contact night-coverage at [email or web address], password [facility] 05/08/2012, 11:02 AM  LOS: 3 days

## 2012-05-08 NOTE — Progress Notes (Signed)
Notified physician of pt complaint of 10/10 pain even after a dose of fentanyl and also continued dry heaves and small amounts of emesis. Received order for a one time dose of Dilaudid.

## 2012-05-08 NOTE — Care Management Note (Signed)
  Page 1 of 1   05/08/2012     2:16:22 PM   CARE MANAGEMENT NOTE 05/08/2012  Patient:  Marissa Barrett, Marissa Barrett   Account Number:  [id number]  Date Initiated:  05/08/2012  Documentation initiated by:  Ronny Flurry  Subjective/Objective Assessment:   Nausea and vomiting     Action/Plan:   Anticipated DC Date:     Anticipated DC Plan:  HOME/SELF CARE         Choice offered to / List presented to:             Status of service:   Medicare Important Message given?   (If response is "NO", the following Medicare IM given date fields will be blank) Date Medicare IM given:   Date Additional Medicare IM given:    Discharge Disposition:    Per UR Regulation:    If discussed at Long Length of Stay Meetings, dates discussed:    Comments:   Diabetes Coordinator's note : Not well controlled at home as evidenced by A1c of 12.3% (05/05/12).  Recently lost her job and her insurance. Patient stated she applied for Medicaid back in mid September.  Won't know if she qualifies for Medicaid for another few weeks.  Patient stated her insulin out of pocket is expensive.  Patient could be switched to the Novolin 70/30 insulin in the interim (Novolin 70/30 insulin can be purchased at [facility] for $24.88 per vial with a physician's Rx).   05-08-12 Patient currently on sliding scale novolog and Novolog 70/30. Spoke with patient and her mother. Gave them coupon for free vial of Novolog insulin and assistance application for Novolog 70/30 . Both voiced understanding  Patient states she does have a PCP  Ronny Flurry RN BSN [phone]

## 2012-05-09 ENCOUNTER — Encounter (HOSPITAL_COMMUNITY)
Admission: EM | Disposition: A | Discharge status: Home / Self Care | Payer: Self-pay | Source: Home / Self Care | Attending: Internal Medicine

## 2012-05-09 ENCOUNTER — Encounter (HOSPITAL_COMMUNITY): Payer: Self-pay | Admitting: *Deleted

## 2012-05-09 HISTORY — PX: ESOPHAGOGASTRODUODENOSCOPY: SHX5428

## 2012-05-09 LAB — BASIC METABOLIC PANEL
CO2: 24 mEq/L (ref 19–32)
Calcium: 9 mg/dL (ref 8.4–10.5)
Chloride: 100 mEq/L (ref 96–112)
Creatinine, Ser: 0.49 mg/dL — ABNORMAL LOW (ref 0.50–1.10)
Glucose, Bld: 125 mg/dL — ABNORMAL HIGH (ref 70–99)

## 2012-05-09 LAB — GLUCOSE, CAPILLARY
Glucose-Capillary: 124 mg/dL — ABNORMAL HIGH (ref 70–99)
Glucose-Capillary: 146 mg/dL — ABNORMAL HIGH (ref 70–99)
Glucose-Capillary: 174 mg/dL — ABNORMAL HIGH (ref 70–99)

## 2012-05-09 SURGERY — EGD (ESOPHAGOGASTRODUODENOSCOPY)
Anesthesia: Moderate Sedation

## 2012-05-09 MED ORDER — LIDOCAINE VISCOUS 2 % MT SOLN
OROMUCOSAL | Status: AC
  Filled 2012-05-09: qty 15

## 2012-05-09 MED ORDER — LIDOCAINE VISCOUS 2 % MT SOLN
OROMUCOSAL | Status: DC | PRN
  Administered 2012-05-09: 1 via OROMUCOSAL

## 2012-05-09 MED ORDER — DIPHENHYDRAMINE HCL 50 MG/ML IJ SOLN
INTRAMUSCULAR | Status: AC
  Filled 2012-05-09: qty 1

## 2012-05-09 MED ORDER — FENTANYL CITRATE 0.05 MG/ML IJ SOLN
INTRAMUSCULAR | Status: AC
  Filled 2012-05-09: qty 2

## 2012-05-09 MED ORDER — FENTANYL CITRATE 0.05 MG/ML IJ SOLN
INTRAMUSCULAR | Status: DC | PRN
  Administered 2012-05-09 (×4): 25 ug via INTRAVENOUS

## 2012-05-09 MED ORDER — MIDAZOLAM HCL 5 MG/ML IJ SOLN
INTRAMUSCULAR | Status: AC
  Filled 2012-05-09: qty 2

## 2012-05-09 MED ORDER — MIDAZOLAM HCL 10 MG/2ML IJ SOLN
INTRAMUSCULAR | Status: DC | PRN
  Administered 2012-05-09 (×5): 2 mg via INTRAVENOUS

## 2012-05-09 NOTE — Op Note (Signed)
[facility] [address] [place], [address]   OPERATIVE PROCEDURE REPORT  PATIENT :Marissa Barrett, Marissa Barrett  MR#: [id number] BIRTHDATE :[birth year] GENDER: Female ENDOSCOPIST: Dr.  Lorenza Burton, MD ASSISTANT:   Dorisann Frames, technician Judithann Sauger, RN PROCEDURE DATE: 06/01/2012 PRE-PROCEDURE PREPERATION: Patient fasted for 4 hours prior to procedure. PRE-PROCEDURE PHYSICAL: Patient has stable vital signs.  Neck is supple.  There is no JVD, thyromegaly or LAD.  Chest clear to auscultation.  S1 and S2 regular.  Abdomen soft, morbidly obese with epigastric and periumbilical tenderness on palpation, non-distended, non-tender with NABS. PROCEDURE: EGD, diagnostic ASA CLASS: Class II INDICATIONS: 1) Nausea and vomiting 2) Epigastric pain.[name] Kitchen MEDICATIONS: Fentanyl 100 mcg IV and Versed 10 mg IV TOPICAL ANESTHETIC:  Viscous Xylocaine -10 cc PO.  DESCRIPTION OF PROCEDURE: After the risks benefits and alternatives of the procedure were thoroughly explained, informed consent was obtained.  The Pentax Gastroscope [id number]  was introduced through the mouth and advanced to the second portion of the duodenum , without limitations. The instrument was slowly withdrawn as the mucosa was fully examined.    Except for patchy gastritis, the esophagus, GEJ, stomach and the proximal small bowel appeared normal. There were no ulcers, erosions, masses or polyps noted. Retroflexed views revealed no abnormalities. The scope was then withdrawn from the patient and the procedure terminated. The patient tolerated the procedure without immediate complications.  IMPRESSION: Patchy gastritis; otherwise, normal esophagogastroduodenoscopy.  RECOMMENDATIONS:1.  Anti-reflux regimen to be followed. 2.  Continue PPI's.  REPEAT EXAM:.  No repeat EGD planned.  DISCHARGE INSTRUCTIONS: standard discharge _______________________________ eSigned:  Dr. Lorenza Burton, MD 06/01/12 5:13  PM   CPT CODES: 21308, EGD    DIAGNOSIS CODES: 789.06 , 787.01  CC [facility]  n]  PATIENT NAME:  Marissa Barrett, Marissa Barrett MR#: [id number]

## 2012-05-09 NOTE — Progress Notes (Signed)
Back from Endo, cbg=174, NPO all day, pt stated can't keep anything down. Will give sched 70/30 now per MD

## 2012-05-09 NOTE — Progress Notes (Signed)
[facility] PROGRESS NOTE  Lorren Rossetti ONG:[id number] DOB: 06/13/1976 DOA: 05/05/2012 PCP: Sheila Oats, MD  Assessment/Plan: Principal Problem:  *Nausea and vomiting Active Problems:  DM2 (diabetes mellitus, type 2)  Hyperlipidemia    36 year old diabetic female admitted for nausea vomiting ongoing on intermittent basis for 2 months, had some GI workup done at [facility], KUB showed large amount of stool was given enema with good results, however nausea vomiting did not improve and she continues to be nauseous, GI was consulted who suggested gastric emptying study which will be done today, if positive Reglan will be restarted, liver enzymes and lipase stable, does say that she has history of H. pylori for which IV PPI has been started with little benefit, we'll wait for further GI input.    1. Persistent Nausea and vomiting for the last 2-3 months, patient reportedly has history of H. pylori diagnosed at [facility], no relief with supportive care, KUB revealed large stool, enema resulted in large amount of stool removal yesterday however patient continues to be nauseated, does have history of diabetes mellitus, unable to tolerate the gastric emptying study on 05/07/2012, she is on IV PPI, continue when necessary Zofran, erythromycin, liver enzymes and lipase are stable, appreciate GI input, Reglan resumed, EGD today by Dr. Loreta Ave   2. History of diabetes mellitus type 2 and dyslipidemia - NovoLog decrease given the patient is n.p.o. tolerated, CBGs and sliding scale insulin every 4 as by mouth intake is minimal. Diabetes has been in very poor control patient has been counseled, she is refusing sliding scale insulin here.  Lab Results   Component  Value  Date    HGBA1C  12.3*  05/05/2012   CBG (last 3)  Basename  05/07/12 0826  05/07/12 0402  05/07/12   GLUCAP  168*  127*  120*   3. Low potassium replaced IV will be rechecked again in the morning on with magnesium. Replete  potassium    4. Hypertension will adjust medications and write for when necessary IV hydralazine also.   5. Hypokalemia repleted will recheck being BMP in the morning  Code Status: Full  Family Communication: Discussed with the patient  Disposition Plan: Home  Procedures Gasdt emtying study to done 05-07-12  Consults GI- Mann    HPI/Subjective:  Nausea and vomiting this AM.    Objective: Filed Vitals:   05/08/12 0633 05/08/12 1400 05/08/12 2229 05/09/12 0552  BP: 136/79 138/79 126/82 141/79  Pulse: 85 93 94 88  Temp: 99.1 F (37.3 C) 98.7 F (37.1 C) 98.2 F (36.8 C) 99 F (37.2 C)  TempSrc:  Oral Oral Oral  Resp: 18 16 18 18   Height:      Weight:      SpO2: 98% 97% 100% 100%    Intake/Output Summary (Last 24 hours) at 05/09/12 0935 Last data filed at 05/09/12 0648  Gross per 24 hour  Intake   1976 ml  Output   1250 ml  Net    726 ml    Exam:  HENT:  Head: Normocephalic and atraumatic.  Eyes: Conjunctivae normal and EOM are normal. Pupils are equal, round, and reactive to light.  Neck: Normal range of motion. Neck supple.  Cardiovascular: Normal rate and regular rhythm.  Respiratory: Effort normal and breath sounds normal.  GI: Bowel sounds are normal. She exhibits no distension and no mass. There is tenderness. There is guarding. There is no rebound.  Morbidly obese, diffusely tender with gaurding but without rebound or rigidity  Musculoskeletal:  Normal range of motion.  Neurological: She is alert and oriented to person, place, and time.  .    Data Reviewed: Basic Metabolic Panel:  Lab 05/08/12 0454 05/07/12 0981 05/06/12 0535 05/05/12 0428 05/05/12 0024  NA 136 139 139 139 134*  K 2.8* 3.0* 3.2* 3.6 3.7  CL 101 104 105 105 98  CO2 24 23 22 21  16*  GLUCOSE 136* 124* 220* 273* 401*  BUN 9 5* 5* 8 9  CREATININE 0.46* 0.41* 0.39* 0.37* 0.30*  CALCIUM 9.1 9.2 8.8 8.5 9.6  MG 1.7 -- -- -- --  PHOS -- -- -- -- --    Liver Function Tests:  Lab  05/06/12 0535 05/05/12 0024  AST 17 19  ALT 18 20  ALKPHOS 75 93  BILITOT 0.3 0.4  PROT 6.9 8.2  ALBUMIN 3.1* 3.8    Lab 05/05/12 0024  LIPASE 39  AMYLASE --   No results found for this basename: AMMONIA:5 in the last 168 hours  CBC:  Lab 05/06/12 0535 05/05/12 0024  WBC 8.0 7.6  NEUTROABS -- 5.1  HGB 14.1 16.3*  HCT 39.3 45.1  MCV 78.9 77.0*  PLT 273 292    Cardiac Enzymes: No results found for this basename: CKTOTAL:5,CKMB:5,CKMBINDEX:5,TROPONINI:5 in the last 168 hours BNP (last 3 results) No results found for this basename: PROBNP:3 in the last 8760 hours   CBG:  Lab 05/09/12 0757 05/09/12 0356 05/08/12 2329 05/08/12 1959 05/08/12 1550  GLUCAP 124* 97 94 116* 181*    No results found for this or any previous visit (from the past 240 hour(s)).   Studies: Dg Abd Acute W/chest  05/05/2012  *RADIOLOGY REPORT*  Clinical Data: Nausea, abdominal pain, vomit  ACUTE ABDOMEN SERIES (ABDOMEN 2 VIEW & CHEST 1 VIEW)  Comparison: Pain 04/05/2012  Findings: Cardiomediastinal silhouette is stable.  Central mild bronchitic changes.  No focal infiltrate.  There is nonspecific nonobstructive bowel gas pattern.  Moderate colonic stool.  Again noted stimulation device in right sacrum.  No free abdominal air.  IMPRESSION: No focal infiltrate.  Central mild bronchitic changes.  Nonspecific nonobstructive bowel gas pattern.  Moderate colonic stool.  No free abdominal air.   Original Report Authenticated By: Natasha Mead, M.D.     Scheduled Meds:   . atorvastatin  10 mg Oral q1800  . docusate sodium  100 mg Oral BID  . erythromycin  250 mg Intravenous Q6H  . insulin aspart  0-9 Units Subcutaneous Q4H  . insulin aspart protamine-insulin aspart  35 Units Subcutaneous BID WC  . lisinopril  10 mg Oral Daily  . metoCLOPramide (REGLAN) injection  10 mg Intravenous Q8H  . pantoprazole (PROTONIX) IV  40 mg Intravenous Q12H  . polyethylene glycol  17 g Oral Daily  . DISCONTD: insulin aspart  protamine-insulin aspart  60 Units Subcutaneous BID WC   Continuous Infusions:   . 0.9 % NaCl with KCl 40 mEq / L 75 mL/hr at 05/09/12 0348  . DISCONTD: dextrose 5 % and 0.9% NaCl 75 mL/hr at 05/07/12 2157    Principal Problem:  *Nausea and vomiting Active Problems:  DM2 (diabetes mellitus, type 2)  Hyperlipidemia    Time spent: 40 minutes   [facility]  [facility] Pager [phone]. If 8PM-8AM, please contact night-coverage at [email or web address], password [facility] 05/09/2012, 9:35 AM  LOS: 4 days

## 2012-05-10 LAB — GLUCOSE, CAPILLARY
Glucose-Capillary: 88 mg/dL (ref 70–99)
Glucose-Capillary: 162 mg/dL — ABNORMAL HIGH (ref 70–99)

## 2012-05-10 LAB — BASIC METABOLIC PANEL
CO2: 23 mEq/L (ref 19–32)
Calcium: 8.7 mg/dL (ref 8.4–10.5)
Chloride: 102 mEq/L (ref 96–112)
GFR calc Af Amer: >90 mL/min (ref >90)
Sodium: 137 mEq/L (ref 135–145)

## 2012-05-10 MED ORDER — METOCLOPRAMIDE HCL 10 MG PO TABS: 10.0000 mg | ORAL_TABLET | Freq: Four times a day (QID) | ORAL | Status: DC

## 2012-05-10 MED ORDER — INSULIN ASPART PROT & ASPART (70-30 MIX) 100 UNIT/ML ~~LOC~~ SUSP: 30.0000 [IU] | Freq: Two times a day (BID) | SUBCUTANEOUS | Status: DC

## 2012-05-10 MED ORDER — BISACODYL 10 MG RE SUPP
10.0000 mg | Freq: Once | RECTAL | Status: DC
Start: 2012-05-10 — End: 2012-05-10

## 2012-05-10 MED ORDER — POTASSIUM CHLORIDE 10 MEQ/100ML IV SOLN
INTRAVENOUS | Status: AC
  Administered 2012-05-10: 10 meq via INTRAVENOUS
  Filled 2012-05-10: qty 100

## 2012-05-10 MED ORDER — PANTOPRAZOLE SODIUM 40 MG PO TBEC: 40.0000 mg | DELAYED_RELEASE_TABLET | Freq: Every day | ORAL | Status: DC

## 2012-05-10 MED ORDER — POTASSIUM CHLORIDE 10 MEQ/100ML IV SOLN
10.0000 meq | INTRAVENOUS | Status: AC
  Administered 2012-05-10 (×4): 10 meq via INTRAVENOUS
  Filled 2012-05-10 (×2): qty 100

## 2012-05-10 MED ORDER — ERYTHROMYCIN BASE 500 MG PO TABS: 500.0000 mg | ORAL_TABLET | Freq: Three times a day (TID) | ORAL | Status: DC

## 2012-05-10 NOTE — Discharge Summary (Addendum)
Physician Discharge Summary  Marissa Barrett MRN: [id number] DOB/AGE: [birth year] 36 y.o.  PCP: Sheila Oats, MD   Admit date: 05/05/2012 Discharge date: 05/10/2012  Discharge Diagnoses:  Gastroparesis  *Nausea and vomiting Active Problems:  DM2 (diabetes mellitus, type 2)  Hyperlipidemia     Medication List     As of 05/10/2012  9:01 AM    TAKE these medications         acetaminophen 500 MG tablet   Commonly known as: TYLENOL   Take 1,000-1,500 mg by mouth every 6 (six) hours as needed. For pain      albuterol 108 (90 BASE) MCG/ACT inhaler   Commonly known as: PROVENTIL HFA;VENTOLIN HFA   Inhale 2 puffs into the lungs every 6 (six) hours as needed. Wheezing or shortness of breath      erythromycin base 500 MG tablet   Commonly known as: E-MYCIN   Take 1 tablet (500 mg total) by mouth 3 (three) times daily.      fluconazole 150 MG tablet   Commonly known as: DIFLUCAN   Take 150 mg by mouth once a week. On Tuesdays      HYDROcodone-acetaminophen 5-500 MG per tablet   Commonly known as: VICODIN   Take 1-2 tablets by mouth every 6 (six) hours as needed for pain.      insulin aspart protamine-insulin aspart (70-30) 100 UNIT/ML injection   Commonly known as: NOVOLOG 70/30   Inject 30 Units into the skin 2 (two) times daily with a meal.      lisinopril 10 MG tablet   Commonly known as: PRINIVIL,ZESTRIL   Take 1 tablet (10 mg total) by mouth daily.      metoCLOPramide 10 MG tablet   Commonly known as: REGLAN   Take 1 tablet (10 mg total) by mouth 4 (four) times daily.      pantoprazole 40 MG tablet   Commonly known as: PROTONIX   Take 1 tablet (40 mg total) by mouth daily.      promethazine 25 MG tablet   Commonly known as: PHENERGAN   Take 25 mg by mouth every 6 (six) hours as needed.      simvastatin 20 MG tablet   Commonly known as: ZOCOR   Take 20 mg by mouth every evening.       lisinopril 5 mg by mouth daily    Discharge Condition:  Stable   Disposition: 01-Home or Self Care   Consults:  #1 gastroenterology  Significant Diagnostic Studies: Dg Abd Acute W/chest  05/05/2012  *RADIOLOGY REPORT*  Clinical Data: Nausea, abdominal pain, vomit  ACUTE ABDOMEN SERIES (ABDOMEN 2 VIEW & CHEST 1 VIEW)  Comparison: Pain 04/05/2012  Findings: Cardiomediastinal silhouette is stable.  Central mild bronchitic changes.  No focal infiltrate.  There is nonspecific nonobstructive bowel gas pattern.  Moderate colonic stool.  Again noted stimulation device in right sacrum.  No free abdominal air.  IMPRESSION: No focal infiltrate.  Central mild bronchitic changes.  Nonspecific nonobstructive bowel gas pattern.  Moderate colonic stool.  No free abdominal air.   Original Report Authenticated By: Natasha Mead, M.D.      Microbiology: No results found for this or any previous visit (from the past 240 hour(s)).   Labs: Results for orders placed during the hospital encounter of 05/05/12 (from the past 48 hour(s))  GLUCOSE, CAPILLARY     Status: Abnormal   Collection Time   05/08/12 12:08 PM      Component Value Range Comment  Glucose-Capillary 184 (*) 70 - 99 mg/dL    Comment 1 Notify RN     GLUCOSE, CAPILLARY     Status: Abnormal   Collection Time   05/08/12  3:50 PM      Component Value Range Comment   Glucose-Capillary 181 (*) 70 - 99 mg/dL   GLUCOSE, CAPILLARY     Status: Abnormal   Collection Time   05/08/12  7:59 PM      Component Value Range Comment   Glucose-Capillary 116 (*) 70 - 99 mg/dL   GLUCOSE, CAPILLARY     Status: Normal   Collection Time   05/08/12 11:29 PM      Component Value Range Comment   Glucose-Capillary 94  70 - 99 mg/dL    Comment 1 Documented in Chart      Comment 2 Notify RN     GLUCOSE, CAPILLARY     Status: Normal   Collection Time   05/09/12  3:56 AM      Component Value Range Comment   Glucose-Capillary 97  70 - 99 mg/dL    Comment 1 Documented in Chart      Comment 2 Notify RN     GLUCOSE,  CAPILLARY     Status: Abnormal   Collection Time   05/09/12  7:57 AM      Component Value Range Comment   Glucose-Capillary 124 (*) 70 - 99 mg/dL    Comment 1 Notify RN     BASIC METABOLIC PANEL     Status: Abnormal   Collection Time   05/09/12  9:10 AM      Component Value Range Comment   Sodium 134 (*) 135 - 145 mEq/L    Potassium 3.1 (*) 3.5 - 5.1 mEq/L    Chloride 100  96 - 112 mEq/L    CO2 24  19 - 32 mEq/L    Glucose, Bld 125 (*) 70 - 99 mg/dL    BUN 10  6 - 23 mg/dL    Creatinine, Ser 4.09 (*) 0.50 - 1.10 mg/dL    Calcium 9.0  8.4 - 81.1 mg/dL    GFR calc non Af Amer >90  >90 mL/min    GFR calc Af Amer >90  >90 mL/min   GLUCOSE, CAPILLARY     Status: Abnormal   Collection Time   05/09/12 12:07 PM      Component Value Range Comment   Glucose-Capillary 146 (*) 70 - 99 mg/dL    Comment 1 Notify RN     GLUCOSE, CAPILLARY     Status: Abnormal   Collection Time   05/09/12  5:34 PM      Component Value Range Comment   Glucose-Capillary 174 (*) 70 - 99 mg/dL    Comment 1 Notify RN     GLUCOSE, CAPILLARY     Status: Abnormal   Collection Time   05/09/12  7:59 PM      Component Value Range Comment   Glucose-Capillary 114 (*) 70 - 99 mg/dL   GLUCOSE, CAPILLARY     Status: Normal   Collection Time   05/10/12 12:02 AM      Component Value Range Comment   Glucose-Capillary 74  70 - 99 mg/dL   GLUCOSE, CAPILLARY     Status: Normal   Collection Time   05/10/12  3:49 AM      Component Value Range Comment   Glucose-Capillary 88  70 - 99 mg/dL   BASIC METABOLIC PANEL  Status: Abnormal   Collection Time   05/10/12  5:10 AM      Component Value Range Comment   Sodium 137  135 - 145 mEq/L    Potassium 3.2 (*) 3.5 - 5.1 mEq/L    Chloride 102  96 - 112 mEq/L    CO2 23  19 - 32 mEq/L    Glucose, Bld 84  70 - 99 mg/dL    BUN 13  6 - 23 mg/dL    Creatinine, Ser 8.41  0.50 - 1.10 mg/dL    Calcium 8.7  8.4 - 32.4 mg/dL    GFR calc non Af Amer >90  >90 mL/min    GFR calc Af  Amer >90  >90 mL/min   GLUCOSE, CAPILLARY     Status: Normal   Collection Time   05/10/12  7:43 AM      Component Value Range Comment   Glucose-Capillary 89  70 - 99 mg/dL    Comment 1 Notify RN        HPI :  36 year old diabetic female admitted for nausea vomiting ongoing on intermittent basis for 2 months, had some GI workup done at [facility], KUB showed large amount of stool was given enema with good results, however nausea vomiting did not improve and she continued to be nauseous, GI was consulted who suggested gastric emptying study which the patient was unable to complete because of excessive nausea,  She had following hospital course   1. Persistent Nausea and vomiting for the last 2-3 months, patient reportedly has history of H. pylori diagnosed at [facility], no relief with supportive care, KUB revealed large stool, enema resulted in large amount of stool removal  however patient continues to be nauseated, does have history of diabetes mellitus, unable to tolerate the gastric emptying study on 05/07/2012, she was rx with  PPI, regla, Zofran, erythromycin, liver enzymes and lipase are stable, GI performed EGD that showed patchy gastritis. The reason for the patient's intractable nausea is probably her constipation related to narcotic use. She has been rx with agressive constipation regimen before discharge today. Diet was started and encouraged.GI has no further recommendations.   2. History of diabetes mellitus type 2 and dyslipidemia - NovoLog decreased given the patient oral intake has been poor, she was monitored with CBGs and sliding scale. Diabetes has been in very poor control patient has been counseled, NovoLog is reduced at 30 units twice a day Lab Results   Component  Value  Date    HGBA1C  12.3*  05/05/2012   CBG (last 3)  Basename  05/07/12 0826  05/07/12 0402  05/07/12   GLUCAP  168*  127*  120*    3. hypokalemia required repletion, given poor oral intake. She  will need a repeat BMP in one week    4. Hypertension she will be continued on lisinopril 10 mg by mouth daily  5. Hypokalemia repleted check BMP in one week       Discharge Exam:  Blood pressure 105/67, pulse 89, temperature 97.7 F (36.5 C), temperature source Oral, resp. rate 16, height 5\' 4"  (1.626 m), weight 102.513 kg (226 lb), last menstrual period 02/15/2003, SpO2 99.00%.  HENT:  Head: Normocephalic and atraumatic.  Eyes: Conjunctivae normal and EOM are normal. Pupils are equal, round, and reactive to light.  Neck: Normal range of motion. Neck supple.  Cardiovascular: Normal rate and regular rhythm.  Respiratory: Effort normal and breath sounds normal.  GI: Bowel sounds are normal. She  exhibits no distension and no mass. There is tenderness. There is guarding. There is no rebound.  Morbidly obese, diffusely tender with gaurding but without rebound or rigidity  Musculoskeletal: Normal range of motion.  Neurological: She is alert and oriented to person, place, and time        Discharge Orders    Future Appointments: Provider: Department: Dept Phone: Center:   05/22/2012 10:30 AM Etta Grandchild, MD [facility] [phone] [facility]      Follow-up Information    Follow up with DEFAULT,PROVIDER, MD. Schedule an appointment as soon as possible for a visit in 1 week.   Contact information:   [address] [place] [address] [phone]       Follow up with MANN,JYOTHI, MD. Schedule an appointment as soon as possible for a visit in 2 weeks.   Contact information:   [address] [place] [address] [phone]          Signed: Richarda Overlie 05/10/2012, 9:01 AM

## 2012-05-10 NOTE — Progress Notes (Signed)
Asymptomatic with low CBG's. Refuses po d/t nausea.

## 2012-05-10 NOTE — Progress Notes (Signed)
Subjective: Since I last evaluated the patient, she has not vomited. She is consuming a disproportionate amount of narcotics with no pathology on EGD and CT to explain her pain. She has been constipated. Got some Miralax and Colace with minimal results.   Objective: Vital signs in last 24 hours: Temp:  [97.7 F (36.5 C)-98.6 F (37 C)] 97.7 F (36.5 C) (10/27 0601) Pulse Rate:  [89-98] 89  (10/27 0601) Resp:  [14-18] 16  (10/27 0601) BP: (105-153)/(64-99) 105/67 mmHg (10/27 0601) SpO2:  [96 %-100 %] 99 % (10/27 0601) Weight:  [102.513 kg (226 lb)] 102.513 kg (226 lb) (10/26 1624) Last BM Date: 05/05/12  Intake/Output from previous day: 10/26 0701 - 10/27 0700 In: 858.8 [I.V.:858.8] Out: 500 [Urine:500] Intake/Output this shift:   General appearance: alert, cooperative, appears older than stated age and morbidly obese Resp: clear to auscultation bilaterally Cardio: regular rate and rhythm, S1, S2 normal, no murmur, click, rub or gallop GI: soft, diffusely tender on palpation with hypoactive bowel sounds; no masses,  no organomegaly Extremities: extremities normal, atraumatic, no cyanosis or edema  Lab Results: No results found for this basename: WBC:3,HGB:3,HCT:3,PLT:3 in the last 72 hours BMET  Basename 05/10/12 0510 05/09/12 0910 05/08/12 0625  NA 137 134* 136  K 3.2* 3.1* 2.8*  CL 102 100 101  CO2 23 24 24   GLUCOSE 84 125* 136*  BUN 13 10 9   CREATININE 0.62 0.49* 0.46*  CALCIUM 8.7 9.0 9.1   Studies/Results: No results found.  Medications: I have reviewed the patient's current medications.  Assessment/Plan: 1) Nausea and vomiting ??possible diabetic gastroparesis: she needs to be discharged on a prokinetic. She should follow up with her endocrinologist in [facility].  2) Morbid obesity.  3) NON-COMPLIANCE.   LOS: 5 days   Olivia Pavelko 05/10/2012, 10:33 AM

## 2012-05-11 ENCOUNTER — Encounter (HOSPITAL_COMMUNITY): Payer: Self-pay | Admitting: Gastroenterology

## 2012-05-17 ENCOUNTER — Encounter (HOSPITAL_COMMUNITY): Payer: Self-pay

## 2012-05-17 ENCOUNTER — Emergency Department (HOSPITAL_COMMUNITY)
Admission: EM | Admit: 2012-05-17 | Discharge: 2012-05-17 | Disposition: A | Discharge status: Home / Self Care | Payer: Medicaid Other | Attending: Emergency Medicine | Admitting: Emergency Medicine

## 2012-05-17 ENCOUNTER — Emergency Department (HOSPITAL_COMMUNITY): Payer: Medicaid Other

## 2012-05-17 DIAGNOSIS — Z87448 Personal history of other diseases of urinary system: Secondary | ICD-10-CM | POA: Insufficient documentation

## 2012-05-17 DIAGNOSIS — R1110 Vomiting, unspecified: Secondary | ICD-10-CM | POA: Insufficient documentation

## 2012-05-17 DIAGNOSIS — R1084 Generalized abdominal pain: Secondary | ICD-10-CM | POA: Insufficient documentation

## 2012-05-17 DIAGNOSIS — J45909 Unspecified asthma, uncomplicated: Secondary | ICD-10-CM | POA: Insufficient documentation

## 2012-05-17 DIAGNOSIS — E1169 Type 2 diabetes mellitus with other specified complication: Secondary | ICD-10-CM | POA: Insufficient documentation

## 2012-05-17 DIAGNOSIS — E785 Hyperlipidemia, unspecified: Secondary | ICD-10-CM | POA: Insufficient documentation

## 2012-05-17 DIAGNOSIS — R739 Hyperglycemia, unspecified: Secondary | ICD-10-CM

## 2012-05-17 DIAGNOSIS — Z794 Long term (current) use of insulin: Secondary | ICD-10-CM | POA: Insufficient documentation

## 2012-05-17 LAB — COMPREHENSIVE METABOLIC PANEL
ALT: 16 U/L (ref 0–35)
AST: 14 U/L (ref 0–37)
Albumin: 3.2 g/dL — ABNORMAL LOW (ref 3.5–5.2)
CO2: 21 mEq/L (ref 19–32)
Chloride: 97 mEq/L (ref 96–112)
Creatinine, Ser: 0.37 mg/dL — ABNORMAL LOW (ref 0.50–1.10)
Potassium: 3.6 mEq/L (ref 3.5–5.1)
Sodium: 132 mEq/L — ABNORMAL LOW (ref 135–145)
Total Bilirubin: 0.3 mg/dL (ref 0.3–1.2)

## 2012-05-17 LAB — CBC WITH DIFFERENTIAL/PLATELET
Basophils Absolute: 0 10*3/uL (ref 0.0–0.1)
Basophils Relative: 0 % (ref 0–1)
Lymphocytes Relative: 24 % (ref 12–46)
MCHC: 36.4 g/dL — ABNORMAL HIGH (ref 30.0–36.0)
Monocytes Absolute: 0.4 10*3/uL (ref 0.1–1.0)
Neutro Abs: 6.2 10*3/uL (ref 1.7–7.7)
Neutrophils Relative %: 71 % (ref 43–77)
Platelets: 304 10*3/uL (ref 150–400)
RDW: 12.1 % (ref 11.5–15.5)
WBC: 8.7 10*3/uL (ref 4.0–10.5)

## 2012-05-17 LAB — GLUCOSE, CAPILLARY: Glucose-Capillary: 240 mg/dL — ABNORMAL HIGH (ref 70–99)

## 2012-05-17 LAB — POCT I-STAT 3, VENOUS BLOOD GAS (G3P V)
Acid-Base Excess: 2 mmol/L (ref 0.0–2.0)
Bicarbonate: 22.3 mEq/L (ref 20.0–24.0)
TCO2: 23 mmol/L (ref 0–100)
pO2, Ven: 60 mmHg — ABNORMAL HIGH (ref 30.0–45.0)

## 2012-05-17 LAB — KETONES, QUALITATIVE: Acetone, Bld: NEGATIVE

## 2012-05-17 MED ORDER — METOCLOPRAMIDE HCL 5 MG/ML IJ SOLN
10.0000 mg | Freq: Once | INTRAMUSCULAR | Status: AC
  Administered 2012-05-17: 10 mg via INTRAVENOUS
  Filled 2012-05-17: qty 2

## 2012-05-17 MED ORDER — PROMETHAZINE HCL 25 MG/ML IJ SOLN
25.0000 mg | Freq: Once | INTRAMUSCULAR | Status: AC
  Administered 2012-05-17: 25 mg via INTRAVENOUS
  Filled 2012-05-17: qty 1

## 2012-05-17 MED ORDER — HYDROMORPHONE HCL PF 1 MG/ML IJ SOLN
1.0000 mg | Freq: Once | INTRAMUSCULAR | Status: AC
  Administered 2012-05-17: 1 mg via INTRAVENOUS
  Filled 2012-05-17: qty 1

## 2012-05-17 MED ORDER — PROMETHAZINE HCL 25 MG PO TABS: 25.0000 mg | ORAL_TABLET | Freq: Four times a day (QID) | ORAL | Status: DC | PRN

## 2012-05-17 MED ORDER — SODIUM CHLORIDE 0.9 % IV BOLUS (SEPSIS)
1000.0000 mL | Freq: Once | INTRAVENOUS | Status: AC
  Administered 2012-05-17: 1000 mL via INTRAVENOUS

## 2012-05-17 MED ORDER — PROMETHAZINE HCL 25 MG/ML IJ SOLN
25.0000 mg | Freq: Once | INTRAMUSCULAR | Status: AC
  Administered 2012-05-17: 25 mg via INTRAMUSCULAR
  Filled 2012-05-17: qty 1

## 2012-05-17 NOTE — ED Provider Notes (Signed)
History     CSN: [id number]  Arrival date & time 05/17/12  4098   First MD Initiated Contact with Patient 05/17/12 0804      Chief Complaint  Patient presents with  . Emesis    (Consider location/radiation/quality/duration/timing/severity/associated sxs/prior treatment) The history is provided by the patient.  Toshiko Kemler is a 36 y.o. female hx of DM, HL, possible gastroparesis here with abdominal pain and vomiting. She was recently admitted in the hospital and discharged 3 days ago for the same thing. She was given Reglan and erythromycin to go home with but she was not able to fill it. She just got her Medicaid yesterday was unable to get the medication. Since yesterday she has been vomiting profusely and unable to tolerate PO. Denies fevers but she does have some generalized abdominal pain which was chronic. Denies any diarrhea. She hasn't had gastric emptying studies in the past but she has a GI Dr. that she follows up with. She had a HIDA that was done 2 days ago with results pending.   Past Medical History  Diagnosis Date  . Interstitial cystitis   . Asthma   . Diabetes mellitus   . HLD (hyperlipidemia)     Past Surgical History  Procedure Date  . Abdominal hysterectomy   . Esophagogastroduodenoscopy 05/09/2012    Procedure: ESOPHAGOGASTRODUODENOSCOPY (EGD);  Surgeon: Charna Elizabeth, MD;  Location: [facility] ENDOSCOPY;  Service: Endoscopy;  Laterality: N/A;    Family History  Problem Relation Age of Onset  . Heart disease      No known family history    History  Substance Use Topics  . Smoking status: Never Smoker   . Smokeless tobacco: Never Used  . Alcohol Use: No     Comment: rarely    OB History    Grav Para Term Preterm Abortions TAB SAB Ect Mult Living                  Review of Systems  Gastrointestinal: Positive for nausea, vomiting and abdominal pain.  All other systems reviewed and are negative.    Allergies  Kiwi extract  Home Medications    Current Outpatient Rx  Name  Route  Sig  Dispense  Refill  . ALBUTEROL SULFATE HFA 108 (90 BASE) MCG/ACT IN AERS   Inhalation   Inhale 2 puffs into the lungs every 6 (six) hours as needed. Wheezing or shortness of breath         . INSULIN ASPART PROT & ASPART (70-30) 100 UNIT/ML [place] SUSP   Subcutaneous   Inject 80 Units into the skin 2 (two) times daily with a meal.           BP 144/99  Pulse 103  Temp 97.6 F (36.4 C) (Oral)  Resp 18  SpO2 100%  LMP 02/15/2003  Physical Exam  Nursing note and vitals reviewed. Constitutional: She is oriented to person, place, and time. She appears well-developed and well-nourished.       Uncomfortable, vomiting   HENT:  Head: Normocephalic.       MM slightly dry, OP clear   Eyes: Conjunctivae normal are normal. Pupils are equal, round, and reactive to light.  Neck: Normal range of motion. Neck supple.  Cardiovascular: Normal rate, regular rhythm and normal heart sounds.   Pulmonary/Chest: Effort normal and breath sounds normal. No respiratory distress. She has no wheezes. She has no rales.  Abdominal: Soft. Bowel sounds are normal.       Mild diffuse  tenderness, no rebound, worse in epigastric area.   Musculoskeletal: Normal range of motion.  Neurological: She is alert and oriented to person, place, and time.  Skin: Skin is warm and dry.  Psychiatric: She has a normal mood and affect. Her behavior is normal. Judgment and thought content normal.    ED Course  Procedures (including critical care time)  Labs Reviewed  GLUCOSE, CAPILLARY - Abnormal; Notable for the following:    Glucose-Capillary 240 (*)     All other components within normal limits  CBC WITH DIFFERENTIAL - Abnormal; Notable for the following:    RBC 5.28 (*)     Hemoglobin 15.2 (*)     MCHC 36.4 (*)     All other components within normal limits  COMPREHENSIVE METABOLIC PANEL - Abnormal; Notable for the following:    Sodium 132 (*)     Glucose, Bld 267 (*)      Creatinine, Ser 0.37 (*)     Albumin 3.2 (*)     All other components within normal limits  POCT I-STAT 3, BLOOD GAS (G3P V) - Abnormal; Notable for the following:    pH, Ven 7.554 (*)     pCO2, Ven 25.3 (*)     pO2, Ven 60.0 (*)     All other components within normal limits  LIPASE, BLOOD  KETONES, QUALITATIVE  URINALYSIS, ROUTINE W REFLEX MICROSCOPIC   Dg Abd Acute W/chest  05/17/2012  *RADIOLOGY REPORT*  Clinical Data: Nausea and vomiting.  ACUTE ABDOMEN SERIES (ABDOMEN 2 VIEW & CHEST 1 VIEW)  Comparison: 05/05/2012.  Findings: Lung volumes are normal.  No consolidative airspace disease.  No pleural effusions.  No pneumothorax.  No pulmonary nodule or mass noted.  Pulmonary vasculature and the cardiomediastinal silhouette are within normal limits.  Gas and stool are seen scattered throughout the colon extending to the level of the distal rectum.  No pathologic distension of small bowel is noted.  No gross evidence of pneumoperitoneum. Postoperative changes are again seen projecting over the lower right hemi pelvis.  IMPRESSION: 1.  Nonobstructive bowel gas pattern. 2.  No pneumoperitoneum. 3.  No radiographic evidence of acute cardiopulmonary disease.   Original Report Authenticated By: Trudie Reed, M.D.      1. Vomiting   2. Hyperglycemia       MDM  Haidy Kackley is a 36 y.o. female hx of DM here with ab pain, vomiting. She likely has gastroparesis given the chronicity of her symptoms. CBG 240 this AM so will consider early DKA as well. Will get labs, give IVF, antiemetics and reassess.   12:33 PM Patient felt better. Patient is not acidotic and no anion gap. Not vomiting anymore. Received 2L NS bolus. Xray abdomen showed no SBO. Recommend that she fill her reglan, erythromycin, and will prescribed phenergan.         Richardean Canal, MD 05/17/12 [phone]

## 2012-05-17 NOTE — ED Notes (Signed)
Pt knows that urine is needed 

## 2012-05-17 NOTE — ED Notes (Signed)
Paged IV team again for IV start.

## 2012-05-17 NOTE — ED Notes (Signed)
Pt with hx of vomiting over the last few months with visits to this ED.  Pt presents today with vomiting that began around 8 pm last night.  Pt is diabetic and took 80 units of 70/30 insulin last night at dinner.  CBG with [place] this morning is 240.  Pt c/o general abdominal pain.

## 2012-05-17 NOTE — ED Notes (Signed)
Paged IV team for IV start.

## 2012-05-18 ENCOUNTER — Encounter (HOSPITAL_COMMUNITY): Payer: Self-pay | Admitting: Emergency Medicine

## 2012-05-18 DIAGNOSIS — R109 Unspecified abdominal pain: Secondary | ICD-10-CM | POA: Insufficient documentation

## 2012-05-18 DIAGNOSIS — Z8719 Personal history of other diseases of the digestive system: Secondary | ICD-10-CM | POA: Insufficient documentation

## 2012-05-18 DIAGNOSIS — Z9889 Other specified postprocedural states: Secondary | ICD-10-CM | POA: Insufficient documentation

## 2012-05-18 DIAGNOSIS — R112 Nausea with vomiting, unspecified: Secondary | ICD-10-CM | POA: Insufficient documentation

## 2012-05-18 DIAGNOSIS — Z90710 Acquired absence of both cervix and uterus: Secondary | ICD-10-CM | POA: Insufficient documentation

## 2012-05-18 DIAGNOSIS — E785 Hyperlipidemia, unspecified: Secondary | ICD-10-CM | POA: Insufficient documentation

## 2012-05-18 DIAGNOSIS — J45909 Unspecified asthma, uncomplicated: Secondary | ICD-10-CM | POA: Insufficient documentation

## 2012-05-18 DIAGNOSIS — Z79899 Other long term (current) drug therapy: Secondary | ICD-10-CM | POA: Insufficient documentation

## 2012-05-18 DIAGNOSIS — Z794 Long term (current) use of insulin: Secondary | ICD-10-CM | POA: Insufficient documentation

## 2012-05-18 DIAGNOSIS — E119 Type 2 diabetes mellitus without complications: Secondary | ICD-10-CM | POA: Insufficient documentation

## 2012-05-18 MED ORDER — ONDANSETRON 4 MG PO TBDP
8.0000 mg | ORAL_TABLET | Freq: Once | ORAL | Status: AC
  Administered 2012-05-18: 8 mg via ORAL
  Filled 2012-05-18: qty 2

## 2012-05-18 NOTE — ED Notes (Signed)
Pt reports vomiting since Friday, denies diarrhea, does report generalized abd pain; noticed blood in vomit

## 2012-05-19 ENCOUNTER — Emergency Department (HOSPITAL_COMMUNITY)
Admission: EM | Admit: 2012-05-19 | Discharge: 2012-05-19 | Disposition: A | Discharge status: Home / Self Care | Payer: Medicaid Other | Attending: Emergency Medicine | Admitting: Emergency Medicine

## 2012-05-19 DIAGNOSIS — R112 Nausea with vomiting, unspecified: Secondary | ICD-10-CM

## 2012-05-19 DIAGNOSIS — R109 Unspecified abdominal pain: Secondary | ICD-10-CM

## 2012-05-19 LAB — CBC WITH DIFFERENTIAL/PLATELET
Basophils Absolute: 0 10*3/uL (ref 0.0–0.1)
Eosinophils Absolute: 0 10*3/uL (ref 0.0–0.7)
Eosinophils Relative: 0 % (ref 0–5)
HCT: 43.4 % (ref 36.0–46.0)
Lymphocytes Relative: 23 % (ref 12–46)
MCH: 28.3 pg (ref 26.0–34.0)
MCV: 80.2 fL (ref 78.0–100.0)
Monocytes Absolute: 0.4 10*3/uL (ref 0.1–1.0)
RDW: 12.5 % (ref 11.5–15.5)
WBC: 9.6 10*3/uL (ref 4.0–10.5)

## 2012-05-19 LAB — URINALYSIS, MICROSCOPIC ONLY
Leukocytes, UA: NEGATIVE
Nitrite: NEGATIVE
Specific Gravity, Urine: 1.017 (ref 1.005–1.030)
Urobilinogen, UA: 1 mg/dL (ref 0.0–1.0)

## 2012-05-19 LAB — COMPREHENSIVE METABOLIC PANEL
AST: 23 U/L (ref 0–37)
CO2: 20 mEq/L (ref 19–32)
Calcium: 9.5 mg/dL (ref 8.4–10.5)
Creatinine, Ser: 0.42 mg/dL — ABNORMAL LOW (ref 0.50–1.10)
GFR calc Af Amer: >90 mL/min (ref >90)
GFR calc non Af Amer: >90 mL/min (ref >90)
Glucose, Bld: 155 mg/dL — ABNORMAL HIGH (ref 70–99)
Total Protein: 7.7 g/dL (ref 6.0–8.3)

## 2012-05-19 MED ORDER — ONDANSETRON 4 MG PO TBDP: 4.0000 mg | ORAL_TABLET | Freq: Three times a day (TID) | ORAL | Status: DC | PRN

## 2012-05-19 MED ORDER — ONDANSETRON HCL 4 MG/2ML IJ SOLN
4.0000 mg | Freq: Once | INTRAMUSCULAR | Status: AC
  Administered 2012-05-19: 4 mg via INTRAVENOUS
  Filled 2012-05-19: qty 2

## 2012-05-19 MED ORDER — ONDANSETRON 4 MG PO TBDP
4.0000 mg | ORAL_TABLET | Freq: Once | ORAL | Status: AC
  Administered 2012-05-19: 4 mg via ORAL
  Filled 2012-05-19: qty 1

## 2012-05-19 MED ORDER — PROMETHAZINE HCL 25 MG/ML IJ SOLN
25.0000 mg | Freq: Once | INTRAMUSCULAR | Status: AC
  Administered 2012-05-19: 25 mg via INTRAVENOUS
  Filled 2012-05-19: qty 1

## 2012-05-19 MED ORDER — PROMETHAZINE HCL 25 MG RE SUPP: 25.0000 mg | Freq: Four times a day (QID) | RECTAL | Status: DC | PRN

## 2012-05-19 MED ORDER — METOCLOPRAMIDE HCL 5 MG/ML IJ SOLN
10.0000 mg | Freq: Once | INTRAMUSCULAR | Status: AC
  Administered 2012-05-19: 10 mg via INTRAVENOUS
  Filled 2012-05-19: qty 2

## 2012-05-19 MED ORDER — HYDROMORPHONE HCL PF 1 MG/ML IJ SOLN
0.5000 mg | Freq: Once | INTRAMUSCULAR | Status: AC
  Administered 2012-05-19: 0.5 mg via INTRAVENOUS
  Filled 2012-05-19: qty 1

## 2012-05-19 MED ORDER — SODIUM CHLORIDE 0.9 % IV BOLUS (SEPSIS)
1000.0000 mL | Freq: Once | INTRAVENOUS | Status: AC
  Administered 2012-05-19: 1000 mL via INTRAVENOUS

## 2012-05-19 NOTE — ED Provider Notes (Signed)
History     CSN: [id number]  Arrival date & time 05/18/12  2309   First MD Initiated Contact with Patient 05/19/12 0145      Chief Complaint  Patient presents with  . Emesis    (Consider location/radiation/quality/duration/timing/severity/associated sxs/prior treatment) HPI Comments: Pt returns with recurrent n/v and abd pain - states that she has tried phenergan, reglan and erythromycin as it is thought that she has gastroparesis but has had no improvement.  She vomited the medicines.  Sx came back at 3 PM yesterday and have been ongoing X 11 hours, nothing makes better or worse.  Has no fevers, cough, back pain, dysuria, rash, swelling or ha.  She does have constipation and has not had BM in 1 week.  CT and [place] from 9 2012 showed that she had stoold burden, no other acute findings.    Patient is a 36 y.o. female presenting with vomiting. The history is provided by the patient, medical records and a friend.  Emesis     Past Medical History  Diagnosis Date  . Interstitial cystitis   . Asthma   . Diabetes mellitus   . HLD (hyperlipidemia)     Past Surgical History  Procedure Date  . Abdominal hysterectomy   . Esophagogastroduodenoscopy 05/09/2012    Procedure: ESOPHAGOGASTRODUODENOSCOPY (EGD);  Surgeon: Charna Elizabeth, MD;  Location: [facility] ENDOSCOPY;  Service: Endoscopy;  Laterality: N/A;    Family History  Problem Relation Age of Onset  . Heart disease      No known family history    History  Substance Use Topics  . Smoking status: Never Smoker   . Smokeless tobacco: Never Used  . Alcohol Use: No     Comment: rarely    OB History    Grav Para Term Preterm Abortions TAB SAB Ect Mult Living                  Review of Systems  Gastrointestinal: Positive for vomiting.  All other systems reviewed and are negative.    Allergies  Kiwi extract  Home Medications   Current Outpatient Rx  Name  Route  Sig  Dispense  Refill  . ALBUTEROL SULFATE HFA 108 (90 BASE)  MCG/ACT IN AERS   Inhalation   Inhale 2 puffs into the lungs every 6 (six) hours as needed. Wheezing or shortness of breath         . INSULIN ASPART PROT & ASPART (70-30) 100 UNIT/ML [place] SUSP   Subcutaneous   Inject 80 Units into the skin 2 (two) times daily with a meal.         . PROMETHAZINE HCL 25 MG PO TABS   Oral   Take 1 tablet (25 mg total) by mouth every 6 (six) hours as needed for nausea.   15 tablet   0   . ONDANSETRON 4 MG PO TBDP   Oral   Take 1 tablet (4 mg total) by mouth every 8 (eight) hours as needed for nausea.   10 tablet   0   . PROMETHAZINE HCL 25 MG RE SUPP   Rectal   Place 1 suppository (25 mg total) rectally every 6 (six) hours as needed for nausea.   12 each   0     BP 156/88  Pulse 106  Temp 98.5 F (36.9 C) (Oral)  Resp 16  SpO2 99%  LMP 02/15/2003  Physical Exam  Nursing note and vitals reviewed. Constitutional: She appears well-developed and well-nourished. No distress.  HENT:  Head: Normocephalic and atraumatic.  Mouth/Throat: Oropharynx is clear and moist. No oropharyngeal exudate.  Eyes: Conjunctivae normal and EOM are normal. Pupils are equal, round, and reactive to light. Right eye exhibits no discharge. Left eye exhibits no discharge. No scleral icterus.  Neck: Normal range of motion. Neck supple. No JVD present. No thyromegaly present.  Cardiovascular: Normal rate, regular rhythm, normal heart sounds and intact distal pulses.  Exam reveals no gallop and no friction rub.   No murmur heard. Pulmonary/Chest: Effort normal and breath sounds normal. No respiratory distress. She has no wheezes. She has no rales.  Abdominal: Soft. Bowel sounds are normal. She exhibits no distension and no mass. There is tenderness ( mild upper abd ttp, no rebound, no guarding, no masses).  Musculoskeletal: Normal range of motion. She exhibits no edema and no tenderness.  Lymphadenopathy:    She has no cervical adenopathy.  Neurological: She is alert.  Coordination normal.  Skin: Skin is warm and dry. No rash noted. No erythema.  Psychiatric: She has a normal mood and affect. Her behavior is normal.    ED Course  Procedures (including critical care time)  Labs Reviewed  CBC WITH DIFFERENTIAL - Abnormal; Notable for the following:    RBC 5.41 (*)     Hemoglobin 15.3 (*)     All other components within normal limits  COMPREHENSIVE METABOLIC PANEL - Abnormal; Notable for the following:    Potassium 3.4 (*)     Glucose, Bld 155 (*)     Creatinine, Ser 0.42 (*)     Albumin 3.3 (*)     All other components within normal limits  URINALYSIS, MICROSCOPIC ONLY - Abnormal; Notable for the following:    APPearance CLOUDY (*)     pH 8.5 (*)     Ketones, ur 15 (*)     Bacteria, UA FEW (*)     Squamous Epithelial / LPF FEW (*)     All other components within normal limits  GLUCOSE, CAPILLARY - Abnormal; Notable for the following:    Glucose-Capillary 145 (*)     All other components within normal limits  POCT PREGNANCY, URINE   Dg Abd Acute W/chest  05/17/2012  *RADIOLOGY REPORT*  Clinical Data: Nausea and vomiting.  ACUTE ABDOMEN SERIES (ABDOMEN 2 VIEW & CHEST 1 VIEW)  Comparison: 05/05/2012.  Findings: Lung volumes are normal.  No consolidative airspace disease.  No pleural effusions.  No pneumothorax.  No pulmonary nodule or mass noted.  Pulmonary vasculature and the cardiomediastinal silhouette are within normal limits.  Gas and stool are seen scattered throughout the colon extending to the level of the distal rectum.  No pathologic distension of small bowel is noted.  No gross evidence of pneumoperitoneum. Postoperative changes are again seen projecting over the lower right hemi pelvis.  IMPRESSION: 1.  Nonobstructive bowel gas pattern. 2.  No pneumoperitoneum. 3.  No radiographic evidence of acute cardiopulmonary disease.   Original Report Authenticated By: Trudie Reed, M.D.      1. Nausea and vomiting   2. Abdominal pain        MDM  Dehdyrated, normal labs, no leukocytosis, and no renal dysfunction, check kub to eval for stool burden, check UA and antiemetics with fluids ordered  Pt has improved, no vomiting since arrival, sleeping restfully after meds, d/c pt and familyi re: going home - amenable to d/c with rectal phenergan and ODT zofran.  Labs show no abnormlaities, normal renal function.  Vida Roller, MD 05/19/12 [phone]

## 2012-05-19 NOTE — ED Notes (Signed)
Pt actively vomiting. Vomit appears brown and blood tinged. Pt informed about need for urine sample.

## 2012-05-22 ENCOUNTER — Ambulatory Visit: Payer: 59 | Admitting: Internal Medicine

## 2012-05-22 DIAGNOSIS — Z0289 Encounter for other administrative examinations: Secondary | ICD-10-CM

## 2012-06-04 ENCOUNTER — Emergency Department (HOSPITAL_COMMUNITY): Payer: Medicaid Other

## 2012-06-04 ENCOUNTER — Inpatient Hospital Stay (HOSPITAL_COMMUNITY)
Admission: EM | Admit: 2012-06-04 | Discharge: 2012-06-08 | DRG: 074 | Disposition: A | Discharge status: Other Acute Inpatient Hospital | Payer: Medicaid Other | Attending: Internal Medicine | Admitting: Internal Medicine

## 2012-06-04 ENCOUNTER — Encounter (HOSPITAL_COMMUNITY): Payer: Self-pay | Admitting: Physical Medicine and Rehabilitation

## 2012-06-04 DIAGNOSIS — E872 Acidosis, unspecified: Secondary | ICD-10-CM | POA: Diagnosis present

## 2012-06-04 DIAGNOSIS — F3289 Other specified depressive episodes: Secondary | ICD-10-CM | POA: Diagnosis present

## 2012-06-04 DIAGNOSIS — Z79899 Other long term (current) drug therapy: Secondary | ICD-10-CM

## 2012-06-04 DIAGNOSIS — F329 Major depressive disorder, single episode, unspecified: Secondary | ICD-10-CM | POA: Diagnosis present

## 2012-06-04 DIAGNOSIS — K3184 Gastroparesis: Secondary | ICD-10-CM

## 2012-06-04 DIAGNOSIS — E785 Hyperlipidemia, unspecified: Secondary | ICD-10-CM | POA: Diagnosis present

## 2012-06-04 DIAGNOSIS — E1149 Type 2 diabetes mellitus with other diabetic neurological complication: Principal | ICD-10-CM | POA: Diagnosis present

## 2012-06-04 DIAGNOSIS — Z794 Long term (current) use of insulin: Secondary | ICD-10-CM

## 2012-06-04 DIAGNOSIS — E119 Type 2 diabetes mellitus without complications: Secondary | ICD-10-CM

## 2012-06-04 DIAGNOSIS — J45909 Unspecified asthma, uncomplicated: Secondary | ICD-10-CM | POA: Diagnosis present

## 2012-06-04 DIAGNOSIS — R109 Unspecified abdominal pain: Secondary | ICD-10-CM

## 2012-06-04 DIAGNOSIS — E1143 Type 2 diabetes mellitus with diabetic autonomic (poly)neuropathy: Secondary | ICD-10-CM | POA: Diagnosis present

## 2012-06-04 DIAGNOSIS — R112 Nausea with vomiting, unspecified: Secondary | ICD-10-CM

## 2012-06-04 HISTORY — DX: Dyspnea, unspecified: R06.00

## 2012-06-04 HISTORY — DX: Other forms of dyspnea: R06.09

## 2012-06-04 HISTORY — DX: Gastritis, unspecified, without bleeding: K29.70

## 2012-06-04 HISTORY — DX: Type 2 diabetes mellitus without complications: E11.9

## 2012-06-04 HISTORY — DX: Gastro-esophageal reflux disease without esophagitis: K21.9

## 2012-06-04 HISTORY — DX: Headache: R51

## 2012-06-04 HISTORY — DX: Migraine, unspecified, not intractable, without status migrainosus: G43.909

## 2012-06-04 HISTORY — DX: Anxiety disorder, unspecified: F41.9

## 2012-06-04 HISTORY — DX: Personal history of other medical treatment: Z92.89

## 2012-06-04 HISTORY — DX: Headache, unspecified: R51.9

## 2012-06-04 LAB — COMPREHENSIVE METABOLIC PANEL
ALT: 18 U/L (ref 0–35)
CO2: 18 mEq/L — ABNORMAL LOW (ref 19–32)
Calcium: 10.7 mg/dL — ABNORMAL HIGH (ref 8.4–10.5)
GFR calc Af Amer: >90 mL/min (ref >90)
GFR calc non Af Amer: >90 mL/min (ref >90)
Glucose, Bld: 366 mg/dL — ABNORMAL HIGH (ref 70–99)
Sodium: 130 mEq/L — ABNORMAL LOW (ref 135–145)

## 2012-06-04 LAB — CBC WITH DIFFERENTIAL/PLATELET
Eosinophils Relative: 0 % (ref 0–5)
HCT: 40.5 % (ref 36.0–46.0)
Hemoglobin: 14.4 g/dL (ref 12.0–15.0)
Lymphocytes Relative: 18 % (ref 12–46)
Lymphs Abs: 1.9 10*3/uL (ref 0.7–4.0)
MCV: 77.3 fL — ABNORMAL LOW (ref 78.0–100.0)
Monocytes Absolute: 0.9 10*3/uL (ref 0.1–1.0)
Platelets: 439 10*3/uL — ABNORMAL HIGH (ref 150–400)
RBC: 5.24 MIL/uL — ABNORMAL HIGH (ref 3.87–5.11)
WBC: 10.6 10*3/uL — ABNORMAL HIGH (ref 4.0–10.5)

## 2012-06-04 MED ORDER — DIPHENHYDRAMINE HCL 50 MG/ML IJ SOLN
12.5000 mg | Freq: Once | INTRAMUSCULAR | Status: AC
  Administered 2012-06-04: 12.5 mg via INTRAVENOUS
  Filled 2012-06-04: qty 1

## 2012-06-04 MED ORDER — PROMETHAZINE HCL 25 MG/ML IJ SOLN
25.0000 mg | INTRAMUSCULAR | Status: DC | PRN
  Administered 2012-06-05: 25 mg via INTRAVENOUS
  Filled 2012-06-04: qty 1

## 2012-06-04 MED ORDER — METOCLOPRAMIDE HCL 5 MG/ML IJ SOLN: 10.0000 mg | Freq: Four times a day (QID) | INTRAMUSCULAR | Status: DC

## 2012-06-04 MED ORDER — METOCLOPRAMIDE HCL 5 MG/ML IJ SOLN: 10.0000 mg | Freq: Three times a day (TID) | INTRAMUSCULAR | Status: DC

## 2012-06-04 MED ORDER — PROMETHAZINE HCL 25 MG/ML IJ SOLN
25.0000 mg | Freq: Once | INTRAMUSCULAR | Status: AC
  Administered 2012-06-04: 25 mg via INTRAVENOUS
  Filled 2012-06-04: qty 1

## 2012-06-04 MED ORDER — DIPHENHYDRAMINE HCL 50 MG/ML IJ SOLN: 12.5000 mg | Freq: Once | INTRAMUSCULAR | Status: DC

## 2012-06-04 MED ORDER — ONDANSETRON HCL 4 MG/2ML IJ SOLN
4.0000 mg | Freq: Four times a day (QID) | INTRAMUSCULAR | Status: DC | PRN
  Administered 2012-06-05 – 2012-06-08 (×3): 4 mg via INTRAVENOUS
  Filled 2012-06-04 (×4): qty 2

## 2012-06-04 MED ORDER — DIPHENHYDRAMINE HCL 50 MG/ML IJ SOLN: 12.5000 mg | Freq: Four times a day (QID) | INTRAMUSCULAR | Status: DC | PRN

## 2012-06-04 MED ORDER — LACTATED RINGERS IV BOLUS (SEPSIS)
1000.0000 mL | Freq: Once | INTRAVENOUS | Status: AC
  Administered 2012-06-04: 1000 mL via INTRAVENOUS

## 2012-06-04 MED ORDER — LACTATED RINGERS IV SOLN
INTRAVENOUS | Status: DC
  Administered 2012-06-04 – 2012-06-05 (×4): via INTRAVENOUS
  Administered 2012-06-05: 1000 mL via INTRAVENOUS

## 2012-06-04 MED ORDER — METOCLOPRAMIDE HCL 5 MG/ML IJ SOLN
10.0000 mg | Freq: Once | INTRAMUSCULAR | Status: AC
  Administered 2012-06-04: 10 mg via INTRAVENOUS
  Filled 2012-06-04: qty 2

## 2012-06-04 MED ORDER — LORAZEPAM 2 MG/ML IJ SOLN
1.0000 mg | Freq: Once | INTRAMUSCULAR | Status: AC
  Administered 2012-06-04: 1 mg via INTRAVENOUS
  Filled 2012-06-04: qty 1

## 2012-06-04 MED ORDER — METOCLOPRAMIDE HCL 5 MG/ML IJ SOLN
10.0000 mg | Freq: Four times a day (QID) | INTRAMUSCULAR | Status: DC
  Administered 2012-06-04 – 2012-06-08 (×16): 10 mg via INTRAVENOUS
  Filled 2012-06-04 (×18): qty 2

## 2012-06-04 MED ORDER — HYDROMORPHONE HCL PF 1 MG/ML IJ SOLN
1.0000 mg | INTRAMUSCULAR | Status: AC
  Administered 2012-06-04: 1 mg via INTRAVENOUS
  Filled 2012-06-04: qty 1

## 2012-06-04 NOTE — ED Notes (Signed)
Pt staes she was just released from [facility] yesterday with n/v states had a pic line there there and they took it out before she left states was still vomitingwhen she left pt aaox 3

## 2012-06-04 NOTE — ED Notes (Signed)
Pt is aware of the urine sample needed, urine cup is at the bedside

## 2012-06-04 NOTE — ED Notes (Addendum)
Pt presents to department for evaluation of diffuse abdominal pain and nausea/vomiting. Ongoing for several months. Pt was recently discharged from [facility]. 10/10 pain upon arrival. Denies fever. She is alert and oriented x4. Denies urinary symptoms.

## 2012-06-04 NOTE — ED Provider Notes (Signed)
History     CSN: [id number]  Arrival date & time 06/04/12  1152   First MD Initiated Contact with Patient 06/04/12 1251      Chief Complaint  Patient presents with  . Abdominal Pain  . Nausea    (Consider location/radiation/quality/duration/timing/severity/associated sxs/prior treatment) HPI Marissa Barrett is a 36 y.o. female history of interstitial cystitis, diabetes mellitus and symptoms consistent with gastroparesis however no laboratory data is notable to demonstrate this and she was not able to keep down the contrast medium presents to the ER with nausea vomiting and 10 out of 10 stabbing pain to the top of the abdomen. This is no change from her chronic abdominal pain. She is actually just discharged from [facility] yesterday she continued to have symptoms while in the hospital and he continued to discharge her. Patient has been on and unable to keep down her medicines at home and this presents to the ER with pain and vomiting. No fevers, no chills, diarrhea, no bloody stools, no bilious vomiting, no bloody vomiting. No chest pain, shortness of breath.  She also complains constipation, no BM in about 5.  Past Medical History  Diagnosis Date  . Interstitial cystitis   . Asthma   . Diabetes mellitus   . HLD (hyperlipidemia)     Past Surgical History  Procedure Date  . Abdominal hysterectomy   . Esophagogastroduodenoscopy 05/09/2012    Procedure: ESOPHAGOGASTRODUODENOSCOPY (EGD);  Surgeon: Charna Elizabeth, MD;  Location: [facility] ENDOSCOPY;  Service: Endoscopy;  Laterality: N/A;    Family History  Problem Relation Age of Onset  . Heart disease      No known family history    History  Substance Use Topics  . Smoking status: Never Smoker   . Smokeless tobacco: Never Used  . Alcohol Use: No     Comment: rarely    OB History    Grav Para Term Preterm Abortions TAB SAB Ect Mult Living                  Review of Systems At least 10pt or greater review of systems  completed and are negative except where specified in the HPI.  Allergies  Kiwi extract  Home Medications   Current Outpatient Rx  Name  Route  Sig  Dispense  Refill  . ALBUTEROL SULFATE HFA 108 (90 BASE) MCG/ACT IN AERS   Inhalation   Inhale 2 puffs into the lungs every 6 (six) hours as needed. Wheezing or shortness of breath         . CITALOPRAM HYDROBROMIDE 20 MG PO TABS   Oral   Take 20 mg by mouth daily.         [name] Kitchen DICYCLOMINE HCL 20 MG PO TABS   Oral   Take 20 mg by mouth 3 (three) times daily as needed. For stomach pain         . HYDROCODONE-ACETAMINOPHEN 7.5-325 MG PO TABS   Oral   Take 1 tablet by mouth every 6 (six) hours as needed. For pain         . INSULIN DETEMIR 100 UNIT/ML [place] SOLN   Subcutaneous   Inject 25 Units into the skin at bedtime.         [name] Kitchen LANSOPRAZOLE 30 MG PO CPDR   Oral   Take 30 mg by mouth 2 (two) times daily.         [name] Kitchen METOCLOPRAMIDE HCL 10 MG PO TABS   Oral   Take 10 mg  by mouth every 6 (six) hours as needed. For stomach upset         . PROMETHAZINE HCL 25 MG PO TABS   Oral   Take 1 tablet (25 mg total) by mouth every 6 (six) hours as needed for nausea.   15 tablet   0   . TRAMADOL HCL 50 MG PO TABS   Oral   Take 100 mg by mouth daily as needed. For pain           BP 139/103  Pulse 125  Temp 97.5 F (36.4 C) (Oral)  Resp 20  SpO2 100%  LMP 02/15/2003  Physical Exam  Nursing notes reviewed.  Electronic medical record reviewed. VITAL SIGNS:   Filed Vitals:   06/04/12 1158 06/04/12 1339 06/04/12 1521  BP: 139/103 159/95 135/90  Pulse: 125 104 114  Temp: 97.5 F (36.4 C)    TempSrc: Oral    Resp: 20 17   SpO2: 100% 95% 100%   CONSTITUTIONAL: Awake, oriented, appears non-toxic, patient appears uncomfortable and is vomiting HENT: Atraumatic, normocephalic, oral mucosa pink and moist, airway patent. Nares patent without drainage. External ears normal. EYES: Conjunctiva clear, EOMI, PERRLA NECK: Trachea  midline, non-tender, supple CARDIOVASCULAR: Normal heart rate, Normal rhythm, No murmurs, rubs, gallops PULMONARY/CHEST: Clear to auscultation, no rhonchi, wheezes, or rales. Symmetrical breath sounds. Non-tender. ABDOMINAL: Non-distended, soft, generalized tenderness in the upper quadrants to palpation, no rebound or guarding.  BS normal. NEUROLOGIC: Non-focal, moving all four extremities, no gross sensory or motor deficits. EXTREMITIES: No clubbing, cyanosis, or edema SKIN: Warm, Dry, No erythema, No rash. Patchy areas of depigmentation  ED Course  Procedures (including critical care time)  Labs Reviewed  CBC WITH DIFFERENTIAL - Abnormal; Notable for the following:    WBC 10.6 (*)     RBC 5.24 (*)     MCV 77.3 (*)     Platelets 439 (*)     Neutro Abs 7.8 (*)     All other components within normal limits  COMPREHENSIVE METABOLIC PANEL - Abnormal; Notable for the following:    Sodium 130 (*)     Chloride 92 (*)     CO2 18 (*)     Glucose, Bld 366 (*)     Creatinine, Ser 0.42 (*)     Calcium 10.7 (*)     Total Protein 8.8 (*)     All other components within normal limits  LIPASE, BLOOD - Abnormal; Notable for the following:    Lipase 239 (*)     All other components within normal limits  URINALYSIS, ROUTINE W REFLEX MICROSCOPIC   Dg Abd 2 Views  06/04/2012  *RADIOLOGY REPORT*  Clinical Data: Abdominal pain with vomiting  ABDOMEN - 2 VIEW  Comparison: 05/17/2012  Findings: Normal bowel gas pattern.  No bowel obstruction. Negative for free air.  No bony abnormality.  Electrical lead is present overlying the right sacrum, unchanged.  This does not appear to be connected to a medical device.  No kidney stones.  IMPRESSION: No acute abnormality.   Original Report Authenticated By: Janeece Riggers, M.D.      No diagnosis found.    MDM  Marissa Barrett is a 36 y.o. female history of chronic abdominal pain and chronic nausea and vomiting. Patient was discharged home from the hospital  yesterday. Patient is vomiting in front of me, I do not think that anything new has changed in her physiology or disease process. Been no fevers, no new pain, no  new symptoms. She denies any alcohol or drug use.  Patient's workup shows a mildly elevated lipase at 239, I do not think this is indicative of acute pancreatitis, patient denies any history of pancreatitis, this could be from increased vomiting. She denies any alcohol use.  Patient is still nauseous after 50 mg of Phenergan, will place the patient on hyperemesis CDU protocol, treat her with some Reglan right now and continue her on IV fluids.  If she is able to tolerate by mouth fluids tomorrow morning and she can take her medicine by mouth she may be discharged home otherwise she may require admission for her hyperemesis.  Discussed with CDU         Jones Skene, MD 06/04/12 1703

## 2012-06-04 NOTE — ED Notes (Signed)
Iv team called to start IV and  Get lans

## 2012-06-04 NOTE — ED Notes (Signed)
Patient states she has been having almost constant n/v for the past month. She has been in and [facility]. She has had endoscopies, labs etc and no cause or cure for her nausea has been reached. Patient skin is very dry and ashy. She has visible  "baggyness" to her skin on her arms as if she has had rapid recent weight loss. She states her stomach hurts. Bowel sounds are active. Mucous membranes are pink

## 2012-06-04 NOTE — ED Notes (Signed)
Pt asked if she can provide an urine sample and pt stated she cannot go at this time

## 2012-06-04 NOTE — ED Notes (Signed)
np aware pt requesting something for pain and nausea

## 2012-06-05 ENCOUNTER — Encounter (HOSPITAL_COMMUNITY): Payer: Self-pay | Admitting: Internal Medicine

## 2012-06-05 LAB — URINALYSIS, ROUTINE W REFLEX MICROSCOPIC
Bilirubin Urine: NEGATIVE
Hgb urine dipstick: NEGATIVE
Ketones, ur: 15 mg/dL — AB
Protein, ur: NEGATIVE mg/dL
Urobilinogen, UA: 1 mg/dL (ref 0.0–1.0)

## 2012-06-05 LAB — GLUCOSE, CAPILLARY: Glucose-Capillary: 280 mg/dL — ABNORMAL HIGH (ref 70–99)

## 2012-06-05 LAB — POCT I-STAT, CHEM 8
BUN: 7 mg/dL (ref 6–23)
Creatinine, Ser: 0.5 mg/dL (ref 0.50–1.10)
Hemoglobin: 15.3 g/dL — ABNORMAL HIGH (ref 12.0–15.0)
Potassium: 3.4 mEq/L — ABNORMAL LOW (ref 3.5–5.1)
Sodium: 135 mEq/L (ref 135–145)

## 2012-06-05 LAB — URINE MICROSCOPIC-ADD ON

## 2012-06-05 LAB — T4, FREE: Free T4: 0.89 ng/dL (ref 0.80–1.80)

## 2012-06-05 LAB — CBC
HCT: 42.1 % (ref 36.0–46.0)
MCHC: 35.9 g/dL (ref 30.0–36.0)
MCV: 78.7 fL (ref 78.0–100.0)
RDW: 12 % (ref 11.5–15.5)

## 2012-06-05 LAB — LIPASE, BLOOD: Lipase: 244 U/L — ABNORMAL HIGH (ref 11–59)

## 2012-06-05 LAB — BASIC METABOLIC PANEL
CO2: 25 mEq/L (ref 19–32)
Calcium: 9.1 mg/dL (ref 8.4–10.5)
Chloride: 92 mEq/L — ABNORMAL LOW (ref 96–112)
Potassium: 3.1 mEq/L — ABNORMAL LOW (ref 3.5–5.1)
Sodium: 127 mEq/L — ABNORMAL LOW (ref 135–145)

## 2012-06-05 LAB — POCT PREGNANCY, URINE: Preg Test, Ur: NEGATIVE

## 2012-06-05 MED ORDER — SODIUM CHLORIDE 0.9 % IV SOLN
INTRAVENOUS | Status: DC
  Administered 2012-06-05 – 2012-06-08 (×5): via INTRAVENOUS
  Filled 2012-06-05 (×12): qty 1000

## 2012-06-05 MED ORDER — MORPHINE SULFATE 4 MG/ML IJ SOLN
4.0000 mg | Freq: Once | INTRAMUSCULAR | Status: AC
  Administered 2012-06-05: 4 mg via INTRAVENOUS
  Filled 2012-06-05: qty 1

## 2012-06-05 MED ORDER — LORAZEPAM 2 MG/ML IJ SOLN
1.0000 mg | Freq: Once | INTRAMUSCULAR | Status: AC
  Administered 2012-06-05: 1 mg via INTRAVENOUS
  Filled 2012-06-05: qty 1

## 2012-06-05 MED ORDER — INSULIN ASPART 100 UNIT/ML ~~LOC~~ SOLN
0.0000 [IU] | Freq: Three times a day (TID) | SUBCUTANEOUS | Status: DC
  Administered 2012-06-06: 3 [IU] via SUBCUTANEOUS
  Administered 2012-06-06: 2 [IU] via SUBCUTANEOUS
  Administered 2012-06-06 – 2012-06-07 (×2): 3 [IU] via SUBCUTANEOUS
  Administered 2012-06-07: 2 [IU] via SUBCUTANEOUS
  Administered 2012-06-07: 3 [IU] via SUBCUTANEOUS
  Administered 2012-06-08 (×2): 2 [IU] via SUBCUTANEOUS
  Administered 2012-06-08: 3 [IU] via SUBCUTANEOUS

## 2012-06-05 MED ORDER — INSULIN DETEMIR 100 UNIT/ML ~~LOC~~ SOLN
15.0000 [IU] | Freq: Every day | SUBCUTANEOUS | Status: DC
  Administered 2012-06-05 – 2012-06-07 (×3): 15 [IU] via SUBCUTANEOUS
  Filled 2012-06-05 (×2): qty 10

## 2012-06-05 MED ORDER — CITALOPRAM HYDROBROMIDE 20 MG PO TABS
20.0000 mg | ORAL_TABLET | Freq: Every day | ORAL | Status: DC
  Filled 2012-06-05 (×2): qty 1

## 2012-06-05 MED ORDER — DICYCLOMINE HCL 20 MG PO TABS
20.0000 mg | ORAL_TABLET | Freq: Three times a day (TID) | ORAL | Status: DC
  Filled 2012-06-05 (×5): qty 1

## 2012-06-05 MED ORDER — KETOROLAC TROMETHAMINE 30 MG/ML IJ SOLN
30.0000 mg | Freq: Once | INTRAMUSCULAR | Status: AC
  Administered 2012-06-05: 30 mg via INTRAVENOUS
  Filled 2012-06-05: qty 1

## 2012-06-05 MED ORDER — TRAMADOL HCL 50 MG PO TABS
100.0000 mg | ORAL_TABLET | Freq: Every day | ORAL | Status: DC | PRN
  Administered 2012-06-05 – 2012-06-07 (×4): 100 mg via ORAL
  Filled 2012-06-05 (×5): qty 2

## 2012-06-05 MED ORDER — ALBUTEROL SULFATE HFA 108 (90 BASE) MCG/ACT IN AERS
2.0000 | INHALATION_SPRAY | Freq: Four times a day (QID) | RESPIRATORY_TRACT | Status: DC | PRN
  Filled 2012-06-05: qty 6.7

## 2012-06-05 MED ORDER — ENOXAPARIN SODIUM 40 MG/0.4ML ~~LOC~~ SOLN
40.0000 mg | SUBCUTANEOUS | Status: DC
Start: 2012-06-05 — End: 2012-06-09
  Administered 2012-06-05 – 2012-06-08 (×4): 40 mg via SUBCUTANEOUS
  Filled 2012-06-05 (×4): qty 0.4

## 2012-06-05 MED ORDER — PROMETHAZINE HCL 25 MG/ML IJ SOLN
12.5000 mg | Freq: Four times a day (QID) | INTRAMUSCULAR | Status: DC | PRN
  Administered 2012-06-05 – 2012-06-06 (×2): 12.5 mg via INTRAVENOUS
  Filled 2012-06-05 (×2): qty 1

## 2012-06-05 MED ORDER — PANTOPRAZOLE SODIUM 40 MG IV SOLR
40.0000 mg | INTRAVENOUS | Status: DC
  Administered 2012-06-05 – 2012-06-08 (×3): 40 mg via INTRAVENOUS
  Filled 2012-06-05 (×4): qty 40

## 2012-06-05 NOTE — ED Notes (Signed)
Pt ambulatory to BR no distress noted.  Husband at the bedside.

## 2012-06-05 NOTE — H&P (Signed)
Date: 06/05/2012               Patient Name:  Marissa Barrett MRN: [id number]  DOB: 22-Oct-1975 Age / Sex: 36 y.o., female   PCP: [facility])              Medical Service: Internal Medicine Teaching Service              Attending Physician: Dr. Dalphine Handing    First Contact: Dr. Heloise Beecham Pager: [phone]  Second Contact: Dr. Bosie Clos Pager: [phone]            After Hours (After 5p/  First Contact Pager: [phone]  weekends / holidays): Second Contact Pager: [phone]     Chief Complaint: N/V/Adominal pain  History of Present Illness: Patient is a 36 y.o. female with a PMHx of uncontrolled DM2 with presumed diabetic gastroparesis and well controlled asthma who presents to [facility] for evaluation of ongoing and refractory epigastric abdominal pain, nausea, and vomiting since 02/2012, which has been worse over the last 1 month. The patient wass admitted to [facility] for the same complaints from 11/5-11/15/2013, and states that she had a full evaluation that was unrevealing. She was also admitted to [facility] in 04/2012 for the same issue, EGD was performed showing only mild gastritis (not on recommended PPI at this time). Gastric emptying study was unsuccessful because the patient was unable to tolerate ingestion of the egg.   Secondary to ongoing nausea/ vomiting, the patient has been only been able to consume ice chips x 1 month. Continues to have 10 episodes of a bilious vomiting daily and is unable to keep down her medications regularly secondary to the  Patient describes a constant, stabbing epigastric pain that does not radiate. Aggravating factors include: none.  Alleviating factors include: occasionally better after urination. Associated symptoms include: anorexia, nausea and vomiting. The patient denies diarrhea, dysuria, fever and flatus. She denies associated chest pain, palpitations, shortness of breath, new rashes.   The patient's nausea and vomiting symptoms  were attempted to be controlled in the ED, however, after prolonged observation, admission is requested for ongoing symptom control and further evauluation if warranted.     Review of Systems: Constitutional:  admits to diaphoresis, decreased appetite, fatigue. Denies fever, chills, diaphoresis, appetite change and fatigue.  HEENT: denies photophobia, eye pain, redness, hearing loss, ear pain, congestion, sore throat, rhinorrhea, sneezing, neck pain, neck stiffness and tinnitus.  Respiratory: denies SOB, DOE, cough, chest tightness, and wheezing.  Cardiovascular: denies chest pain, palpitations and leg swelling.  Gastrointestinal: admits to nausea, vomiting, abdominal pain. Denies diarrhea, constipation, blood in stool.  Genitourinary: denies dysuria, urgency, frequency, hematuria, flank pain and difficulty urinating.  Musculoskeletal: denies  myalgias, back pain, joint swelling, arthralgias and gait problem.   Skin: denies pallor, rash and wound.  Neurological: denies dizziness, seizures, syncope, weakness, light-headedness, numbness and headaches.   Hematological: denies adenopathy, easy bruising, personal or family bleeding history.  Psychiatric/ Behavioral: admits to anxiety. Denies suicidal ideation, mood changes, confusion, nervousness, sleep disturbance and agitation.    Current Medications:  ER Administered Medications: Medication Dose  . [COMPLETED] diphenhydrAMINE (BENADRYL) injection 12.5 mg  12.5 mg  . diphenhydrAMINE (BENADRYL) injection 12.5 mg  12.5 mg  . [COMPLETED] HYDROmorphone (DILAUDID) injection 1 mg  1 mg  . [COMPLETED] lactated ringers bolus 1,000 mL  1,000 mL  . [COMPLETED] lactated ringers bolus 1,000 mL  1,000 mL  . lactated ringers infusion    . [COMPLETED]  LORazepam (ATIVAN) injection 1 mg  1 mg  . [COMPLETED] LORazepam (ATIVAN) injection 1 mg  1 mg  . [COMPLETED] metoCLOPramide (REGLAN) injection 10 mg  10 mg  . metoCLOPramide (REGLAN) injection 10 mg   10 mg  . [COMPLETED] morphine 4 MG/ML injection 4 mg  4 mg  . ondansetron (ZOFRAN) injection 4 mg  4 mg  . [COMPLETED] promethazine (PHENERGAN) injection 25 mg  25 mg  . [COMPLETED] promethazine (PHENERGAN) injection 25 mg  25 mg  . promethazine (PHENERGAN) injection 25 mg  25 mg    Current Outpatient Prescriptions Medication Sig  . albuterol (PROVENTIL HFA;VENTOLIN HFA) 108 (90 BASE) MCG/ACT inhaler Inhale 2 puffs into the lungs every 6 (six) hours as needed. Wheezing or shortness of breath  . citalopram (CELEXA) 20 MG tablet Take 20 mg by mouth daily.  [name] Kitchen dicyclomine (BENTYL) 20 MG tablet Take 20 mg by mouth 3 (three) times daily as needed. For stomach pain  . HYDROcodone-acetaminophen (NORCO) 7.5-325 MG per tablet Take 1 tablet by mouth every 6 (six) hours as needed. For pain  . insulin detemir (LEVEMIR) 100 UNIT/ML injection Inject 25 Units into the skin at bedtime.  . lansoprazole (PREVACID) 30 MG capsule Take 30 mg by mouth 2 (two) times daily.  . metoCLOPramide (REGLAN) 10 MG tablet Take 10 mg by mouth every 6 (six) hours as needed. For stomach upset  . promethazine (PHENERGAN) 25 MG tablet Take 1 tablet (25 mg total) by mouth every 6 (six) hours as needed for nausea.  . traMADol (ULTRAM) 50 MG tablet Take 100 mg by mouth daily as needed. For pain    Allergies: Allergies  Allergen Reactions  . Kiwi Extract Anaphylaxis, Itching and Swelling    And tongue swelling     Past Medical History: Past Medical History  Diagnosis Date  . Interstitial cystitis   . Asthma   . Diabetes mellitus 2006    Requiring insulin  . HLD (hyperlipidemia)   . Gastritis     mild per EGD (04/2012)  . Gastroparesis     presumed diagnosis - unable to tolerate gastric emptying study.    Past Surgical History: Past Surgical History  Procedure Date  . Abdominal hysterectomy   . Esophagogastroduodenoscopy 05/09/2012    Procedure: ESOPHAGOGASTRODUODENOSCOPY (EGD);  Surgeon: Charna Elizabeth, MD;   Location: [facility] ENDOSCOPY;  Service: Endoscopy;  Laterality: N/A;    Family History: Family History  Problem Relation Age of Onset  . Sarcoidosis Mother   . Hypertension Mother   . Diabetes Mother   . Cerebral aneurysm Maternal Grandmother     Social History: History   Social History  . Marital Status: Married    Spouse Name: N/A    Number of Children: 2  . Years of Education: college   Occupational History  . unemployed    Social History Main Topics  . Smoking status: Never Smoker   . Smokeless tobacco: Never Used  . Alcohol Use: No     Comment: rarely  . Drug Use: No  . Sexually Active: Not on file   Other Topics Concern  . Not on file   Social History Narrative   Lives in [place] with her family.     Vital Signs: Blood pressure 143/97, pulse 116, temperature 98 F (36.7 C), temperature source Oral, resp. rate 20, last menstrual period 02/15/2003, SpO2 99.00%.  Physical Exam: General: Vital signs reviewed and noted. Well-developed, well-nourished, in no acute distress; alert, appropriate and cooperative throughout examination. Fairly  somnolent because the patient was just administered morphine prior to evaluation.  Head: Normocephalic, atraumatic.  Eyes: PERRL, EOMI, No signs of anemia or jaundince.  Nose: Mucous membranes moist, not inflammed, nonerythematous.  Throat: Oropharynx nonerythematous, dry mucous membranes no exudate appreciated.   Neck: No deformities, masses, or tenderness noted. Supple,  no JVD.  Lungs:  Normal respiratory effort. Clear to auscultation BL without crackles or wheezes.  Heart: RRR. S1 and S2 normal without gallop, murmur, or rubs.  Abdomen:  BS normoactive. Soft, Nondistended, epigastric and upper quadrant pain. No masses or organomegaly.  Extremities: No pretibial edema.  Neurologic: A&O X3, CN II - XII are grossly intact. Motor strength is 5/5 in the all 4 extremities, Sensations intact to light touch.  Skin: No visible rashes,  scars.   Lab results:  CURRENT LABS: CBC:    Component Value Date/Time   WBC 10.6* 06/04/2012 1200   WBC 6.4 02/15/2012 0849   HGB 15.3* 06/05/2012 0754   HGB 15.6 02/15/2012 0849   HCT 45.0 06/05/2012 0754   HCT 50.7* 02/15/2012 0849   PLT 439* 06/04/2012 1200   MCV 77.3* 06/04/2012 1200   MCV 87.4 02/15/2012 0849   NEUTROABS 7.8* 06/04/2012 1200   LYMPHSABS 1.9 06/04/2012 1200   MONOABS 0.9 06/04/2012 1200   EOSABS 0.0 06/04/2012 1200   BASOSABS 0.0 06/04/2012 1200     Metabolic Panel:    Component Value Date/Time   NA 135 06/05/2012 0754   K 3.4* 06/05/2012 0754   CL 98 06/05/2012 0754   CO2 25 06/05/2012 0647   BUN 7 06/05/2012 0754   CREATININE 0.50 06/05/2012 0754   CREATININE 0.62 02/15/2012 0835   GLUCOSE 297* 06/05/2012 0754   CALCIUM 9.1 06/05/2012 0647   AST 16 06/04/2012 1200   ALT 18 06/04/2012 1200   ALKPHOS 93 06/04/2012 1200   BILITOT 0.4 06/04/2012 1200   PROT 8.8* 06/04/2012 1200   ALBUMIN 3.7 06/04/2012 1200     Urinalysis:  Basename 06/05/12 0233  COLORURINE YELLOW  LABSPEC 1.021  PHURINE 5.0  GLUCOSEU 500*  HGBUR NEGATIVE  BILIRUBINUR NEGATIVE  KETONESUR 15*  PROTEINUR NEGATIVE  UROBILINOGEN 1.0  NITRITE NEGATIVE  LEUKOCYTESUR SMALL*     Drugs of Abuse  No results found for this basename: labopia,  cocainscrnur,  labbenz,  amphetmu,  thcu,  labbarb     Troponin (Point of Care Test) No results found for this basename: TROPIPOC in the last 72 hours   HISTORICAL LABS: Lab Results  Component Value Date   HGBA1C 12.3* 05/05/2012    Lab Results  Component Value Date   CHOL 266* 02/15/2012   HDL 35* 02/15/2012   LDLCALC 195* 02/15/2012   TRIG 181* 02/15/2012   CHOLHDL 7.6 02/15/2012    Lab Results  Component Value Date   TSH 4.953* 02/15/2012     Imaging results:   Dg Abd 2 Views (06/04/2012) - IMPRESSION: No acute abnormality.   Original Report Authenticated By: Janeece Riggers, M.D.      Other results:  EKG (06/05/2012) -  pending.    Assessment & Plan:  Pt is a 36 y.o. yo female with a PMHx of uncontrolled DM2 (A1c 12.3 in 04/2012) with ongoing and uncontrolled gastroparesis, HLD, and anxiety who was admitted on 06/04/2012 with symptoms of refractory nausea and vomiting, which has been extensively worked up with most recent hospitalization at [facility] in 05/2012. Interventions at this time will be focused on symptom control.    1) Abdominal  pain, nausea, vomiting - likely secondary to her uncontrolled diabetic gastroparesis in the setting of poorly controlled diabetes mellitus. She also has mild elevation of her lipase > 3x above normal, therefore, diagnosis of pancreatitis could be made (no clear precipitating cause and last CT in 03/2012 shows no GB pathology). Patient may also be experiencing rebound N/V from abrupt cessation of Reglan (if recent inability to tolerate 2/2 worsening symptoms). Although UA shows few bacteria in her urine, WBC < 10, and patient remains without fevers - therefore, pyelonephritis is very less likely. Other differentials considered, but thought to be less likely include celiac disease or pancreatic insufficiency in setting of her DM. Lastly, the patient has been diagnosed with gastritis, recommended PPI, and remains without this home medication.  Plan: - Admit to med-surg bed - IV NS @150  cc/hr + K - NPO except sips/ chips - Requested records from [facility] - to evaluate already completed workup. - Check UDS. - Continue home Bentyl, Reglan - PRN IV Zofran, Reglan, Phenergan for refractory N/V - IV morphine for pain, transition soon. - Start IV protonix. - Check thyroid function tests.  2) Diabetes mellitus, type 2, uncontrolled, with complication (gastroparesis) - last A1c 12.3 - with hyperglycemia on admission. - Will start Levemir at slightly reduced dosage given NPO status, escalate as guided by her CBGs. - CBG q4h while NPO. - Needs ongoing diabetic  education and improved insulin compliance.  3) Anion gapped metabolic acidosis - on ED admission, patient had an AG metabolic acidosis with AG of 20, likely 2/2 starvation ketosis in setting of #1. Delta-delta is 1.3, confirms pure AG metabolic acidosis. The gapped closed (AG 10), by the time of my evaluation after fluid repletion. - Continue IVF. - Check lactic acid level.  4) Depression - continue Celexa.  5) HLD - Last LDL 195 in 02/2012. Not on a home statin. - Will need to be on statin at discharge or PCP follow-up.   DVT PPX - low molecular weight heparin  CODE STATUS - FULL CODE  CONSULTS PLACED - None, after review of records from [place], will consider consult GI [address]) if needed.  DISPO - Disposition is deferred at this time, awaiting stabilization of N/V symptoms.   Anticipated discharge in approximately 1-2 day(s).   The patient does have a current PCP (Dr. Clemens Catholic in [facility]), therefore will not be requiring OPC follow-up after discharge.   Lastly, the patient does not have transportation limitations that hinder transportation to clinic appointments.   SERVICE NEEDED AT DISCHARGE - TO BE DETERMINED DURING HOSPITAL COURSE         Y = Yes, Blank = No PT:   OT:   RN:   Equipment:   Other:      Signed: Priscella Mann, DO  PGY-3, Internal Medicine Resident 06/05/2012, 12:43 PM

## 2012-06-05 NOTE — ED Provider Notes (Signed)
Patient placed on dehydration protocol.  History of diabetic gastroparesis, was recently discharged from [facility] after an extended inpatient stay for treatment of same.   Gained some control of symptoms after several rounds of anti-emetics, with the most significant relief after lorazepam.  Patient has been sleeping intermittently without return of vomiting.  Jimmye Norman, NP 06/05/12 [id number]

## 2012-06-05 NOTE — ED Notes (Signed)
Dr. Saralyn Pilar in at the bedside.

## 2012-06-05 NOTE — ED Notes (Signed)
Ice chips provided to pt.  She states that the zofran did decrease her nausea.  No distress noted.

## 2012-06-05 NOTE — ED Notes (Signed)
Assisted patient to the BR.  Amb without difficulty, urine obtained and sent to the lab.  Back to bed and c/o nausea.  No vomiting noted.  Dr. Lavella Lemons notified

## 2012-06-05 NOTE — ED Provider Notes (Signed)
Physical Exam  BP 148/72  Pulse 110  Temp 98.5 F (36.9 C) (Oral)  Resp 20  SpO2 99%  LMP 02/15/2003  Physical Exam On examination she appeared in nontoxic but uncomfortable. Vital signs as documented. Skin warm but ashy (to bilateral arms) without overt rashes. Neck without JVD. Lungs clear. Heart exam notable for regular rhythm, normal sounds and absence of murmurs, rubs or gallops. Abdomen with tenderness to LUQ and epigastric without guarding, rebound tenderness, masses, or abdominal aortic enlargement. Extremities nonedematous.  ED Course  Procedures  MDM Pt with hx of DM and gastro paresis presents with upper abd pain with n/v.  She was recently hospitalized at [facility] Nov 5th-15th for same complaint.  Pt sts she was released prematurely and now her sxs has returned.  She continues to endorse abd pain and nausea despite receiving treatment here in the CDU.  Her lab is remarkable for a lipase of 239, and CBG of 265.  Lipase is higher than her baseline.  In the setting of elevated lipase and upper abdominal pain, her symptoms are suggestive of pancreatitis.    Initially pt has Anion Gap of 20, with ketone in urine.  Pt has been receiving IVF throughout the day.  Will recheck her electrolytes and will give PO trial.    9:08 AM Pt's AG normalized on repeat BMP.  However, Na+ is 127, and lipase elevated to 244.  Pt still cannot tolerates PO.  Care discussed with my attending.    10:37 AM Pt endorse increasing abd pain and nausea.  Has tried to eat ice chip but subsequently vomits.  Will consult for admission for further management of her sxs.    10:40 AM I have consulted with Internal Medicine-Unassigned, who agrees to see pt in ED and will admit. Pt aware of plan.    BP 143/97  Pulse 116  Temp 98 F (36.7 C) (Oral)  Resp 20  SpO2 99%  LMP 02/15/2003  I have reviewed nursing notes and vital signs. I personally reviewed the imaging tests through PACS system  I  reviewed available ER/hospitalization records thought the EMR  Results for orders placed during the hospital encounter of 06/04/12  CBC WITH DIFFERENTIAL      Component Value Range   WBC 10.6 (*) 4.0 - 10.5 K/uL   RBC 5.24 (*) 3.87 - 5.11 MIL/uL   Hemoglobin 14.4  12.0 - 15.0 g/dL   HCT 16.1  09.6 - 04.5 %   MCV 77.3 (*) 78.0 - 100.0 fL   MCH 27.5  26.0 - 34.0 pg   MCHC 35.6  30.0 - 36.0 g/dL   RDW 40.9  81.1 - 91.4 %   Platelets 439 (*) 150 - 400 K/uL   Neutrophils Relative 73  43 - 77 %   Neutro Abs 7.8 (*) 1.7 - 7.7 K/uL   Lymphocytes Relative 18  12 - 46 %   Lymphs Abs 1.9  0.7 - 4.0 K/uL   Monocytes Relative 8  3 - 12 %   Monocytes Absolute 0.9  0.1 - 1.0 K/uL   Eosinophils Relative 0  0 - 5 %   Eosinophils Absolute 0.0  0.0 - 0.7 K/uL   Basophils Relative 0  0 - 1 %   Basophils Absolute 0.0  0.0 - 0.1 K/uL  COMPREHENSIVE METABOLIC PANEL      Component Value Range   Sodium 130 (*) 135 - 145 mEq/L   Potassium 3.9  3.5 - 5.1 mEq/L  Chloride 92 (*) 96 - 112 mEq/L   CO2 18 (*) 19 - 32 mEq/L   Glucose, Bld 366 (*) 70 - 99 mg/dL   BUN 16  6 - 23 mg/dL   Creatinine, Ser 5.62 (*) 0.50 - 1.10 mg/dL   Calcium 13.0 (*) 8.4 - 10.5 mg/dL   Total Protein 8.8 (*) 6.0 - 8.3 g/dL   Albumin 3.7  3.5 - 5.2 g/dL   AST 16  0 - 37 U/L   ALT 18  0 - 35 U/L   Alkaline Phosphatase 93  39 - 117 U/L   Total Bilirubin 0.4  0.3 - 1.2 mg/dL   GFR calc non Af Amer >90  >90 mL/min   GFR calc Af Amer >90  >90 mL/min  LIPASE, BLOOD      Component Value Range   Lipase 239 (*) 11 - 59 U/L  URINALYSIS, ROUTINE W REFLEX MICROSCOPIC      Component Value Range   Color, Urine YELLOW  YELLOW   APPearance TURBID (*) CLEAR   Specific Gravity, Urine 1.021  1.005 - 1.030   pH 5.0  5.0 - 8.0   Glucose, UA 500 (*) NEGATIVE mg/dL   Hgb urine dipstick NEGATIVE  NEGATIVE   Bilirubin Urine NEGATIVE  NEGATIVE   Ketones, ur 15 (*) NEGATIVE mg/dL   Protein, ur NEGATIVE  NEGATIVE mg/dL   Urobilinogen, UA 1.0   0.0 - 1.0 mg/dL   Nitrite NEGATIVE  NEGATIVE   Leukocytes, UA SMALL (*) NEGATIVE  POCT PREGNANCY, URINE      Component Value Range   Preg Test, Ur NEGATIVE  NEGATIVE  URINE MICROSCOPIC-ADD ON      Component Value Range   Squamous Epithelial / LPF FEW (*) RARE   WBC, UA 7-10  <3 WBC/hpf   Bacteria, UA MANY (*) RARE  GLUCOSE, CAPILLARY      Component Value Range   Glucose-Capillary 265 (*) 70 - 99 mg/dL  LIPASE, BLOOD      Component Value Range   Lipase 244 (*) 11 - 59 U/L  BASIC METABOLIC PANEL      Component Value Range   Sodium 127 (*) 135 - 145 mEq/L   Potassium 3.1 (*) 3.5 - 5.1 mEq/L   Chloride 92 (*) 96 - 112 mEq/L   CO2 25  19 - 32 mEq/L   Glucose, Bld 296 (*) 70 - 99 mg/dL   BUN 9  6 - 23 mg/dL   Creatinine, Ser 8.65 (*) 0.50 - 1.10 mg/dL   Calcium 9.1  8.4 - 78.4 mg/dL   GFR calc non Af Amer >90  >90 mL/min   GFR calc Af Amer >90  >90 mL/min  POCT I-STAT, CHEM 8      Component Value Range   Sodium 135  135 - 145 mEq/L   Potassium 3.4 (*) 3.5 - 5.1 mEq/L   Chloride 98  96 - 112 mEq/L   BUN 7  6 - 23 mg/dL   Creatinine, Ser 6.96  0.50 - 1.10 mg/dL   Glucose, Bld 295 (*) 70 - 99 mg/dL   Calcium, Ion 2.84  1.32 - 1.23 mmol/L   TCO2 24  0 - 100 mmol/L   Hemoglobin 15.3 (*) 12.0 - 15.0 g/dL   HCT 44.0  10.2 - 72.5 %   Dg Abd 2 Views  06/04/2012  *RADIOLOGY REPORT*  Clinical Data: Abdominal pain with vomiting  ABDOMEN - 2 VIEW  Comparison: 05/17/2012  Findings: Normal bowel gas  pattern.  No bowel obstruction. Negative for free air.  No bony abnormality.  Electrical lead is present overlying the right sacrum, unchanged.  This does not appear to be connected to a medical device.  No kidney stones.  IMPRESSION: No acute abnormality.   Original Report Authenticated By: Janeece Riggers, M.D.    Dg Abd Acute W/chest  05/17/2012  *RADIOLOGY REPORT*  Clinical Data: Nausea and vomiting.  ACUTE ABDOMEN SERIES (ABDOMEN 2 VIEW & CHEST 1 VIEW)  Comparison: 05/05/2012.  Findings: Lung  volumes are normal.  No consolidative airspace disease.  No pleural effusions.  No pneumothorax.  No pulmonary nodule or mass noted.  Pulmonary vasculature and the cardiomediastinal silhouette are within normal limits.  Gas and stool are seen scattered throughout the colon extending to the level of the distal rectum.  No pathologic distension of small bowel is noted.  No gross evidence of pneumoperitoneum. Postoperative changes are again seen projecting over the lower right hemi pelvis.  IMPRESSION: 1.  Nonobstructive bowel gas pattern. 2.  No pneumoperitoneum. 3.  No radiographic evidence of acute cardiopulmonary disease.   Original Report Authenticated By: Trudie Reed, M.D.       Fayrene Helper, PA-C 06/05/12 1041

## 2012-06-05 NOTE — ED Provider Notes (Signed)
Medical screening examination/treatment/procedure(s) were performed by non-physician practitioner and as supervising physician I was immediately available for consultation/collaboration.   Justine Cossin W Arlayne Liggins, MD 06/05/12 1637 

## 2012-06-05 NOTE — ED Notes (Signed)
Pt tolerating PO ice chips at the present.  States that she is less nauseated now.

## 2012-06-06 DIAGNOSIS — R112 Nausea with vomiting, unspecified: Secondary | ICD-10-CM

## 2012-06-06 DIAGNOSIS — E1149 Type 2 diabetes mellitus with other diabetic neurological complication: Secondary | ICD-10-CM

## 2012-06-06 DIAGNOSIS — R109 Unspecified abdominal pain: Secondary | ICD-10-CM

## 2012-06-06 DIAGNOSIS — K3184 Gastroparesis: Secondary | ICD-10-CM

## 2012-06-06 LAB — GLUCOSE, CAPILLARY
Glucose-Capillary: 224 mg/dL — ABNORMAL HIGH (ref 70–99)
Glucose-Capillary: 250 mg/dL — ABNORMAL HIGH (ref 70–99)

## 2012-06-06 LAB — URINE CULTURE

## 2012-06-06 LAB — COMPREHENSIVE METABOLIC PANEL
AST: 11 U/L (ref 0–37)
Alkaline Phosphatase: 86 U/L (ref 39–117)
BUN: 8 mg/dL (ref 6–23)
CO2: 27 mEq/L (ref 19–32)
Chloride: 97 mEq/L (ref 96–112)
Creatinine, Ser: 0.57 mg/dL (ref 0.50–1.10)
GFR calc non Af Amer: >90 mL/min (ref >90)
Total Bilirubin: 0.7 mg/dL (ref 0.3–1.2)

## 2012-06-06 LAB — CBC
MCH: 28.2 pg (ref 26.0–34.0)
Platelets: 331 10*3/uL (ref 150–400)
RBC: 5.63 MIL/uL — ABNORMAL HIGH (ref 3.87–5.11)
RDW: 11.8 % (ref 11.5–15.5)
WBC: 8.7 10*3/uL (ref 4.0–10.5)

## 2012-06-06 MED ORDER — BUPROPION HCL 100 MG PO TABS
100.0000 mg | ORAL_TABLET | Freq: Two times a day (BID) | ORAL | Status: DC
  Administered 2012-06-06 – 2012-06-07 (×2): 100 mg via ORAL
  Filled 2012-06-06 (×7): qty 1

## 2012-06-06 MED ORDER — PROMETHAZINE HCL 25 MG RE SUPP: 25.0000 mg | Freq: Four times a day (QID) | RECTAL | Status: DC | PRN

## 2012-06-06 MED ORDER — DICYCLOMINE HCL 20 MG PO TABS
20.0000 mg | ORAL_TABLET | Freq: Four times a day (QID) | ORAL | Status: AC
  Filled 2012-06-06: qty 1

## 2012-06-06 MED ORDER — DICYCLOMINE HCL 10 MG/ML IM SOLN: 20.0000 mg | Freq: Three times a day (TID) | INTRAMUSCULAR | Status: DC

## 2012-06-06 MED ORDER — DICYCLOMINE HCL 10 MG/ML IM SOLN
20.0000 mg | Freq: Four times a day (QID) | INTRAMUSCULAR | Status: AC
  Administered 2012-06-06: 20 mg via INTRAMUSCULAR
  Filled 2012-06-06: qty 2

## 2012-06-06 MED ORDER — PROMETHAZINE HCL 25 MG PO TABS
25.0000 mg | ORAL_TABLET | Freq: Four times a day (QID) | ORAL | Status: DC | PRN
  Filled 2012-06-06: qty 1

## 2012-06-06 MED ORDER — PROMETHAZINE HCL 25 MG/ML IJ SOLN
25.0000 mg | Freq: Four times a day (QID) | INTRAMUSCULAR | Status: DC | PRN
  Administered 2012-06-06 – 2012-06-08 (×5): 25 mg via INTRAMUSCULAR
  Filled 2012-06-06 (×7): qty 1

## 2012-06-06 MED ORDER — KETOROLAC TROMETHAMINE 30 MG/ML IJ SOLN
30.0000 mg | Freq: Four times a day (QID) | INTRAMUSCULAR | Status: AC | PRN
  Administered 2012-06-06 – 2012-06-07 (×3): 30 mg via INTRAVENOUS
  Filled 2012-06-06 (×3): qty 1

## 2012-06-06 NOTE — H&P (Signed)
INTERNAL MEDICINE TEACHING SERVICE Attending Admission Note  Date: 06/06/2012  Patient name: Marissa Barrett  Medical record number: [id number]  Date of birth: 04/07/1976    I have seen and evaluated Lelon Mast and discussed their care with the Residency Team.  35 yr. Old AAF w/ pmhx significant for uncontrolled Type 2 DM, asthma, obesity, HL, presumed gastroparesis, presented with N/V/abdominal pain.  She states she has been admitted numerous times before without much improvement.  She states she has been having almost daily vomiting since 02/2012 that has recently increased in frequency.  She was recently admitted at [facility] in the past two weeks and discharged, only to have return of symptoms.  She has had an EGD in the past with evidence of mild gastritis but was unable to tolerate a GES.    She further states that she has had decreased PO intake over the past month and has been able to tolerate ice chips only.  She has been having 10+ episodes of vomiting daily and unable to keep medications down.    Laboratory studies were significant for a K of 3.4, albumin of 3.7, glucose of 297, hgba1c of 12.3%, lipase of 244.  She was recently admitted in October 2013 and there was concern for intractable nausea due to narcotic use and chronic constipation.  At that time she was given Erythomycin tid, reglan qid, protonix daily, and vicodin prn.  Physical Exam: Blood pressure 127/82, pulse 114, temperature 98.5 F (36.9 C), temperature source Oral, resp. rate 14, weight 204 lb 12.9 oz (92.9 kg), last menstrual period 02/15/2003, SpO2 96.00%.  General: Vital signs reviewed and noted. Well-developed, well-nourished, in no acute distress; alert, appropriate and cooperative throughout examination.  Head: Normocephalic, atraumatic.  Eyes: PERRL, EOMI, No signs of anemia or jaundince.  Nose: Mucous membranes moist, not inflammed, nonerythematous.  Throat: Oropharynx nonerythematous, no exudate  appreciated.   Neck: No deformities, masses, or tenderness noted.Supple, No carotid Bruits, no JVD.  Lungs:  Normal respiratory effort. Clear to auscultation BL without crackles or wheezes.  Heart: RRR. S1 and S2 normal without gallop, murmur, or rubs.  Abdomen:  BS hypoactive. Soft. Diffuse mild tenderness without rebound tenderness. Obese. No guarding.  Extremities: No pretibial edema.  Neurologic: A&O X3, CN II - XII are grossly intact. Motor strength is 5/5 in the all 4 extremities, Sensations intact to light touch, Cerebellar signs negative.  Skin: No visible rashes, scars.    Lab results: Results for orders placed during the hospital encounter of 06/04/12 (from the past 24 hour(s))  CBC     Status: Abnormal   Collection Time   06/05/12  4:37 PM      Component Value Range   WBC 7.1  4.0 - 10.5 K/uL   RBC 5.35 (*) 3.87 - 5.11 MIL/uL   Hemoglobin 15.1 (*) 12.0 - 15.0 g/dL   HCT 40.9  81.1 - 91.4 %   MCV 78.7  78.0 - 100.0 fL   MCH 28.2  26.0 - 34.0 pg   MCHC 35.9  30.0 - 36.0 g/dL   RDW 78.2  95.6 - 21.3 %   Platelets 345  150 - 400 K/uL  CREATININE, SERUM     Status: Abnormal   Collection Time   06/05/12  4:37 PM      Component Value Range   Creatinine, Ser 0.37 (*) 0.50 - 1.10 mg/dL   GFR calc non Af Amer >90  >90 mL/min   GFR calc Af Amer >90  >  90 mL/min  GLUCOSE, CAPILLARY     Status: Abnormal   Collection Time   06/05/12  6:03 PM      Component Value Range   Glucose-Capillary 280 (*) 70 - 99 mg/dL   Comment 1 Documented in Chart     Comment 2 Notify RN    GLUCOSE, CAPILLARY     Status: Abnormal   Collection Time   06/05/12 10:23 PM      Component Value Range   Glucose-Capillary 256 (*) 70 - 99 mg/dL  COMPREHENSIVE METABOLIC PANEL     Status: Abnormal   Collection Time   06/06/12  6:05 AM      Component Value Range   Sodium 132 (*) 135 - 145 mEq/L   Potassium 3.4 (*) 3.5 - 5.1 mEq/L   Chloride 97  96 - 112 mEq/L   CO2 27  19 - 32 mEq/L   Glucose, Bld 217 (*)  70 - 99 mg/dL   BUN 8  6 - 23 mg/dL   Creatinine, Ser 2.13  0.50 - 1.10 mg/dL   Calcium 9.2  8.4 - 08.6 mg/dL   Total Protein 7.4  6.0 - 8.3 g/dL   Albumin 3.1 (*) 3.5 - 5.2 g/dL   AST 11  0 - 37 U/L   ALT 13  0 - 35 U/L   Alkaline Phosphatase 86  39 - 117 U/L   Total Bilirubin 0.7  0.3 - 1.2 mg/dL   GFR calc non Af Amer >90  >90 mL/min   GFR calc Af Amer >90  >90 mL/min  CBC     Status: Abnormal   Collection Time   06/06/12  6:05 AM      Component Value Range   WBC 8.7  4.0 - 10.5 K/uL   RBC 5.63 (*) 3.87 - 5.11 MIL/uL   Hemoglobin 15.9 (*) 12.0 - 15.0 g/dL   HCT 57.8  46.9 - 62.9 %   MCV 78.2  78.0 - 100.0 fL   MCH 28.2  26.0 - 34.0 pg   MCHC 36.1 (*) 30.0 - 36.0 g/dL   RDW 52.8  41.3 - 24.4 %   Platelets 331  150 - 400 K/uL  GLUCOSE, CAPILLARY     Status: Abnormal   Collection Time   06/06/12  7:41 AM      Component Value Range   Glucose-Capillary 217 (*) 70 - 99 mg/dL   Comment 1 Notify RN    GLUCOSE, CAPILLARY     Status: Abnormal   Collection Time   06/06/12 12:02 PM      Component Value Range   Glucose-Capillary 224 (*) 70 - 99 mg/dL   Comment 1 Notify RN      Imaging results:  Dg Abd 2 Views  06/04/2012  *RADIOLOGY REPORT*  Clinical Data: Abdominal pain with vomiting  ABDOMEN - 2 VIEW  Comparison: 05/17/2012  Findings: Normal bowel gas pattern.  No bowel obstruction. Negative for free air.  No bony abnormality.  Electrical lead is present overlying the right sacrum, unchanged.  This does not appear to be connected to a medical device.  No kidney stones.  IMPRESSION: No acute abnormality.   Original Report Authenticated By: Janeece Riggers, M.D.      Assessment and Plan: I agree with the formulated Assessment and Plan with the following changes:  35 yr. Old AAF w/ pmhx significant for uncontrolled Type 2 DM, asthma, obesity, HL, presumed gastroparesis, presented with N/V/abdominal pain. 1) Abdominal pain/N/V: GI Consulted.  This is likely related to functional bowel  problem (diabetic gastroparesis or narcotic use). Avoid narcotic medications. IV Reglan, IV zofran for nausea. Replace K as needed and keep on IVF. I don't think this is pancreatitis, lipase likely elevated due to vomiting.  2) Type 2 DM: Follow BG closely and watch for hypoglycemia due to decreased PO intake. She has uncontrolled DM, so as soon as she can tolerate a diet, this needs to be controlled tightly. 3) Depression: May consider switching to another antidepressant other than an SSRI to see if this helps with nausea, though I really think this is diabetes related or narcotic related. 4) rest per resident note. Follow and replace electrolytes.  Jonah Blue, DO 11/23/201312:36 PM

## 2012-06-06 NOTE — Progress Notes (Addendum)
Subjective: Patient complaining of persistent nausea, vomiting and epigastric pain overnight.  GI has seen this morning and reviewed prior work-up records. Recommend better control of DM, 4-5 small meals a day, wean narcotics, and change antidepressant to one with less N/V side effects. Denies CP, SOB, dizziness, diarrhea.  Objective: Vital signs in last 24 hours: Filed Vitals:   06/05/12 1545 06/05/12 1642 06/05/12 2104 06/06/12 0553  BP: 150/102 165/105 156/97 127/82  Pulse:  105 107 114  Temp:  99.1 F (37.3 C) 98.2 F (36.8 C) 98.5 F (36.9 C)  TempSrc:   Oral Oral  Resp:  18 18 14   Weight:    204 lb 12.9 oz (92.9 kg)  SpO2:  100% 100% 96%   Weight change:   Intake/Output Summary (Last 24 hours) at 06/06/12 1135 Last data filed at 06/06/12 0600  Gross per 24 hour  Intake 1445.83 ml  Output      0 ml  Net 1445.83 ml   Vitals reviewed. General: resting in bed, NAD HEENT: PERRL, EOMI, no scleral icterus Cardiac: RRR, no rubs, murmurs or gallops Pulm: clear to auscultation bilaterally, no wheezes, rales, or rhonchi Abd: soft, tenderness in all quadrants to deep palpation but worse in epigastrum, no rebound or guarding Ext: warm and well perfused, no pedal edema Neuro: alert and oriented X3, cranial nerves II-XII grossly intact, strength and sensation to light touch equal in bilateral upper and lower extremities Psych: Depressed mood/affect  Lab Results: Basic Metabolic Panel:  Lab 06/06/12 1610 06/05/12 1637 06/05/12 0754 06/05/12 0647  NA 132* -- 135 --  K 3.4* -- 3.4* --  CL 97 -- 98 --  CO2 27 -- -- 25  GLUCOSE 217* -- 297* --  BUN 8 -- 7 --  CREATININE 0.57 0.37* -- --  CALCIUM 9.2 -- -- 9.1  MG -- -- -- --  PHOS -- -- -- --   Liver Function Tests:  Lab 06/06/12 0605 07/02/2012 1200  AST 11 16  ALT 13 18  ALKPHOS 86 93  BILITOT 0.7 0.4  PROT 7.4 8.8*  ALBUMIN 3.1* 3.7    Lab 06/05/12 0637 07-02-12 1200  LIPASE 244* 239*  AMYLASE -- --    CBC:  Lab 06/06/12 0605 06/05/12 1637 Jul 02, 2012 1200  WBC 8.7 7.1 --  NEUTROABS -- -- 7.8*  HGB 15.9* 15.1* --  HCT 44.0 42.1 --  MCV 78.2 78.7 --  PLT 331 345 --   CBG:  Lab 06/06/12 0741 06/05/12 2223 06/05/12 1803 06/05/12 0511  GLUCAP 217* 256* 280* 265*   Thyroid Function Tests:  Lab 06/05/12 1046  TSH 4.016  T4TOTAL --  FREET4 0.89  T3FREE --  THYROIDAB --   Urinalysis:  Lab 06/05/12 0233  COLORURINE YELLOW  LABSPEC 1.021  PHURINE 5.0  GLUCOSEU 500*  HGBUR NEGATIVE  BILIRUBINUR NEGATIVE  KETONESUR 15*  PROTEINUR NEGATIVE  UROBILINOGEN 1.0  NITRITE NEGATIVE  LEUKOCYTESUR SMALL*   Studies/Results: Dg Abd 2 Views  07-02-2012  *RADIOLOGY REPORT*  Clinical Data: Abdominal pain with vomiting  ABDOMEN - 2 VIEW  Comparison: 05/17/2012  Findings: Normal bowel gas pattern.  No bowel obstruction. Negative for free air.  No bony abnormality.  Electrical lead is present overlying the right sacrum, unchanged.  This does not appear to be connected to a medical device.  No kidney stones.  IMPRESSION: No acute abnormality.   Original Report Authenticated By: Janeece Riggers, M.D.    Medications: I have reviewed the patient's current medications. Scheduled Meds:   .  citalopram  20 mg Oral Daily  . dicyclomine  20 mg Intramuscular QID   Or  . dicyclomine  20 mg Oral QID  . enoxaparin (LOVENOX) injection  40 mg Subcutaneous Q24H  . insulin aspart  0-9 Units Subcutaneous TID WC  . insulin detemir  15 Units Subcutaneous QHS  . [COMPLETED] ketorolac  30 mg Intravenous Once  . metoCLOPramide (REGLAN) injection  10 mg Intravenous Q6H  . pantoprazole (PROTONIX) IV  40 mg Intravenous Q24H  . [DISCONTINUED] dicyclomine  20 mg Intramuscular TID  . [DISCONTINUED] dicyclomine  20 mg Oral TID AC   Continuous Infusions:   . sodium chloride 0.9 % 1,000 mL with potassium chloride 10 mEq infusion 125 mL/hr at 06/06/12 0218  . [DISCONTINUED] lactated ringers 150 mL/hr at 06/05/12 1223    PRN Meds:.albuterol, ketorolac, ondansetron (ZOFRAN) IV, traMADol, [DISCONTINUED] diphenhydrAMINE, [DISCONTINUED] promethazine, [DISCONTINUED] promethazine  Assessment/Plan: Pt is a 36 y.o. yo female with a PMHx of uncontrolled DM2 (A1c 12.3 in 04/2012) with ongoing and uncontrolled gastroparesis, HLD, and anxiety who was admitted on 06/04/2012 with symptoms of refractory nausea and vomiting, which has been extensively worked up with most recent hospitalization at [facility] in 05/2012. Interventions at this time will be focused on symptom control.   1) Abdominal pain, nausea, vomiting  Likely secondary to her uncontrolled diabetic gastroparesis in the setting of poorly controlled diabetes mellitus. She also has mild elevation of her lipase > 3x above normal, but do not believe dx of pancreatitis at this time, more likely 2/2 to nausea and vomiting. . Patient may also be experiencing rebound N/V from abrupt cessation of Reglan (if recent inability to tolerate 2/2 worsening symptoms).  GI has seen, suspect gastroparesis in setting of uncontrolled DM and negative extensive endoscopic work up.  - GI recommends continue reglan IV-->po, wean narcotics, 4-5 small meals daily and change antidepressant to one with less N/V side effects - Clear diet, advance as tolerated - NO NARCOTIC PAIN MEDS. IV toradol only - IM bentyl as pt not tolerating po - nutrition consult - anticipate d/c when symptoms improve, will need f/u w endocrinologist  2) Diabetes mellitus, type 2, uncontrolled, with complication (gastroparesis) - last A1c 12.3 -  With hyperglycemia on admission.  - Levemir at 15U (decreased dose in setting of NPO) - CBG qac and qhs w SSI - Needs ongoing diabetic education and improved insulin compliance.   3) Anion gapped metabolic acidosis -  Resolved since admission, suspect starvation KA.    4) Depression  Change celexa to bupropion (less GI side effects). No cross tapering as pt has  not tolerated po meds for almost one week.   5) HLD -  Last LDL 195 in 02/2012. Not on a home statin.  - Will need to be on statin at discharge or PCP follow-up.   Dispo: Disposition is deferred at this time, awaiting improvement of current medical problems.  Anticipated discharge in approximately 1-2 day(s).   The patient does not have a current PCP (DEFAULT,PROVIDER, MD), therefore will not be requiring OPC follow-up after discharge.   The patient does not have transportation limitations that hinder transportation to clinic appointments.  .Services Needed at time of discharge: Y = Yes, Blank = No PT:   OT:   RN:   Equipment:   Other:     LOS: 2 days   Bronson Curb 06/06/2012, 11:35 AM

## 2012-06-06 NOTE — Consult Note (Signed)
[facility] Consult:                                       For Dr Loreta Ave 9:23 AM 06/06/2012   Referring Provider: Dr Saralyn Pilar.   Primary Care Physician:  Vista Lawman, [facility] Primary Gastroenterologist:  Dr. Nicholes Mango  Reason for Consultation:  Chronic nausea and vomiting with epigastric pain  HPI: Marissa Barrett is a 36 y.o. female.  Obese, type 2 insulin dependent diabetic.  2004 TAH/BSO for menorrhagia. Narcotic requiring epigastric pain. Surgery for interstitial cystitis.  In august started having severe epigastric pain, slightly worse after foods.  Treated with Hydrocodone and started to have persistent N/V and pain continued.  H pylori diagnosed in [place] based on a stool test (pt report).  This was treated with 10 days or so of abx/ppi in August/September 10/22 - 05/10/12 admission to [facility] with n/v/epigastric pain.  EGD with patchy gastritis.  Cause presumed to be gastroparesis.  Unable to complete gastric emptying study as she did not tolerate the "labelled" egg.  Discharge meds included Erythromycin tid, Reglan 10 mg qid, Protonix once daily, Vicodin prn.   Admitted to [facility] with same GI sxs 11/5 - 11/15.  Had repeat EGD, but no repeat CT or ultrasound.   Says she can not have MRI due to retained metal suture vs staples from prior bladder surgeryl MDs there stopped the E-mycin but continued the PPI and Reglan.  Ultrasound abdomen 03/2012 was negative. CT scan 03/2012 showed left lower quadrant position of cecum, moderate burden of stool,   She was admitted 11/21 with recurrent, refractory n/v and epigastric pain. Unable to keep down pos.  Her renal function is WNL, K slightly low.  LFTs normal. uA negative.  Hgb a1c in 10/22 was 12.3.  2 view abdomen films are unremarkable.     Admits to depression but no increased social stressors at home with husband, kids and mother.  No NSAIDs. No etoh.  No ilicit  drugs.  No musc skeletal pain.  Sugars average in 250s, low as 70s, highs into low 400s.  Weight loss of 20 # since August.  Never had a colonoscopy.  Chronic constipation is not new, not that much worse since using hydrocodone 4 x daily.  BMs once per week, last was 8 days ago.  Occasionally sees blood on tissue. Passing stools requires straining.  Does not use enemas at home.  No vaginal bleeding post hysterectomy.  Not on any estrogen supplements, though Estradiol was Rxd in past.  No skin sores, ulcers, rash or pruritus.  No oliguria or hematuria.  No headaches.  Wears glasses "For everything". Had anemia related to menorrhagia and was transfused rbcs in 2003.  No family hx colon ca or gb /liver/pancreas disease.   Past Medical History  Diagnosis Date  . Interstitial cystitis   . Asthma   . HLD (hyperlipidemia)   . Gastritis     mild per EGD (04/2012)  . Gastroparesis     presumed diagnosis - unable to tolerate gastric emptying study.  . Exertional dyspnea   . Type II diabetes mellitus 2006    Requiring insulin  . History of blood transfusion 2003  . GERD (gastroesophageal reflux disease)   . Migraines   . Daily headache   . Anxiety     Past Surgical History  Procedure Date  . Esophagogastroduodenoscopy 05/09/2012  Procedure: ESOPHAGOGASTRODUODENOSCOPY (EGD);  Surgeon: Charna Elizabeth, MD;  Location: [facility] ENDOSCOPY;  Service: Endoscopy;  Laterality: N/A;  . Abdominal exploration surgery 2007    "related to interstitial cystitis" (06/05/2012)  . Abdominal hysterectomy 2004    Prior to Admission medications   Medication Sig Start Date End Date Taking? Authorizing Provider  citalopram (CELEXA) 20 MG tablet Take 20 mg by mouth daily.   Yes Historical Provider, MD  dicyclomine (BENTYL) 20 MG tablet Take 20 mg by mouth 3 (three) times daily as needed. For stomach pain   Yes Historical Provider, MD  HYDROcodone-acetaminophen (NORCO) 7.5-325 MG per tablet Take 1 tablet by mouth every  6 (six) hours as needed. For pain   Yes Historical Provider, MD  insulin detemir (LEVEMIR) 100 UNIT/ML injection Inject 25 Units into the skin at bedtime.   Yes Historical Provider, MD  lansoprazole (PREVACID) 30 MG capsule Take 30 mg by mouth 2 (two) times daily.   Yes Historical Provider, MD  metoCLOPramide (REGLAN) 10 MG tablet Take 10 mg by mouth every 6 (six) hours as needed. For stomach upset   Yes Historical Provider, MD  promethazine (PHENERGAN) 25 MG tablet Take 1 tablet (25 mg total) by mouth every 6 (six) hours as needed for nausea. 05/17/12  Yes Richardean Canal, MD  traMADol (ULTRAM) 50 MG tablet Take 100 mg by mouth daily as needed. For pain   Yes Historical Provider, MD  albuterol (PROVENTIL HFA;VENTOLIN HFA) 108 (90 BASE) MCG/ACT inhaler Inhale 2 puffs into the lungs every 6 (six) hours as needed. Wheezing or shortness of breath    Historical Provider, MD    Scheduled Meds:    . citalopram  20 mg Oral Daily  . dicyclomine  20 mg Intramuscular QID   Or  . dicyclomine  20 mg Oral QID  . enoxaparin (LOVENOX) injection  40 mg Subcutaneous Q24H  . insulin aspart  0-9 Units Subcutaneous TID WC  . insulin detemir  15 Units Subcutaneous QHS  . [COMPLETED] ketorolac  30 mg Intravenous Once  . metoCLOPramide (REGLAN) injection  10 mg Intravenous Q6H  . [COMPLETED]  morphine injection  4 mg Intravenous Once  . pantoprazole (PROTONIX) IV  40 mg Intravenous Q24H   Infusions:   . sodium chloride 0.9 % 1,000 mL with potassium chloride 10 mEq infusion 125 mL/hr at 06/06/12 0218   PRN Meds: albuterol, ketorolac, ondansetron (ZOFRAN) IV, traMADol,   Allergies as of 06/04/2012 - Review Complete 06/04/2012  Allergen Reaction Noted  . Kiwi extract Anaphylaxis, Itching, and Swelling 03/28/2012    Family History  Problem Relation Age of Onset  . Sarcoidosis Mother   . Hypertension Mother   . Diabetes Mother   . Cerebral aneurysm Maternal Grandmother     History   Social History  .  Marital Status: Married    Spouse Name: N/A    Number of Children: 2  . Years of Education: college   Occupational History  . unemployed    Social History Main Topics  . Smoking status: Never Smoker   . Smokeless tobacco: Never Used  . Alcohol Use: No     Comment: rarely  . Drug Use: No  . Sexually Active: Not Currently   Other Topics Concern  . Not on file   Social History Narrative   Lives in [place] with her family.    REVIEW OF SYSTEMS: As per HPI.  12 system review completed No flu shot for 2013.    PHYSICAL EXAM: Vital  signs in last 24 hours: Temp:  [98 F (36.7 C)-99.1 F (37.3 C)] 98.5 F (36.9 C) (11/23 0553) Pulse Rate:  [105-116] 114  (11/23 0553) Resp:  [14-18] 14  (11/23 0553) BP: (127-165)/(82-105) 127/82 mmHg (11/23 0553) SpO2:  [96 %-100 %] 96 % (11/23 0553) Weight:  [92.9 kg (204 lb 12.9 oz)] 92.9 kg (204 lb 12.9 oz) (11/23 0553)  General: depressed, room in total darkness,  head:  No asymmetry or trauma Eyes:  Poor eye contact.  EOMI.  No icterus or pallor Ears:  Not HOH  Nose:  No congestion or discharge Mouth:  Moist, clear oral MM and pharynx.  Good teeth Neck:  No mass, bruits or JVD Lungs:  Clear, poor insp effort despite request to breath deep. Heart: RRR.  No MRG Abdomen:  Soft, moderately obese, active BS, no HSM/masses/bruits. Diffusely tender worse in upper abdomen.  No gusrd or rebound.   Rectal: scant brown stool is FOB negative.  No mass.  Rectal tone not diminished.    Musc/Skeltl: no joint deformity or swelling Extremities:  No pedal edema  Neurologic:  No tremor, no asterixis.  Oriented x 3.   Skin:  No rash or sores Tattoos:  None seen Nodes:  No cervical or inguinal adenopathy   Psych:  Very flat, depressed affect.  Head is turned down towards abdomen during bulk of interview.  Not crying. Answers questions appropriately and cooperative.   Intake/Output from previous day: 11/22 0701 - 11/23 0700 In: 1445.8  [I.V.:1445.8] Out: -  Intake/Output this shift:    LAB RESULTS:  Basename 06/06/12 0605 06/05/12 1637 06/05/12 0754 06/04/12 1200  WBC 8.7 7.1 -- 10.6*  HGB 15.9* 15.1* 15.3* --  HCT 44.0 42.1 45.0 --  PLT 331 345 -- 439*   BMET Lab Results  Component Value Date   NA 132* 06/06/2012   NA 135 06/05/2012   NA 127* 06/05/2012   K 3.4* 06/06/2012   K 3.4* 06/05/2012   K 3.1* 06/05/2012   CL 97 06/06/2012   CL 98 06/05/2012   CL 92* 06/05/2012   CO2 27 06/06/2012   CO2 25 06/05/2012   CO2 18* 06/04/2012   GLUCOSE 217* 06/06/2012   GLUCOSE 297* 06/05/2012   GLUCOSE 296* 06/05/2012   BUN 8 06/06/2012   BUN 7 06/05/2012   BUN 9 06/05/2012   CREATININE 0.57 06/06/2012   CREATININE 0.37* 06/05/2012   CREATININE 0.50 06/05/2012   CALCIUM 9.2 06/06/2012   CALCIUM 9.1 06/05/2012   CALCIUM 10.7* 06/04/2012   LFT  Basename 06/06/12 0605 06/04/12 1200  PROT 7.4 8.8*  ALBUMIN 3.1* 3.7  AST 11 16  ALT 13 18  ALKPHOS 86 93  BILITOT 0.7 0.4  BILIDIR -- --  IBILI -- --   URINE  Ref. Range 06/05/2012 02:33  Color, Urine Latest Range: YELLOW  YELLOW  APPearance Latest Range: CLEAR  TURBID (A)  Specific Gravity, Urine Latest Range: 1.005-1.030  1.021  pH Latest Range: 5.0-8.0  5.0  Glucose Latest Range: NEGATIVE mg/dL 657 (A)  Bilirubin Urine Latest Range: NEGATIVE  NEGATIVE  Ketones, ur Latest Range: NEGATIVE mg/dL 15 (A)  Protein Latest Range: NEGATIVE mg/dL NEGATIVE  Urobilinogen, UA Latest Range: 0.0-1.0 mg/dL 1.0  Nitrite Latest Range: NEGATIVE  NEGATIVE  Leukocytes, UA Latest Range: NEGATIVE  SMALL (A)  Hgb urine dipstick Latest Range: NEGATIVE  NEGATIVE  WBC, UA Latest Range: <3 WBC/hpf 7-10  Squamous Epithelial / LPF Latest Range: RARE  FEW (A)  Bacteria, UA Latest Range: RARE  MANY (A)    PT/INR No results found for this basename: INR, PROTIME   Hepatitis Panel No results found for this basename: HEPBSAG,HCVAB,HEPAIGM,HEPBIGM in the last 72  hours C-Diff No components found with this basename: cdiff    Drugs of Abuse  No results found for this basename: labopia, cocainscrnur, labbenz, amphetmu, thcu, labbarb     RADIOLOGY STUDIES: Dg Abd 2 Views 06/04/2012  *RADIOLOGY REPORT*  Clinical Data: Abdominal pain with vomiting  ABDOMEN - 2 VIEW  Comparison: 05/17/2012  Findings: Normal bowel gas pattern.  No bowel obstruction. Negative for free air.  No bony abnormality.  Electrical lead is present overlying the right sacrum, unchanged.  This does not appear to be connected to a medical device.  No kidney stones.  IMPRESSION: No acute abnormality.   Original Report Authenticated By: Janeece Riggers, M.D.    03/21/2012  CT ABDOMEN WITH CONTRAST 1. No explanation for patient's intermittent abdominal pain.  2. Moderate colonic stool burden without evidence of obstruction.  3. The cecum is incidentally noted to be within the left lower  abdominal quadrant. This finding without evidence of obstruction  or pericecal stranding, however in the absence of prior  examinations is of indeterminate chronicity.  03/21/2012  ULTRASOUND ABDOMEN IMPRESSION:  Negative abdominal ultrasound.   ENDOSCOPIC STUDIES: 04/2012   EGD  Dr Loreta Ave for n/v, epigastric pain.  To second part duodenum.  IMPRESSION: Patchy gastritis; otherwise, normal  esophagogastroduodenoscopy.  RECOMMENDATIONS:1. Anti-reflux regimen to be followed.  2. Continue PPI's.  REPEAT EXAM:. No repeat EGD planned.   IMPRESSION: *  Functional epigastric pain *  Nausea ,  Vomiting.  Narcotic s/e as well as likely diabetic gastroparesis *  Bacteruria.  Few WBCs in urine.  *  Chronic constipation *  Depression.   On Celexa which is new.  Not on dc med list of 10/27.  First listed s/e on [facility] is nausea. It may not be the best choice for her to treat depression. *  Poorly controlled type 2 IDDM  PLAN: *  Would detox off narcotics.  Continue the Reglan IV for now  *  Though she has  not had colonoscopy, doubt this will be useful in finding source of her issues.  *  Consider psych eval for anti depressant med input.  It may be better to use something other than Celexa.    LOS: 2 days   Jennye Moccasin  06/06/2012, 9:23 AM Pager: [phone]      ________________________________________________________________________  [facility] MD note:  I personally examined the patient, reviewed the data and agree with the assessment and plan described above.  I think it is safe to presume she has gastroparesis.  Her Hb A1c last month was 12.3 which shows her DM is out of control. This can significantly slow stomach function and really is probably her overall biggest health concern.  She eats 2 meals a day, but with gastroparesis should change so that she is eating 4-5 small meals a day (be a grazer) instead.  She takes narcotic pain meds several times per day and this makes her stomach function even worse. Need to try to take absolutely as little narcotic pain med as possible.  Can continue IV reglan for now, but as soon as possible it should be changed to PO.   Recs: much better DM control, wean of narcotics, eat 4-5 small meals per day rather than 2 larger ones.   Rob Bunting, MD [facility] Pager [phone]

## 2012-06-06 NOTE — Progress Notes (Signed)
INITIAL ADULT NUTRITION ASSESSMENT Date: 06/06/2012   Time: 1:35 PM Reason for Assessment: MST  Pt meets criteria for SEVERE MALNUTRITION in the context of chronic illness as evidenced by 10% weight loss x 1 month, po intake </= 75% of her needs for >/= 1 month, severe muscle wasting.  INTERVENTION: 1. Supplement diet once advanced, if unable to advance diet with tolerance recommend initiating parenteral nutrition support in pt who meets criteria for severe malnutrition.  2. Provide diet education for gastroparesis as needed prior to d/c  DOCUMENTATION CODES Per approved criteria  -Severe malnutrition in the context of chronic illness -Obesity Unspecified    ASSESSMENT: Female 36 y.o.  Dx: Chronic nausea and vomiting with epigastric pain  Hx:  Past Medical History  Diagnosis Date  . Interstitial cystitis   . Asthma   . HLD (hyperlipidemia)   . Gastritis     mild per EGD (04/2012)  . Gastroparesis     presumed diagnosis - unable to tolerate gastric emptying study.  . Exertional dyspnea   . Type II diabetes mellitus 2006    Requiring insulin  . History of blood transfusion 2003  . GERD (gastroesophageal reflux disease)   . Migraines   . Daily headache   . Anxiety    Past Surgical History  Procedure Date  . Esophagogastroduodenoscopy 05/09/2012    Procedure: ESOPHAGOGASTRODUODENOSCOPY (EGD);  Surgeon: Charna Elizabeth, MD;  [facility];  Service: Endoscopy;  Laterality: N/A;  . Abdominal exploration surgery 2007    "related to interstitial cystitis" (06/05/2012)  . Abdominal hysterectomy 2004   Related Meds:  Scheduled Meds:    . buPROPion  100 mg Oral BID  . [COMPLETED] dicyclomine  20 mg Intramuscular QID   Or  . [COMPLETED] dicyclomine  20 mg Oral QID  . enoxaparin (LOVENOX) injection  40 mg Subcutaneous Q24H  . insulin aspart  0-9 Units Subcutaneous TID WC  . insulin detemir  15 Units Subcutaneous QHS  . [COMPLETED] ketorolac  30 mg Intravenous Once    . metoCLOPramide (REGLAN) injection  10 mg Intravenous Q6H  . pantoprazole (PROTONIX) IV  40 mg Intravenous Q24H  . [DISCONTINUED] citalopram  20 mg Oral Daily  . [DISCONTINUED] dicyclomine  20 mg Intramuscular TID  . [DISCONTINUED] dicyclomine  20 mg Oral TID AC   Continuous Infusions:    . sodium chloride 0.9 % 1,000 mL with potassium chloride 10 mEq infusion 125 mL/hr at 06/06/12 1219  . [DISCONTINUED] lactated ringers 150 mL/hr at 06/05/12 1223   PRN Meds:.albuterol, ketorolac, ondansetron (ZOFRAN) IV, traMADol, [DISCONTINUED] diphenhydrAMINE, [DISCONTINUED] promethazine, [DISCONTINUED] promethazine  Ht:   165.1 cm (5'5")  Wt: 204 lb 12.9 oz (92.9 kg)  Ideal Wt:    56.8 kg % Ideal Wt: 164%  Usual Wt:  Wt Readings from Last 10 Encounters:  06/06/12 204 lb 12.9 oz (92.9 kg)  05/09/12 226 lb (102.513 kg)  05/09/12 226 lb (102.513 kg)  04/05/12 230 lb (104.327 kg)  03/21/12 230 lb (104.327 kg)  02/26/12 230 lb 6.4 oz (104.509 kg)  02/15/12 229 lb (103.874 kg)   % Usual Wt: 90%  BMI: 34 - obesity class I  Food/Nutrition Related Hx: 10% weight loss x 1 month  Labs:  CMP     Component Value Date/Time   NA 132* 06/06/2012 0605   K 3.4* 06/06/2012 0605   CL 97 06/06/2012 0605   CO2 27 06/06/2012 0605   GLUCOSE 217* 06/06/2012 0605   BUN 8 06/06/2012 2130  CREATININE 0.57 06/06/2012 0605   CREATININE 0.62 02/15/2012 0835   CALCIUM 9.2 06/06/2012 0605   PROT 7.4 06/06/2012 0605   ALBUMIN 3.1* 06/06/2012 0605   AST 11 06/06/2012 0605   ALT 13 06/06/2012 0605   ALKPHOS 86 06/06/2012 0605   BILITOT 0.7 06/06/2012 0605   GFRNONAA >90 06/06/2012 0605   GFRAA >90 06/06/2012 0605   CBG (last 3)   Basename 06/06/12 1202 06/06/12 0741 06/05/12 2223  GLUCAP 224* 217* 256*   Lab Results  Component Value Date   HGBA1C 12.3* 05/05/2012    Intake/Output Summary (Last 24 hours) at 06/06/12 1335 Last data filed at 06/06/12 0600  Gross per 24 hour  Intake 1445.83 ml   Output      0 ml  Net 1445.83 ml    Diet Order: Clear Liquid  Supplements/Tube Feeding: none  IVF:     sodium chloride 0.9 % 1,000 mL with potassium chloride 10 mEq infusion Last Rate: 125 mL/hr at 06/06/12 1219  [DISCONTINUED] lactated ringers Last Rate: 150 mL/hr at 06/05/12 1223   Nutrition Focused Physical Exam: Subcutaneous Fat:  Orbital Region: WNL Upper Arm Region: mild wasting Thoracic and Lumbar Region: WNL  Muscle:  Temple Region: severe wasting Clavicle Bone Region: WNL Clavicle and Acromion Bone Region: WNL Scapular Bone Region: WNL Dorsal Hand: WNL Patellar Region: WNL Anterior Thigh Region: severe wasting Posterior Calf Region: WNL  Edema: not present  Pt with hx of obesity and type 2 insulin dependent DM. Pt with hx of epigastric pain requiring narcotics. Pt had surgery for interstitial cystitis. In August pt started having epigastric pain worse after foods and persistent N/V. Pt admitted 10/22-10/27 at [facility] work up determined gastroparesis but pt unable to complete gastric emptying study. Pt admitted at [facility] for same 11/5-11/15. Pt d/c'ed on PPI and Reglan. Pt admitted to [facility] 11/21 for recurrent, refractory N/V, epigastric pain. Pt with hx of chronic constipation.  Potassium being repleted.  Per discussion with pt she reports very poor intake since to Oct 2013 admission. Since that time she has been in and out of the hospital not eating.    Estimated Nutritional Needs:   Kcal:  1900-2100 Protein:  95-110 grams Fluid:  >1.9 L/day  NUTRITION DIAGNOSIS: Malnutrition related to pain,N/V as evidenced by 10% weight loss within one month, muscle wasting, very poor intake.   MONITORING/EVALUATION(Goals): Goal: Pt to meet >/= 90% of their estimated nutrition needs. Monitor: Diet advancement/tolerance, PO intake, weight trend  EDUCATION NEEDS: -Education not appropriate at this time  Kendell Bane RD, LDN, CNSC [phone] Weekend/After Hours  Pager  06/06/2012, 1:35 PM

## 2012-06-07 LAB — GLUCOSE, CAPILLARY: Glucose-Capillary: 184 mg/dL — ABNORMAL HIGH (ref 70–99)

## 2012-06-07 NOTE — Progress Notes (Signed)
Subjective: States that she doesn't feel much better but did "ok" overnight, continued nausea on phenergan and reglan Iv  Objective: Vital signs in last 24 hours: Filed Vitals:   06/06/12 1433 06/06/12 2052 06/06/12 2100 06/07/12 0601  BP: 154/107 143/102  154/91  Pulse: 109 110  117  Temp: 98.4 F (36.9 C) 98.9 F (37.2 C)  97.5 F (36.4 C)  TempSrc: Oral Oral  Oral  Resp: 16 16  16   Height:   5\' 4"  (1.626 m)   Weight:    200 lb 13.4 oz (91.1 kg)  SpO2: 98% 100%  100%   Weight change: -3 lb 15.5 oz (-1.8 kg)  Intake/Output Summary (Last 24 hours) at 06/07/12 0818 Last data filed at 06/06/12 1800  Gross per 24 hour  Intake   1300 ml  Output      0 ml  Net   1300 ml   Physical Exam: General: resting in bed, NAD, breakfast tray at bedside untouched Cardiac: RRR, no rubs, murmurs or gallops Pulm: clear to auscultation bilaterally, no wheezes, rales, or rhonchi Abd: soft, tenderness in all quadrants to deep palpation but worse in epigastrum, no rebound or guarding Neuro: non-focal, alert and oriented X3, Psych: Depressed mood/affect, appropriate eye contact  Lab Results: Basic Metabolic Panel:  Lab 06/06/12 4098 06/05/12 1637 06/05/12 0754 06/05/12 0647  NA 132* -- 135 --  K 3.4* -- 3.4* --  CL 97 -- 98 --  CO2 27 -- -- 25  GLUCOSE 217* -- 297* --  BUN 8 -- 7 --  CREATININE 0.57 0.37* -- --  CALCIUM 9.2 -- -- 9.1  MG -- -- -- --  PHOS -- -- -- --   Liver Function Tests:  Lab 06/06/12 0605 06/04/12 1200  AST 11 16  ALT 13 18  ALKPHOS 86 93  BILITOT 0.7 0.4  PROT 7.4 8.8*  ALBUMIN 3.1* 3.7    Lab 06/05/12 0637 06/04/12 1200  LIPASE 244* 239*  AMYLASE -- --   CBC:  Lab 06/06/12 0605 06/05/12 1637 06/04/12 1200  WBC 8.7 7.1 --  NEUTROABS -- -- 7.8*  HGB 15.9* 15.1* --  HCT 44.0 42.1 --  MCV 78.2 78.7 --  PLT 331 345 --   CBG:  Lab 06/07/12 0737 06/06/12 2214 06/06/12 1716 06/06/12 1202 06/06/12 0741 06/05/12 2223  GLUCAP 217* 193* 250* 224* 217*  256*   Thyroid Function Tests:  Lab 06/05/12 1046  TSH 4.016  T4TOTAL --  FREET4 0.89  T3FREE --  THYROIDAB --   Urinalysis:  Lab 06/05/12 0233  COLORURINE YELLOW  LABSPEC 1.021  PHURINE 5.0  GLUCOSEU 500*  HGBUR NEGATIVE  BILIRUBINUR NEGATIVE  KETONESUR 15*  PROTEINUR NEGATIVE  UROBILINOGEN 1.0  NITRITE NEGATIVE  LEUKOCYTESUR SMALL*   Studies/Results: No results found. Medications: I have reviewed the patient's current medications. Scheduled Meds:    . buPROPion  100 mg Oral BID  . [COMPLETED] dicyclomine  20 mg Intramuscular QID   Or  . [COMPLETED] dicyclomine  20 mg Oral QID  . enoxaparin (LOVENOX) injection  40 mg Subcutaneous Q24H  . insulin aspart  0-9 Units Subcutaneous TID WC  . insulin detemir  15 Units Subcutaneous QHS  . metoCLOPramide (REGLAN) injection  10 mg Intravenous Q6H  . pantoprazole (PROTONIX) IV  40 mg Intravenous Q24H  . [DISCONTINUED] citalopram  20 mg Oral Daily   Continuous Infusions:    . sodium chloride 0.9 % 1,000 mL with potassium chloride 10 mEq infusion 125 mL/hr  at 06/06/12 1219   PRN Meds:.albuterol, [COMPLETED] ketorolac, ondansetron (ZOFRAN) IV, promethazine, promethazine, promethazine, traMADol, [DISCONTINUED] promethazine  Assessment/Plan: HD#3 for 36 y.o. yo female with a PMHx of uncontrolled DM2 (A1c 12.3 in 04/2012) with ongoing and uncontrolled gastroparesis, HLD, and anxiety who was admitted on 06/04/2012 with symptoms of refractory nausea and vomiting, which has been extensively worked up with most recent hospitalization at [facility] in 05/2012. Interventions at this time will be focused on symptom control.   1) Abdominal pain, nausea, vomiting  Likely secondary to her uncontrolled diabetic gastroparesis in the setting of poorly controlled diabetes mellitus. Appreciate GI recommendations.  - continue reglan IV-->po, wean narcotics, 4-5 small meals daily and change antidepressant to one with less N/V side effects  per GI - Clear diet, advance as tolerated - NO NARCOTIC PAIN MEDS. IV toradol only - cont IM bentyl as pt not tolerating po - anticipate d/c when symptoms improve, will need f/u w endocrinologist -cont to monitor I/O, weights, albumin currently 3.1  Intake/Output Summary (Last 24 hours) at 06/07/12 0923 Last data filed at 06/06/12 1800  Gross per 24 hour  Intake   1300 ml  Output      0 ml  Net   1300 ml    2) Diabetes mellitus, type 2, uncontrolled, with complication (gastroparesis) - last A1c 12.3 -  With hyperglycemia on admission.  - cont Levemir at 15U (decreased dose in setting of NPO) - cont CBGs  - Needs ongoing diabetic education and improved insulin compliance.  CBG (last 3)   Basename 06/07/12 0737 06/06/12 2214 06/06/12 1716  GLUCAP 217* 193* 250*     3) Anion gapped metabolic acidosis - resolved   4) Depression: Change celexa to bupropion yesterday (less GI side effects). No cross tapering as pt has not tolerated po meds for almost one week, likely psych component to her presentation -cont to monitor    Dispo: Disposition is deferred at this time, awaiting improvement of current medical problems.  Anticipated discharge in approximately 1-2 day(s).   The patient does have a current PCP(Freda Hennesy, MD), therefore will not be requiring OPC follow-up after discharge.   The patient does not have transportation limitations that hinder transportation to clinic appointments.  .Services Needed at time of discharge: Y = Yes, Blank = No PT:   OT:   RN:   Equipment:   Other:     LOS: 3 days   Keanu Frickey 06/07/2012, 8:18 AM

## 2012-06-07 NOTE — ED Provider Notes (Signed)
I was available during the corresponding portion of this patient's CDU course for consult / assessment .  Gerhard Munch, MD 06/07/12 2224

## 2012-06-08 DIAGNOSIS — E1143 Type 2 diabetes mellitus with diabetic autonomic (poly)neuropathy: Secondary | ICD-10-CM | POA: Diagnosis present

## 2012-06-08 LAB — GLUCOSE, CAPILLARY
Glucose-Capillary: 231 mg/dL — ABNORMAL HIGH (ref 70–99)
Glucose-Capillary: 194 mg/dL — ABNORMAL HIGH (ref 70–99)

## 2012-06-08 MED ORDER — BUPROPION HCL 100 MG PO TABS: 100.0000 mg | ORAL_TABLET | Freq: Two times a day (BID) | ORAL | Status: DC

## 2012-06-08 MED ORDER — KETOROLAC TROMETHAMINE 30 MG/ML IJ SOLN
30.0000 mg | Freq: Four times a day (QID) | INTRAMUSCULAR | Status: DC | PRN
  Administered 2012-06-08 (×2): 30 mg via INTRAVENOUS
  Filled 2012-06-08 (×2): qty 1

## 2012-06-08 NOTE — Progress Notes (Addendum)
Subjective: Continued N/V/abd pain and no po intake despite IV reglan and antiemetics. At this point, patient warrants evaluation by gastric mobility specialist to explore options other J-tube or TPN. Her case has been discussed w Dr. Alycia Rossetti at [facility], who agrees to take patient as transfer. Denies CP, SOB, dizziness, diarrhea.   Objective: Vital signs in last 24 hours: Filed Vitals:   06/07/12 0601 06/07/12 1503 06/07/12 2124 06/08/12 0629  BP: 154/91 138/88 155/98 138/89  Pulse: 117 108 110 113  Temp: 97.5 F (36.4 C) 98.1 F (36.7 C) 97.8 F (36.6 C) 98.1 F (36.7 C)  TempSrc: Oral Oral Oral Oral  Resp: 16 18 16 18   Height:      Weight: 200 lb 13.4 oz (91.1 kg)   205 lb 0.4 oz (93 kg)  SpO2: 100% 100% 100% 100%   Weight change: 4 lb 3 oz (1.9 kg)  Intake/Output Summary (Last 24 hours) at 06/08/12 0858 Last data filed at 06/08/12 0600  Gross per 24 hour  Intake   3462 ml  Output      0 ml  Net   3462 ml   Physical Exam: General: Sitting up w emesis basin in lab, clear emesis in basin Cardiac: RRR, no rubs, murmurs or gallops Pulm: clear to auscultation bilaterally, no wheezes, rales, or rhonchi Abd: soft, tenderness in all quadrants to deep palpation but worse in epigastrum, no rebound or guarding Neuro: non-focal, alert and oriented X3, Psych: Depressed mood/affect   Lab Results: Basic Metabolic Panel:  Lab 06/06/12 1610 06/05/12 1637 06/05/12 0754 06/05/12 0647  NA 132* -- 135 --  K 3.4* -- 3.4* --  CL 97 -- 98 --  CO2 27 -- -- 25  GLUCOSE 217* -- 297* --  BUN 8 -- 7 --  CREATININE 0.57 0.37* -- --  CALCIUM 9.2 -- -- 9.1  MG -- -- -- --  PHOS -- -- -- --   Liver Function Tests:  Lab 06/06/12 0605 06/04/12 1200  AST 11 16  ALT 13 18  ALKPHOS 86 93  BILITOT 0.7 0.4  PROT 7.4 8.8*  ALBUMIN 3.1* 3.7    Lab 06/05/12 0637 06/04/12 1200  LIPASE 244* 239*  AMYLASE -- --   CBC:  Lab 06/06/12 0605 06/05/12 1637 06/04/12 1200  WBC 8.7 7.1 --  NEUTROABS --  -- 7.8*  HGB 15.9* 15.1* --  HCT 44.0 42.1 --  MCV 78.2 78.7 --  PLT 331 345 --   CBG:  Lab 06/08/12 0759 06/07/12 2200 06/07/12 1730 06/07/12 1221 06/07/12 0737 06/06/12 2214  GLUCAP 173* 184* 217* 189* 217* 193*   Thyroid Function Tests:  Lab 06/05/12 1046  TSH 4.016  T4TOTAL --  FREET4 0.89  T3FREE --  THYROIDAB --   Urinalysis:  Lab 06/05/12 0233  COLORURINE YELLOW  LABSPEC 1.021  PHURINE 5.0  GLUCOSEU 500*  HGBUR NEGATIVE  BILIRUBINUR NEGATIVE  KETONESUR 15*  PROTEINUR NEGATIVE  UROBILINOGEN 1.0  NITRITE NEGATIVE  LEUKOCYTESUR SMALL*   Medications: I have reviewed the patient's current medications. Scheduled Meds:    . buPROPion  100 mg Oral BID  . enoxaparin (LOVENOX) injection  40 mg Subcutaneous Q24H  . insulin aspart  0-9 Units Subcutaneous TID WC  . insulin detemir  15 Units Subcutaneous QHS  . metoCLOPramide (REGLAN) injection  10 mg Intravenous Q6H  . pantoprazole (PROTONIX) IV  40 mg Intravenous Q24H   Continuous Infusions:    . sodium chloride 0.9 % 1,000 mL with potassium chloride  10 mEq infusion 125 mL/hr at 06/08/12 0350   PRN Meds:.albuterol, ondansetron (ZOFRAN) IV, promethazine, promethazine, promethazine, traMADol  Assessment/Plan: HD#3 for 36 y.o. yo female with a PMHx of uncontrolled DM2 (A1c 12.3 in 04/2012) with ongoing and uncontrolled gastroparesis, HLD, and anxiety who was admitted on 06/04/2012 with symptoms of refractory nausea and vomiting, which has been extensively worked up with most recent hospitalization at [facility] in 05/2012. Interventions at this time will be focused on symptom control.   1) Abdominal pain, nausea, vomiting  Likely secondary to her uncontrolled diabetic gastroparesis in the setting of poorly controlled diabetes mellitus. Appreciate GI recommendations.  - continue reglan IV-->po, wean narcotics, 4-5 small meals daily and change antidepressant to one with less N/V side effects per GI - Clear diet,  advance as tolerated - NO NARCOTIC PAIN MEDS  - cont IM bentyl as pt not tolerating po - transfer to [facility] for GI motility eval  2) Diabetes mellitus, type 2, uncontrolled, with complication (gastroparesis) - last A1c 12.3 -  With hyperglycemia on admission.  - cont Levemir at 15U (decreased dose in setting of NPO) - cont CBGs  - Needs ongoing diabetic education and improved insulin compliance.   3) Anion gapped metabolic acidosis -  resolved   4) Depression:  Change celexa to bupropion yesterday (less GI side effects). No cross tapering as pt has not tolerated po meds for almost one week, likely psych component to her presentation. Patient still unable to keep any pills down    Dispo: Anticipate transfer today.   The patient does have a current PCP(Freda Hennesy, MD), therefore will not be requiring OPC follow-up after discharge.   The patient does not have transportation limitations that hinder transportation to clinic appointments.  .Services Needed at time of discharge: Y = Yes, Blank = No PT:   OT:   RN:   Equipment:   Other:     LOS: 4 days   Bronson Curb 06/08/2012, 8:58 AM  ----------------------------------------------------------------------------- Internal Medicine Teaching Service Attending Note Date: 06/08/2012  Patient name: Marissa Barrett  Medical record number: [id number]  Date of birth: 07/10/1976   Subjective I met with Marissa Barrett and her husband today at around 1030 am. Marissa Barrett states that she is still in a lot of pain, and has not eaten despite IV reglan & phenergan. She has been feeling full and bloated, and constantly vomited bringing up bilious material. She feels depressed, and is teary.   Vitals Filed Vitals:   06/07/12 2124 06/08/12 0629 06/08/12 1340 06/08/12 1416  BP: 155/98 138/89 144/104 137/90  Pulse: 110 113 113   Temp: 97.8 F (36.6 C) 98.1 F (36.7 C) 98.6 F (37 C)   TempSrc: Oral Oral    Resp: 16 18 20    Height:      Weight:   205 lb 0.4 oz (93 kg)    SpO2: 100% 100% 100%    Exam Vitals reviewed.  General: Depressed, teary, sitting in bed. HEENT: PERRL, EOMI, no scleral icterus. Heart: RRR, no rubs, murmurs or gallops. Lungs: Clear bilaterally, no wheezes, rales, or rhonchi. Abdomen: Tender diffusely, soft, nondistended, BS + Extremities: Warm, no pedal edema. Neuro: Alert and oriented X3, grossly non-focal. Psych: Depressed affect, no SI.  Relevant Labs  Lab 06/06/12 0605 06/05/12 1637 06/05/12 0754 06/05/12 0647 06/04/12 1200  NA 132* -- 135 127* 130*  K 3.4* -- 3.4* -- --  CL 97 -- 98 92* 92*  CO2 27 -- -- 25 18*  GLUCOSE 217* -- 297* 296* 366*  BUN 8 -- 7 9 16   CREATININE 0.57 0.37* 0.50 0.38* 0.42*  CALCIUM 9.2 -- -- 9.1 10.7*  MG -- -- -- -- --  PHOS -- -- -- -- --    Lab 06/06/12 0605 06/05/12 1637 06/05/12 0754 06/04/12 1200  HGB 15.9* 15.1* 15.3* --  HCT 44.0 42.1 45.0 --  WBC 8.7 7.1 -- 10.6*  PLT 331 345 -- 439*    Intake/Output Summary (Last 24 hours) at 06/08/12 1954 Last data filed at 06/08/12 1900  Gross per 24 hour  Intake   3702 ml  Output      0 ml  Net   3702 ml   Assessment & Plan:   Presumed Diabetic Gastroparesis: Marissa Barrett is a 36 year old lady with uncontrolled DM2 who has undergone extensive GI work up for nausea and vomiting. I have reviewed her charts from [facility], and discussed the care management with my team. I think at this point we are unable to offer her more options for treatments that might benefit her. She is currently on IV phenargan, reglan, pantoprazole, and zofran. She has no PO intake for a few days, so she might need a PEJ tube placement for nutrition. She is not responding to conservative measures and she might benefit from gastric electric stimulation. I talked to the patient about this possible option today, and about [facility] hospital in [place], which has a GI group specializing in gastroparesis.  Rest per resident note.     Thanks Aletta Edouard 06/08/2012, 7:45 PM

## 2012-06-08 NOTE — Progress Notes (Signed)
The patient is currently in the process of getting transferred to [facility]. Report was given to the receiving nurse Florencia Reasons at [facility]. Report on the patient was also given to [facility].

## 2012-06-08 NOTE — Discharge Summary (Signed)
Internal Medicine Teaching [facility] Discharge Note  Name: Marissa Barrett MRN: [id number] DOB: 06/27/1976 36 y.o.  Date of Admission: 06/04/2012 12:02 PM Date of Discharge: 06/08/2012 Attending Physician: Aletta Edouard, MD  Discharge Diagnosis: Principal Problem:  *Diabetic gastroparesis associated with type 2 diabetes mellitus Additional Problems:   Anion gap metabolic acidosis, resolved   Depression  Discharge Medications:   Medication List     As of 06/08/2012  9:43 AM    ASK your doctor about these medications         albuterol 108 (90 BASE) MCG/ACT inhaler   Commonly known as: PROVENTIL HFA;VENTOLIN HFA   Inhale 2 puffs into the lungs every 6 (six) hours as needed. Wheezing or shortness of breath      citalopram 20 MG tablet   Commonly known as: CELEXA   Take 20 mg by mouth daily.      dicyclomine 20 MG tablet   Commonly known as: BENTYL   Take 20 mg by mouth 3 (three) times daily as needed. For stomach pain      HYDROcodone-acetaminophen 7.5-325 MG per tablet   Commonly known as: NORCO   Take 1 tablet by mouth every 6 (six) hours as needed. For pain      insulin detemir 100 UNIT/ML injection   Commonly known as: LEVEMIR   Inject 25 Units into the skin at bedtime.      lansoprazole 30 MG capsule   Commonly known as: PREVACID   Take 30 mg by mouth 2 (two) times daily.      metoCLOPramide 10 MG tablet   Commonly known as: REGLAN   Take 10 mg by mouth every 6 (six) hours as needed. For stomach upset      promethazine 25 MG tablet   Commonly known as: PHENERGAN   Take 1 tablet (25 mg total) by mouth every 6 (six) hours as needed for nausea.      traMADol 50 MG tablet   Commonly known as: ULTRAM   Take 100 mg by mouth daily as needed. For pain         Disposition and follow-up:   Ms.Marissa Barrett was discharged from [facility] in stable condition.  At the hospital follow up visit please address: 1) Control of T2DM Ultimately,  better control of T2DM will help improve her diabetic gastroparesis. 2) Depression Unable to tolerate any po antidepressants here. We did recommend change from celexa to bupropion due to better GI tolerability.   Consultations:   [facility], Dr Christella Hartigan  Procedures Performed:  Dg Abd 2 Views  06/04/2012  *RADIOLOGY REPORT*  Clinical Data: Abdominal pain with vomiting  ABDOMEN - 2 VIEW  Comparison: 05/17/2012  Findings: Normal bowel gas pattern.  No bowel obstruction. Negative for free air.  No bony abnormality.  Electrical lead is present overlying the right sacrum, unchanged.  This does not appear to be connected to a medical device.  No kidney stones.  IMPRESSION: No acute abnormality.   Original Report Authenticated By: Janeece Riggers, M.D.    Dg Abd Acute W/chest  05/17/2012  *RADIOLOGY REPORT*  Clinical Data: Nausea and vomiting.  ACUTE ABDOMEN SERIES (ABDOMEN 2 VIEW & CHEST 1 VIEW)  Comparison: 05/05/2012.  Findings: Lung volumes are normal.  No consolidative airspace disease.  No pleural effusions.  No pneumothorax.  No pulmonary nodule or mass noted.  Pulmonary vasculature and the cardiomediastinal silhouette are within normal limits.  Gas and stool are seen scattered throughout the colon extending  to the level of the distal rectum.  No pathologic distension of small bowel is noted.  No gross evidence of pneumoperitoneum. Postoperative changes are again seen projecting over the lower right hemi pelvis.  IMPRESSION: 1.  Nonobstructive bowel gas pattern. 2.  No pneumoperitoneum. 3.  No radiographic evidence of acute cardiopulmonary disease.   Original Report Authenticated By: Trudie Reed, M.D.    Admission HPI:  Patient is a 36 y.o. female with a PMHx of uncontrolled DM2 with presumed diabetic gastroparesis and well controlled asthma who presents to [facility] for evaluation of ongoing and refractory epigastric abdominal pain, nausea, and vomiting since 02/2012, which has been worse over the last 1  month. The patient wass admitted to [facility] for the same complaints from 11/5-11/15/2013, and states that she had a full evaluation that was unrevealing. She was also admitted to [facility] in 04/2012 for the same issue, EGD was performed showing only mild gastritis (not on recommended PPI at this time). Gastric emptying study was unsuccessful because the patient was unable to tolerate ingestion of the egg.  Secondary to ongoing nausea/ vomiting, the patient has been only been able to consume ice chips x 1 month. Continues to have 10 episodes of a bilious vomiting daily and is unable to keep down her medications regularly secondary to the Patient describes a constant, stabbing epigastric pain that does not radiate. Aggravating factors include: none. Alleviating factors include: occasionally better after urination. Associated symptoms include: anorexia, nausea and vomiting. The patient denies diarrhea, dysuria, fever and flatus. She denies associated chest pain, palpitations, shortness of breath, new rashes.  The patient's nausea and vomiting symptoms were attempted to be controlled in the ED, however, after prolonged observation, admission is requested for ongoing symptom control and further evauluation if warranted.   Hospital Course by problem list:  1) Diabetic gastroparesis associated with type 2 diabetes mellitus Patient presented with 2-3 months of intractable nausea and vomiting, as well as 30 lb weight loss. She has had extensive GI work up here and at [facility]. She was diagnosed with H pylor in [place] based on a stool study, and was treated with 10 days of abx/ppi therapy. In September, she had a negative ultrasound of her abdmonen and CT scan which was unremarkable. In October (22-27), she was admitted to [facility] with symptoms of nausea/vomiting. EGD at the time showed patchy gastritis. She was diagnosed presumptively with gastroparesis, but could not tolerate emptying study  because she could not tolerate the "egg." She was dischargedon erythromycin TID, reglan TID, protonix daily, and Vicodin prn. Several days later (11/5-11/15), she presented to [facility] with the same symptoms. Repeat EGD was unchanged.  On this admission, she was unable to tolerate any liquids or solids without emesis, despite therapy with IV reglan, bentyl and phenergan. She was treated with toradol and tramadol for her pain. She did have elevated lipase of 239 on admission, likely due to protracted N/V and less likely pancreatitis. GI was consulted, and agreed with diagnosis of DM gastroparesis. They recommended better control of DM, frequent small meals, and weaning of narcotics. They also recommended changing antidepressant (celexa) to one with less GI side effects.  On hospital day 4, patient remained unable to tolerate any po intake and still with severe abdominal pain. Dr. Alycia Rossetti was called to discuss case and appropriateness of referral for evaluation of gastric stimulation device.  In terms of her diabetes, she was continued on home levemir, but at lower dose in light  of no po intake (15U at bedtime). Sliding scale insulin was also provided. Her last HbA1c one month prior was 12.3.  2)  Anion gapped metabolic acidosis    On ED admission, patient had an AG metabolic acidosis with AG of 20, likely 2/2 starvation ketosis in setting of #1. Delta-delta was 1.3, confirming pure AG metabolic acidosis. The gapped closed by hospital day 2.  3) Depression Patient with depressed mood and affect. Prescribed celexa, but unable to take 2/2 nausea and vomiting. Changed to bupropion due to less GI side effects. No cross tapering as pt has not tolerated po meds for almost one week.  Patient was not able to tolerate any pills during this admission.   Discharge Vitals:  BP 138/89  Pulse 113  Temp 98.1 F (36.7 C) (Oral)  Resp 18  Ht 5\' 4"  (1.626 m)  Wt 205 lb 0.4 oz (93 kg)  BMI 35.19 kg/m2  SpO2  100%  LMP 02/15/2003  Discharge Labs:  Results for orders placed during the hospital encounter of 06/04/12 (from the past 24 hour(s))  GLUCOSE, CAPILLARY     Status: Abnormal   Collection Time   06/07/12 12:21 PM      Component Value Range   Glucose-Capillary 189 (*) 70 - 99 mg/dL   Comment 1 Notify RN    GLUCOSE, CAPILLARY     Status: Abnormal   Collection Time   06/07/12  5:30 PM      Component Value Range   Glucose-Capillary 217 (*) 70 - 99 mg/dL   Comment 1 Notify RN    GLUCOSE, CAPILLARY     Status: Abnormal   Collection Time   06/07/12 10:00 PM      Component Value Range   Glucose-Capillary 184 (*) 70 - 99 mg/dL  GLUCOSE, CAPILLARY     Status: Abnormal   Collection Time   06/08/12  7:59 AM      Component Value Range   Glucose-Capillary 173 (*) 70 - 99 mg/dL   Comment 1 Notify RN      Signed: Bronson Curb 06/08/2012, 9:43 AM   Time Spent on Discharge: 35 min Services Ordered on Discharge: none Equipment Ordered on Discharge: none

## 2012-06-08 NOTE — Clinical Social Work Psychosocial (Signed)
     Clinical Social Work Department BRIEF PSYCHOSOCIAL ASSESSMENT 06/08/2012  Patient:  Marissa Barrett, Marissa Barrett     Account Number:  [id number]     Admit date:  06/04/2012  Clinical Social Worker:  Hulan Fray  Date/Time:  06/08/2012 03:45 PM  Referred by:  RN  Date Referred:  06/05/2012 Referred for  Advanced Directives  Other - See comment   Other Referral:   "Ambulance for [facility] transfer later this afternoon."   Interview type:  Patient Other interview type:    PSYCHOSOCIAL DATA Living Status:  HUSBAND Admitted from facility:   Level of care:   Primary support name:  Gentry Roch Primary support relationship to patient:  SPOUSE Degree of support available:   adequate    CURRENT CONCERNS Current Concerns  None Noted   Other Concerns:    SOCIAL WORK ASSESSMENT / PLAN Clinical Social Worker received multiple referrals regarding advance directive request and transportation to [facility] CSW introduced self and explained reason for visit. Patient had family members at bedside. CSW inquired about advance directive request and patient was agreeable to accept packet. CSW provided advance directive packet and MOST form. CSW explained the forms, living will and healthcare poa. CSW explained forms can be notarized during hospital admission and in the community.  CSW's referral for transportation to [facility] was inappropriate, as the unit RN is to complete COBRA form for carelink to transport patient to [facility] as an acute-to-acute transfer. CSW will sign off, as social work intervention is no longer needed.   Assessment/plan status:  Information/Referral to [facility] Other assessment/ plan:   Information/referral to community resources:   Advance directive packet  MOST form    PATIENTS/FAMILYS RESPONSE TO PLAN OF CARE: Patient was agreeable to receiving advance directive packet and MOST form. Patient was appreciative of CSW's visit.

## 2012-06-09 NOTE — Discharge Summary (Signed)
Internal Medicine Teaching Service Attending Note Date: 06/09/2012  Patient name: Marissa Barrett  Medical record number: [id number]  Date of birth: 1976-01-11    I evaluated the patient on the day of discharge and discussed the discharge plan with my resident team. My team contacted [facility] for specialist services in Gastroparesis and the patient was accepted. We discussed this with the patient. We decided to transfer her via ambulance from acute care to acute care.   I agree with the discharge documentation and disposition.  Thank you for working with me on this patient's care.   Thanks Aletta Edouard 06/09/2012, 12:50 PM

## 2012-07-06 ENCOUNTER — Encounter (HOSPITAL_COMMUNITY): Payer: Self-pay | Admitting: *Deleted

## 2012-07-06 ENCOUNTER — Emergency Department (HOSPITAL_COMMUNITY)
Admission: EM | Admit: 2012-07-06 | Discharge: 2012-07-07 | Disposition: A | Discharge status: Home / Self Care | Payer: Medicaid Other | Attending: Emergency Medicine | Admitting: Emergency Medicine

## 2012-07-06 DIAGNOSIS — Z8679 Personal history of other diseases of the circulatory system: Secondary | ICD-10-CM | POA: Insufficient documentation

## 2012-07-06 DIAGNOSIS — K5900 Constipation, unspecified: Secondary | ICD-10-CM | POA: Insufficient documentation

## 2012-07-06 DIAGNOSIS — Z8742 Personal history of other diseases of the female genital tract: Secondary | ICD-10-CM | POA: Insufficient documentation

## 2012-07-06 DIAGNOSIS — R1110 Vomiting, unspecified: Secondary | ICD-10-CM

## 2012-07-06 DIAGNOSIS — Z8659 Personal history of other mental and behavioral disorders: Secondary | ICD-10-CM | POA: Insufficient documentation

## 2012-07-06 DIAGNOSIS — Z79899 Other long term (current) drug therapy: Secondary | ICD-10-CM | POA: Insufficient documentation

## 2012-07-06 DIAGNOSIS — R1013 Epigastric pain: Secondary | ICD-10-CM | POA: Insufficient documentation

## 2012-07-06 DIAGNOSIS — E1149 Type 2 diabetes mellitus with other diabetic neurological complication: Secondary | ICD-10-CM | POA: Insufficient documentation

## 2012-07-06 DIAGNOSIS — J45909 Unspecified asthma, uncomplicated: Secondary | ICD-10-CM | POA: Insufficient documentation

## 2012-07-06 DIAGNOSIS — E785 Hyperlipidemia, unspecified: Secondary | ICD-10-CM | POA: Insufficient documentation

## 2012-07-06 DIAGNOSIS — K3184 Gastroparesis: Secondary | ICD-10-CM

## 2012-07-06 DIAGNOSIS — Z794 Long term (current) use of insulin: Secondary | ICD-10-CM | POA: Insufficient documentation

## 2012-07-06 DIAGNOSIS — R112 Nausea with vomiting, unspecified: Secondary | ICD-10-CM | POA: Insufficient documentation

## 2012-07-06 DIAGNOSIS — Z8719 Personal history of other diseases of the digestive system: Secondary | ICD-10-CM | POA: Insufficient documentation

## 2012-07-06 LAB — COMPREHENSIVE METABOLIC PANEL
ALT: 12 U/L (ref 0–35)
AST: 9 U/L (ref 0–37)
CO2: 22 mEq/L (ref 19–32)
Calcium: 9.7 mg/dL (ref 8.4–10.5)
Sodium: 133 mEq/L — ABNORMAL LOW (ref 135–145)
Total Protein: 7.8 g/dL (ref 6.0–8.3)

## 2012-07-06 LAB — GLUCOSE, CAPILLARY

## 2012-07-06 LAB — CBC WITH DIFFERENTIAL/PLATELET
Basophils Absolute: 0 10*3/uL (ref 0.0–0.1)
Eosinophils Absolute: 0.1 10*3/uL (ref 0.0–0.7)
Eosinophils Relative: 1 % (ref 0–5)
Lymphocytes Relative: 41 % (ref 12–46)
MCV: 77.9 fL — ABNORMAL LOW (ref 78.0–100.0)
Neutrophils Relative %: 53 % (ref 43–77)
Platelets: 372 10*3/uL (ref 150–400)
RDW: 12.6 % (ref 11.5–15.5)
WBC: 5.8 10*3/uL (ref 4.0–10.5)

## 2012-07-06 MED ORDER — SODIUM CHLORIDE 0.9 % IV SOLN
Freq: Once | INTRAVENOUS | Status: AC
  Administered 2012-07-06: 22:00:00 via INTRAVENOUS

## 2012-07-06 MED ORDER — LORAZEPAM 2 MG/ML IJ SOLN
1.0000 mg | Freq: Once | INTRAMUSCULAR | Status: AC
  Administered 2012-07-06: 1 mg via INTRAVENOUS
  Filled 2012-07-06: qty 1

## 2012-07-06 MED ORDER — SODIUM CHLORIDE 0.9 % IV BOLUS (SEPSIS)
1000.0000 mL | Freq: Once | INTRAVENOUS | Status: AC
  Administered 2012-07-06: 1000 mL via INTRAVENOUS

## 2012-07-06 MED ORDER — MORPHINE SULFATE 4 MG/ML IJ SOLN
4.0000 mg | Freq: Once | INTRAMUSCULAR | Status: AC
  Administered 2012-07-06: 4 mg via INTRAVENOUS
  Filled 2012-07-06: qty 1

## 2012-07-06 MED ORDER — METOCLOPRAMIDE HCL 5 MG/ML IJ SOLN
10.0000 mg | Freq: Once | INTRAMUSCULAR | Status: AC
  Administered 2012-07-06: 10 mg via INTRAVENOUS
  Filled 2012-07-06: qty 2

## 2012-07-06 NOTE — ED Notes (Signed)
379 CBG in triage. RN notified.

## 2012-07-06 NOTE — ED Notes (Signed)
PT HAS BEEN TREATED FOR GASTROPARESIS WITH RECENT DISCHARGE.  JUST STARTED BACK VOMITING ON fRIDAY AND NOW CANNOT KEEP ANYTHING DOWN.

## 2012-07-06 NOTE — ED Notes (Signed)
Pt reports dx w/Gastroparesis in August, is seeing a specialist in [place] but is unable to complete the needed test to give a confirmative dx d/t excessive vomiting. Pt reports chronic upper abd pain w/intense pain and N/V x2 days. Pt and mother both are tearful, pt sts. "I'm just so tired of all this."

## 2012-07-06 NOTE — ED Notes (Signed)
Pt states she is unable to void at the present time. Pt does know that an urine specimen is needed.

## 2012-07-06 NOTE — ED Provider Notes (Signed)
History     CSN: [id number]  Arrival date & time 07/06/12  1613   First MD Initiated Contact with Patient 07/06/12 2048      Chief Complaint  Patient presents with  . Abdominal Pain  . Emesis    (Consider location/radiation/quality/duration/timing/severity/associated sxs/prior treatment) HPI Comments: Patient is an insulin-dependent diabetic, who has a history of gastroparesis.  She was recently.admitted to [facility] on December 10 and discharged on December 13.  She was assigned a new physician in [place], but she has not seen as of yet.  She reports, that on Friday, just 3, days, ago.  She started having similar symptoms, with nausea and vomiting.  Yesterday.  She vomited nonstop, as well as today.  She had her last Phenergan suppository yesterday.  She did not contact her physician at [facility].  She now ulcers.  Reporting upper abdominal discomfort, stating it feels swollen in the epigastric area.  Her blood sugars have been running between 250 and 350  Patient is a 36 y.o. female presenting with abdominal pain and vomiting. The history is provided by the patient.  Abdominal Pain The primary symptoms of the illness include abdominal pain, nausea and vomiting. The primary symptoms of the illness do not include fever, shortness of breath, diarrhea, dysuria, vaginal discharge or vaginal bleeding. The current episode started more than 2 days ago.  Additional symptoms associated with the illness include constipation. Symptoms associated with the illness do not include chills.  Emesis  Associated symptoms include abdominal pain. Pertinent negatives include no chills, no cough, no diarrhea and no fever.    Past Medical History  Diagnosis Date  . Interstitial cystitis   . Asthma   . HLD (hyperlipidemia)   . Gastritis     mild per EGD (04/2012)  . Gastroparesis     presumed diagnosis - unable to tolerate gastric emptying study.  . Exertional dyspnea   . Type II diabetes mellitus  2006    Requiring insulin  . History of blood transfusion 2003  . GERD (gastroesophageal reflux disease)   . Migraines   . Daily headache   . Anxiety     Past Surgical History  Procedure Date  . Esophagogastroduodenoscopy 05/09/2012    Procedure: ESOPHAGOGASTRODUODENOSCOPY (EGD);  Surgeon: Charna Elizabeth, MD;  Location: [facility] ENDOSCOPY;  Service: Endoscopy;  Laterality: N/A;  . Abdominal exploration surgery 2007    "related to interstitial cystitis" (06/05/2012)  . Abdominal hysterectomy 2004    Family History  Problem Relation Age of Onset  . Sarcoidosis Mother   . Hypertension Mother   . Diabetes Mother   . Cerebral aneurysm Maternal Grandmother     History  Substance Use Topics  . Smoking status: Never Smoker   . Smokeless tobacco: Never Used  . Alcohol Use: No     Comment: rarely    OB History    Grav Para Term Preterm Abortions TAB SAB Ect Mult Living                  Review of Systems  Constitutional: Positive for appetite change. Negative for fever and chills.  HENT: Negative.   Eyes: Negative.   Respiratory: Negative for cough and shortness of breath.   Cardiovascular: Negative.   Gastrointestinal: Positive for nausea, vomiting, abdominal pain and constipation. Negative for diarrhea.  Genitourinary: Negative for dysuria, vaginal bleeding and vaginal discharge.  Musculoskeletal: Negative.   Skin: Negative for rash and wound.  Neurological: Negative for dizziness and weakness.    Allergies  Kiwi extract  Home Medications   Current Outpatient Rx  Name  Route  Sig  Dispense  Refill  . ALBUTEROL SULFATE HFA 108 (90 BASE) MCG/ACT IN AERS   Inhalation   Inhale 2 puffs into the lungs every 6 (six) hours as needed. Wheezing or shortness of breath         . DICYCLOMINE HCL 20 MG PO TABS   Oral   Take 20 mg by mouth 3 (three) times daily as needed. For stomach pain         . INSULIN GLARGINE 100 UNIT/ML [place] SOLN   Subcutaneous   Inject 25 Units into the  skin at bedtime.         . INSULIN LISPRO (HUMAN) 100 UNIT/ML [place] SOLN   Subcutaneous   Inject 2-10 Units into the skin 3 (three) times daily before meals. Sliding scale as directed         . METOCLOPRAMIDE HCL 10 MG PO TABS   Oral   Take 10 mg by mouth every 6 (six) hours as needed. For stomach upset         . TRAMADOL HCL 50 MG PO TABS   Oral   Take 50-100 mg by mouth every 8 (eight) hours as needed. For pain         . HYDROCODONE-ACETAMINOPHEN 5-325 MG PO TABS   Oral   Take 1 tablet by mouth every 6 (six) hours as needed for pain.   12 tablet   0   . PROMETHAZINE HCL 25 MG RE SUPP   Rectal   Place 1 suppository (25 mg total) rectally every 6 (six) hours as needed for nausea.   20 each   0   . PROMETHAZINE HCL 25 MG PO TABS   Oral   Take 1 tablet (25 mg total) by mouth every 6 (six) hours as needed for nausea.   15 tablet   0     BP 122/80  Pulse 114  Temp 98.8 F (37.1 C) (Oral)  Resp 16  SpO2 98%  LMP 02/15/2003  Physical Exam  Constitutional: She is oriented to person, place, and time. She appears well-developed and well-nourished. She appears distressed.  Eyes: Pupils are equal, round, and reactive to light.  Cardiovascular: Regular rhythm.  Tachycardia present.   Pulmonary/Chest: Effort normal and breath sounds normal.  Abdominal: Soft. Bowel sounds are normal. There is tenderness in the epigastric area.    Musculoskeletal: Normal range of motion.  Neurological: She is alert and oriented to person, place, and time.  Skin: Skin is warm.  Psychiatric: Her mood appears anxious. Her affect is angry.    ED Course  Procedures (including critical care time)  Labs Reviewed  CBC WITH DIFFERENTIAL - Abnormal; Notable for the following:    RBC 5.33 (*)     MCV 77.9 (*)     All other components within normal limits  COMPREHENSIVE METABOLIC PANEL - Abnormal; Notable for the following:    Sodium 133 (*)     Glucose, Bld 374 (*)     Creatinine, Ser  0.35 (*)     All other components within normal limits  LIPASE, BLOOD - Abnormal; Notable for the following:    Lipase 73 (*)     All other components within normal limits  GLUCOSE, CAPILLARY - Abnormal; Notable for the following:    Glucose-Capillary 379 (*)     All other components within normal limits  GLUCOSE, CAPILLARY - Abnormal; Notable for  the following:    Glucose-Capillary 290 (*)     All other components within normal limits  URINALYSIS, ROUTINE W REFLEX MICROSCOPIC   No results found.   1. Vomiting alone   2. Diabetic gastroparesis associated with type 2 diabetes mellitus       MDM   Will hydrate, an attempt to control her symptoms.  Labs reveal a slightly low sodium, elevated glucose Patient is no longer vomiting.  I discussed her slightly elevated.  Glucose level of to 90 at time of discharge.  She will take her nighttime Lantus insulin when she gets home to call her new primary care physician to set an appointment for as soon as possible       Arman Filter, NP 07/07/12 [phone]

## 2012-07-07 MED ORDER — HYDROCODONE-ACETAMINOPHEN 5-325 MG PO TABS: 1.0000 | ORAL_TABLET | Freq: Four times a day (QID) | ORAL | Status: DC | PRN

## 2012-07-07 MED ORDER — PROMETHAZINE HCL 25 MG PO TABS: 25.0000 mg | ORAL_TABLET | Freq: Four times a day (QID) | ORAL | Status: DC | PRN

## 2012-07-07 MED ORDER — PROMETHAZINE HCL 25 MG RE SUPP: 25.0000 mg | Freq: Four times a day (QID) | RECTAL | Status: DC | PRN

## 2012-07-07 NOTE — ED Provider Notes (Signed)
Medical screening examination/treatment/procedure(s) were performed by non-physician practitioner and as supervising physician I was immediately available for consultation/collaboration.   Carleene Cooper III, MD 07/07/12 [phone]

## 2012-07-07 NOTE — ED Notes (Addendum)
Pt tolerated PO challenge w/o vomiting or becoming nauseated

## 2012-07-09 ENCOUNTER — Inpatient Hospital Stay (HOSPITAL_COMMUNITY)
Admission: EM | Admit: 2012-07-09 | Discharge: 2012-07-10 | DRG: 639 | Disposition: A | Discharge status: Home / Self Care | Payer: Medicaid Other | Attending: Internal Medicine | Admitting: Internal Medicine

## 2012-07-09 ENCOUNTER — Observation Stay (HOSPITAL_COMMUNITY): Payer: Medicaid Other

## 2012-07-09 ENCOUNTER — Encounter (HOSPITAL_COMMUNITY): Payer: Self-pay | Admitting: Adult Health

## 2012-07-09 DIAGNOSIS — K3184 Gastroparesis: Secondary | ICD-10-CM | POA: Diagnosis present

## 2012-07-09 DIAGNOSIS — R112 Nausea with vomiting, unspecified: Secondary | ICD-10-CM

## 2012-07-09 DIAGNOSIS — K3189 Other diseases of stomach and duodenum: Secondary | ICD-10-CM | POA: Diagnosis present

## 2012-07-09 DIAGNOSIS — E1165 Type 2 diabetes mellitus with hyperglycemia: Secondary | ICD-10-CM | POA: Diagnosis present

## 2012-07-09 DIAGNOSIS — K859 Acute pancreatitis without necrosis or infection, unspecified: Secondary | ICD-10-CM | POA: Diagnosis present

## 2012-07-09 DIAGNOSIS — IMO0002 Reserved for concepts with insufficient information to code with codable children: Secondary | ICD-10-CM | POA: Diagnosis present

## 2012-07-09 DIAGNOSIS — IMO0001 Reserved for inherently not codable concepts without codable children: Principal | ICD-10-CM | POA: Diagnosis present

## 2012-07-09 DIAGNOSIS — F411 Generalized anxiety disorder: Secondary | ICD-10-CM | POA: Diagnosis present

## 2012-07-09 DIAGNOSIS — E876 Hypokalemia: Secondary | ICD-10-CM | POA: Diagnosis present

## 2012-07-09 DIAGNOSIS — Z794 Long term (current) use of insulin: Secondary | ICD-10-CM

## 2012-07-09 DIAGNOSIS — E785 Hyperlipidemia, unspecified: Secondary | ICD-10-CM | POA: Diagnosis present

## 2012-07-09 DIAGNOSIS — K219 Gastro-esophageal reflux disease without esophagitis: Secondary | ICD-10-CM | POA: Diagnosis present

## 2012-07-09 DIAGNOSIS — E1143 Type 2 diabetes mellitus with diabetic autonomic (poly)neuropathy: Secondary | ICD-10-CM | POA: Diagnosis present

## 2012-07-09 DIAGNOSIS — N3010 Interstitial cystitis (chronic) without hematuria: Secondary | ICD-10-CM | POA: Diagnosis present

## 2012-07-09 DIAGNOSIS — R1115 Cyclical vomiting syndrome unrelated to migraine: Secondary | ICD-10-CM

## 2012-07-09 DIAGNOSIS — R1013 Epigastric pain: Secondary | ICD-10-CM | POA: Diagnosis present

## 2012-07-09 DIAGNOSIS — R739 Hyperglycemia, unspecified: Secondary | ICD-10-CM | POA: Diagnosis present

## 2012-07-09 LAB — COMPREHENSIVE METABOLIC PANEL
Alkaline Phosphatase: 87 U/L (ref 39–117)
BUN: 9 mg/dL (ref 6–23)
CO2: 23 mEq/L (ref 19–32)
Chloride: 96 mEq/L (ref 96–112)
Creatinine, Ser: 0.37 mg/dL — ABNORMAL LOW (ref 0.50–1.10)
GFR calc Af Amer: >90 mL/min (ref >90)
GFR calc non Af Amer: >90 mL/min (ref >90)
Glucose, Bld: 298 mg/dL — ABNORMAL HIGH (ref 70–99)
Potassium: 3.5 mEq/L (ref 3.5–5.1)
Total Bilirubin: 0.4 mg/dL (ref 0.3–1.2)

## 2012-07-09 LAB — GLUCOSE, CAPILLARY
Glucose-Capillary: 152 mg/dL — ABNORMAL HIGH (ref 70–99)
Glucose-Capillary: 174 mg/dL — ABNORMAL HIGH (ref 70–99)
Glucose-Capillary: 271 mg/dL — ABNORMAL HIGH (ref 70–99)
Glucose-Capillary: 290 mg/dL — ABNORMAL HIGH (ref 70–99)

## 2012-07-09 LAB — CBC WITH DIFFERENTIAL/PLATELET
Basophils Relative: 0 % (ref 0–1)
HCT: 41 % (ref 36.0–46.0)
Hemoglobin: 14.4 g/dL (ref 12.0–15.0)
Lymphocytes Relative: 29 % (ref 12–46)
Lymphs Abs: 2 10*3/uL (ref 0.7–4.0)
MCHC: 35.1 g/dL (ref 30.0–36.0)
Monocytes Absolute: 0.4 10*3/uL (ref 0.1–1.0)
Monocytes Relative: 5 % (ref 3–12)
Neutro Abs: 4.7 10*3/uL (ref 1.7–7.7)
Neutrophils Relative %: 66 % (ref 43–77)
RBC: 5.28 MIL/uL — ABNORMAL HIGH (ref 3.87–5.11)

## 2012-07-09 LAB — LIPASE, BLOOD: Lipase: 62 U/L — ABNORMAL HIGH (ref 11–59)

## 2012-07-09 MED ORDER — TRAMADOL HCL 50 MG PO TABS
50.0000 mg | ORAL_TABLET | Freq: Four times a day (QID) | ORAL | Status: DC
  Administered 2012-07-09 – 2012-07-10 (×5): 50 mg via ORAL
  Filled 2012-07-09 (×7): qty 1

## 2012-07-09 MED ORDER — SODIUM CHLORIDE 0.9 % IV SOLN: INTRAVENOUS | Status: AC

## 2012-07-09 MED ORDER — METOCLOPRAMIDE HCL 5 MG/5ML PO SOLN: 10.0000 mg | Freq: Three times a day (TID) | ORAL | Status: DC

## 2012-07-09 MED ORDER — HYDROMORPHONE HCL PF 1 MG/ML IJ SOLN
0.5000 mg | INTRAMUSCULAR | Status: DC | PRN
  Administered 2012-07-09: 1 mg via INTRAVENOUS
  Filled 2012-07-09: qty 1

## 2012-07-09 MED ORDER — METOCLOPRAMIDE HCL 5 MG/ML IJ SOLN
10.0000 mg | Freq: Four times a day (QID) | INTRAMUSCULAR | Status: DC
  Administered 2012-07-10 (×4): 10 mg via INTRAVENOUS
  Filled 2012-07-09 (×5): qty 2

## 2012-07-09 MED ORDER — ACETAMINOPHEN 325 MG PO TABS
650.0000 mg | ORAL_TABLET | Freq: Four times a day (QID) | ORAL | Status: DC | PRN
  Administered 2012-07-09: 650 mg via ORAL
  Filled 2012-07-09: qty 2

## 2012-07-09 MED ORDER — ZOLPIDEM TARTRATE 5 MG PO TABS
5.0000 mg | ORAL_TABLET | Freq: Every evening | ORAL | Status: DC | PRN
  Administered 2012-07-09: 5 mg via ORAL
  Filled 2012-07-09: qty 1

## 2012-07-09 MED ORDER — ONDANSETRON HCL 4 MG/2ML IJ SOLN
4.0000 mg | Freq: Once | INTRAMUSCULAR | Status: AC
  Administered 2012-07-09: 4 mg via INTRAVENOUS
  Filled 2012-07-09: qty 2

## 2012-07-09 MED ORDER — HYDROMORPHONE HCL PF 1 MG/ML IJ SOLN: 1.0000 mg | INTRAMUSCULAR | Status: DC | PRN

## 2012-07-09 MED ORDER — INSULIN DETEMIR 100 UNIT/ML ~~LOC~~ SOLN
25.0000 [IU] | Freq: Every day | SUBCUTANEOUS | Status: DC
  Administered 2012-07-09 – 2012-07-10 (×2): 25 [IU] via SUBCUTANEOUS
  Filled 2012-07-09: qty 10

## 2012-07-09 MED ORDER — TECHNETIUM TC 99M SULFUR COLLOID
2.0000 | Freq: Once | INTRAVENOUS | Status: AC | PRN
  Administered 2012-07-09: 2 via ORAL

## 2012-07-09 MED ORDER — BIOTENE DRY MOUTH MT LIQD
15.0000 mL | Freq: Two times a day (BID) | OROMUCOSAL | Status: DC
  Administered 2012-07-09 – 2012-07-10 (×2): 15 mL via OROMUCOSAL

## 2012-07-09 MED ORDER — ENOXAPARIN SODIUM 40 MG/0.4ML ~~LOC~~ SOLN
40.0000 mg | SUBCUTANEOUS | Status: DC
  Administered 2012-07-09: 40 mg via SUBCUTANEOUS
  Filled 2012-07-09 (×2): qty 0.4

## 2012-07-09 MED ORDER — ONDANSETRON HCL 4 MG PO TABS
4.0000 mg | ORAL_TABLET | Freq: Four times a day (QID) | ORAL | Status: DC | PRN
  Administered 2012-07-10: 4 mg via ORAL
  Filled 2012-07-09: qty 1

## 2012-07-09 MED ORDER — HYDROMORPHONE HCL PF 1 MG/ML IJ SOLN
1.0000 mg | Freq: Once | INTRAMUSCULAR | Status: AC
  Administered 2012-07-09: 1 mg via INTRAVENOUS
  Filled 2012-07-09: qty 1

## 2012-07-09 MED ORDER — ONDANSETRON HCL 4 MG/2ML IJ SOLN
4.0000 mg | Freq: Four times a day (QID) | INTRAMUSCULAR | Status: DC | PRN
  Administered 2012-07-09: 4 mg via INTRAVENOUS
  Filled 2012-07-09: qty 2

## 2012-07-09 MED ORDER — SODIUM CHLORIDE 0.9 % IV BOLUS (SEPSIS)
1000.0000 mL | Freq: Once | INTRAVENOUS | Status: AC
  Administered 2012-07-09: 1000 mL via INTRAVENOUS

## 2012-07-09 MED ORDER — ONDANSETRON 4 MG PO TBDP
8.0000 mg | ORAL_TABLET | Freq: Once | ORAL | Status: AC
  Administered 2012-07-09: 8 mg via ORAL
  Filled 2012-07-09: qty 2

## 2012-07-09 MED ORDER — INSULIN ASPART 100 UNIT/ML ~~LOC~~ SOLN
0.0000 [IU] | SUBCUTANEOUS | Status: DC
  Administered 2012-07-09: 5 [IU] via SUBCUTANEOUS
  Administered 2012-07-09: 8 [IU] via SUBCUTANEOUS
  Administered 2012-07-09 – 2012-07-10 (×6): 3 [IU] via SUBCUTANEOUS
  Administered 2012-07-10: 2 [IU] via SUBCUTANEOUS

## 2012-07-09 MED ORDER — ONDANSETRON HCL 4 MG/2ML IJ SOLN: 4.0000 mg | Freq: Three times a day (TID) | INTRAMUSCULAR | Status: DC | PRN

## 2012-07-09 MED ORDER — SODIUM CHLORIDE 0.9 % IV SOLN
INTRAVENOUS | Status: DC
  Administered 2012-07-09: 1000 mL via INTRAVENOUS
  Administered 2012-07-09: 06:00:00 via INTRAVENOUS
  Administered 2012-07-10: 100 mL/h via INTRAVENOUS

## 2012-07-09 MED ORDER — LORAZEPAM 2 MG/ML IJ SOLN
1.0000 mg | Freq: Once | INTRAMUSCULAR | Status: AC
  Administered 2012-07-09: 1 mg via INTRAVENOUS
  Filled 2012-07-09: qty 1

## 2012-07-09 MED ORDER — OXYCODONE HCL 5 MG PO TABS
5.0000 mg | ORAL_TABLET | ORAL | Status: DC | PRN
  Administered 2012-07-09: 5 mg via ORAL
  Filled 2012-07-09: qty 1

## 2012-07-09 MED ORDER — ACETAMINOPHEN 650 MG RE SUPP: 650.0000 mg | Freq: Four times a day (QID) | RECTAL | Status: DC | PRN

## 2012-07-09 NOTE — Progress Notes (Signed)
Patient wants stronger pain medication.  Feels ultram is not strong enough.

## 2012-07-09 NOTE — ED Provider Notes (Signed)
History     CSN: [id number]  Arrival date & time 07/09/12  0037   First MD Initiated Contact with Patient 07/09/12 0121      Chief Complaint  Patient presents with  . Abdominal Pain    (Consider location/radiation/quality/duration/timing/severity/associated sxs/prior treatment) Patient is a 36 y.o. female presenting with abdominal pain. The history is provided by the patient.  Abdominal Pain The primary symptoms of the illness include abdominal pain, nausea and vomiting. Primary symptoms comment: pt with hx of gastroparesis and cylical vomiting,  seen 3 days ago,  admitted to [facility] 12/11 The current episode started more than 2 days ago. The onset of the illness was gradual. The problem has not changed since onset. The abdominal pain began yesterday. The pain came on gradually. The abdominal pain is generalized. The abdominal pain does not radiate. The severity of the abdominal pain is 10/10. The abdominal pain is relieved by nothing. The abdominal pain is exacerbated by vomiting.  Nausea began more than 1 week ago.  The vomiting began more than 2 days ago. Vomiting occurs more than 10 times per day. The emesis contains stomach contents.  The patient states that she believes she is currently not pregnant. The patient has not had a change in bowel habit.    Past Medical History  Diagnosis Date  . Interstitial cystitis   . Asthma   . HLD (hyperlipidemia)   . Gastritis     mild per EGD (04/2012)  . Gastroparesis     presumed diagnosis - unable to tolerate gastric emptying study.  . Exertional dyspnea   . Type II diabetes mellitus 2006    Requiring insulin  . History of blood transfusion 2003  . GERD (gastroesophageal reflux disease)   . Migraines   . Daily headache   . Anxiety     Past Surgical History  Procedure Date  . Esophagogastroduodenoscopy 05/09/2012    Procedure: ESOPHAGOGASTRODUODENOSCOPY (EGD);  Surgeon: Charna Elizabeth, MD;  Location: [facility] ENDOSCOPY;  Service:  Endoscopy;  Laterality: N/A;  . Abdominal exploration surgery 2007    "related to interstitial cystitis" (06/05/2012)  . Abdominal hysterectomy 2004    Family History  Problem Relation Age of Onset  . Sarcoidosis Mother   . Hypertension Mother   . Diabetes Mother   . Cerebral aneurysm Maternal Grandmother     History  Substance Use Topics  . Smoking status: Never Smoker   . Smokeless tobacco: Never Used  . Alcohol Use: No     Comment: rarely    OB History    Grav Para Term Preterm Abortions TAB SAB Ect Mult Living                  Review of Systems  Gastrointestinal: Positive for nausea, vomiting and abdominal pain.  All other systems reviewed and are negative.    Allergies  Kiwi extract  Home Medications   Current Outpatient Rx  Name  Route  Sig  Dispense  Refill  . ALBUTEROL SULFATE HFA 108 (90 BASE) MCG/ACT IN AERS   Inhalation   Inhale 2 puffs into the lungs every 6 (six) hours as needed. Wheezing or shortness of breath         . DICYCLOMINE HCL 20 MG PO TABS   Oral   Take 20 mg by mouth 3 (three) times daily as needed. For stomach pain         . HYDROCODONE-ACETAMINOPHEN 5-325 MG PO TABS   Oral   Take 1 tablet  by mouth every 6 (six) hours as needed for pain.   12 tablet   0   . INSULIN GLARGINE 100 UNIT/ML [place] SOLN   Subcutaneous   Inject 25 Units into the skin at bedtime.         . INSULIN LISPRO (HUMAN) 100 UNIT/ML [place] SOLN   Subcutaneous   Inject 2-10 Units into the skin 3 (three) times daily before meals. Sliding scale as directed           BP 147/105  Pulse 115  Temp 98.2 F (36.8 C) (Oral)  Resp 20  SpO2 98%  LMP 02/15/2003  Physical Exam  Constitutional: She is oriented to person, place, and time. She appears well-developed and well-nourished.  HENT:  Head: Normocephalic and atraumatic.  Eyes: Conjunctivae normal and EOM are normal. Pupils are equal, round, and reactive to light.  Neck: Normal range of motion.   Cardiovascular: Normal rate, regular rhythm and normal heart sounds.   Pulmonary/Chest: Effort normal and breath sounds normal.  Abdominal: Soft. Bowel sounds are normal.  Musculoskeletal: Normal range of motion.  Neurological: She is alert and oriented to person, place, and time.  Skin: Skin is warm and dry.  Psychiatric: She has a normal mood and affect. Her behavior is normal.    ED Course  Procedures (including critical care time)  Labs Reviewed  GLUCOSE, CAPILLARY - Abnormal; Notable for the following:    Glucose-Capillary 290 (*)     All other components within normal limits  CBC WITH DIFFERENTIAL - Abnormal; Notable for the following:    RBC 5.28 (*)     MCV 77.7 (*)     All other components within normal limits  COMPREHENSIVE METABOLIC PANEL - Abnormal; Notable for the following:    Sodium 134 (*)     Glucose, Bld 298 (*)     Creatinine, Ser 0.37 (*)     All other components within normal limits  LIPASE, BLOOD   No results found.   No diagnosis found.    MDM  + diabetes,  Gastroparesis.  Unable to hold down food and meds.  Will analgesia, antiemetic,  Ivf,  reassess        Rosanne Ashing, MD 07/09/12 [phone]

## 2012-07-09 NOTE — H&P (Signed)
[facility] History and Physical  Marissa Barrett RUE:[id number] DOB: 1976/02/01 DOA: 07/09/2012  Referring physician: EDP PCP: Default, Provider, MD  Specialists:   Chief Complaint: Severe Nausea and Vomiting  HPI: Marissa Barrett is a 36 y.o. female with Type II Diabetes who presents with complaints of severe nausea and vomiting and Epigastric ABD Pain X 3 days.  She was seen in the ED on 12/21 and returned tonight due to continued symptoms.        Review of Systems: The patient denies anorexia, fever, weight loss, vision loss, decreased hearing, hoarseness, chest pain, syncope, dyspnea on exertion, peripheral edema, balance deficits, hemoptysis, abdominal pain, melena, hematochezia, severe indigestion/heartburn, hematuria, incontinence, genital sores, muscle weakness, suspicious skin lesions, transient blindness, difficulty walking, depression, unusual weight change, abnormal bleeding, enlarged lymph nodes, angioedema, and breast masses.    Past Medical History  Diagnosis Date  . Interstitial cystitis   . Asthma   . HLD (hyperlipidemia)   . Gastritis     mild per EGD (04/2012)  . Gastroparesis     presumed diagnosis - unable to tolerate gastric emptying study.  . Exertional dyspnea   . Type II diabetes mellitus 2006    Requiring insulin  . History of blood transfusion 2003  . GERD (gastroesophageal reflux disease)   . Migraines   . Daily headache   . Anxiety    Past Surgical History  Procedure Date  . Esophagogastroduodenoscopy 05/09/2012    Procedure: ESOPHAGOGASTRODUODENOSCOPY (EGD);  Surgeon: Charna Elizabeth, MD;  Location: [facility] ENDOSCOPY;  Service: Endoscopy;  Laterality: N/A;  . Abdominal exploration surgery 2007    "related to interstitial cystitis" (06/05/2012)  . Abdominal hysterectomy 2004     Medications:  HOME MEDS: Prior to Admission medications   Medication Sig Start Date End Date Taking? Authorizing Provider  albuterol (PROVENTIL HFA;VENTOLIN HFA) 108 (90  BASE) MCG/ACT inhaler Inhale 2 puffs into the lungs every 6 (six) hours as needed. Wheezing or shortness of breath   Yes Historical Provider, MD  dicyclomine (BENTYL) 20 MG tablet Take 20 mg by mouth 3 (three) times daily as needed. For stomach pain   Yes Historical Provider, MD  HYDROcodone-acetaminophen (NORCO/VICODIN) 5-325 MG per tablet Take 1 tablet by mouth every 6 (six) hours as needed for pain. 07/07/12  Yes Arman Filter, NP  insulin glargine (LANTUS) 100 UNIT/ML injection Inject 25 Units into the skin at bedtime.   Yes Historical Provider, MD  insulin lispro (HUMALOG) 100 UNIT/ML injection Inject 2-10 Units into the skin 3 (three) times daily before meals. Sliding scale as directed   Yes Historical Provider, MD    Allergies:  Allergies  Allergen Reactions  . Kiwi Extract Anaphylaxis, Itching and Swelling    And tongue swelling    Social History:   reports that she has never smoked. She has never used smokeless tobacco. She reports that she does not drink alcohol or use illicit drugs.  Family History: Family History  Problem Relation Age of Onset  . Sarcoidosis Mother   . Hypertension Mother   . Diabetes Mother   . Cerebral aneurysm Maternal Grandmother      Physical Exam:  GEN:  Pleasant 36 year old Obese African American Female examined  and in no acute distress; cooperative with exam Filed Vitals:   07/09/12 0300 07/09/12 0315 07/09/12 0330 07/09/12 0445  BP: 134/86 134/87 123/90 143/94  Pulse: 100 101 105 110  Temp:    98.3 F (36.8 C)  TempSrc:    Oral  Resp:    18  Height:    5\' 4"  (1.626 m)  Weight:    88.5 kg (195 lb 1.7 oz)  SpO2: 97% 97% 96% 100%   Blood pressure 143/94, pulse 110, temperature 98.3 F (36.8 C), temperature source Oral, resp. rate 18, height 5\' 4"  (1.626 m), weight 88.5 kg (195 lb 1.7 oz), last menstrual period 02/15/2003, SpO2 100.00%. PSYCH: She is alert and oriented x4; does not appear anxious does not appear depressed; affect is  normal HEENT: Normocephalic and Atraumatic, Mucous membranes pink; PERRLA; EOM intact; Fundi:  Benign;  No scleral icterus, Nares: Patent, Oropharynx: Clear, Fair Dentition, Neck:  FROM, no cervical lymphadenopathy nor thyromegaly or carotid bruit; no JVD; Breasts:: Not examined CHEST WALL: No tenderness CHEST: Normal respiration, clear to auscultation bilaterally HEART: Regular rate and rhythm; no murmurs rubs or gallops BACK: No kyphosis or scoliosis; no CVA tenderness ABDOMEN: Positive Bowel Sounds, Obese, soft non-tender; no masses, no organomegaly. Rectal Exam: Not done EXTREMITIES: No bone or joint deformity; age-appropriate arthropathy of the hands and knees; no cyanosis, clubbing or edema; no ulcerations. Genitalia: not examined PULSES: 2+ and symmetric SKIN: Normal hydration no rash or ulceration CNS: Cranial nerves 2-12 grossly intact no focal neurologic deficit     Labs on Admission:  Basic Metabolic Panel:  Lab 07/09/12 1610 07/06/12 1627  NA 134* 133*  K 3.5 3.6  CL 96 96  CO2 23 22  GLUCOSE 298* 374*  BUN 9 9  CREATININE 0.37* 0.35*  CALCIUM 9.5 9.7  MG -- --  PHOS -- --   Liver Function Tests:  Lab 07/09/12 0042 07/06/12 1627  AST 10 9  ALT 15 12  ALKPHOS 87 92  BILITOT 0.4 0.4  PROT 7.6 7.8  ALBUMIN 3.7 3.6    Lab 07/09/12 0133 07/06/12 1627  LIPASE 62* 73*  AMYLASE -- --   No results found for this basename: AMMONIA:5 in the last 168 hours CBC:  Lab 07/09/12 0042 07/06/12 1627  WBC 7.1 5.8  NEUTROABS 4.7 3.0  HGB 14.4 14.6  HCT 41.0 41.5  MCV 77.7* 77.9*  PLT 335 372   Cardiac Enzymes: No results found for this basename: CKTOTAL:5,CKMB:5,CKMBINDEX:5,TROPONINI:5 in the last 168 hours  BNP (last 3 results) No results found for this basename: PROBNP:3 in the last 8760 hours CBG:  Lab 07/09/12 0444 07/09/12 0040 07/06/12 2355 07/06/12 1644  GLUCAP 271* 290* 290* 379*    Radiological Exams on Admission: No results found.  EKG:  Independently reviewed.   Assessment: Principal Problem:  *Intractable nausea and vomiting Active Problems:  Diabetic gastroparesis associated with type 2 diabetes mellitus  Acute pancreatitis  Epigastric pain  Hyperglycemia  DM2 (diabetes mellitus, type 2)  Hyperlipidemia  Interstitial cystitis   Plan:      Admit to Med/Surg Anti-Emetics PRN IVFs Clear Liquid Diet Order ABD [place] Study Reconcile Home medications DVT Porphylaxis  Code Status:  FULL CODE Family Communication:  Family at Bedside Disposition Plan:  Return to Home  Time spent:  55 Minutes  Ron Parker [facility] Pager [phone]  If 7PM-7AM, please contact night-coverage [email or web address] Password [facility] 07/09/2012, 5:47 AM

## 2012-07-09 NOTE — ED Notes (Signed)
Presents with vomiting x2 days, hx gastroparesis, c/o upper abdominal pain. Here Monday night for same. Denies diarrhea.

## 2012-07-09 NOTE — Progress Notes (Signed)
[facility] PROGRESS NOTE  Marissa Barrett ZOX:[id number] DOB: 09-16-1975 DOA: 07/09/2012 PCP: Default, Provider, MD  Assessment/Plan: Principal Problem:  *Intractable nausea and vomiting Active Problems:  Interstitial cystitis  DM2 (diabetes mellitus, type 2)  Hyperlipidemia  Diabetic gastroparesis associated with type 2 diabetes mellitus  Acute pancreatitis  Epigastric pain  Hyperglycemia    Intractable nausea vomiting, extensive history, see brief narrative below. GI will not be consulted. We'll attempt a gastric emptying study. The patient has been referred to Ignacia Marvel, M.D, would recommend followup in [facility] post discharge. Discontinue all narcotics. Resume Bentyl, Reglan, erythromycin  Uncontrolled diabetes, restart Levemir, continue SSI  Depression/anxiety basic contributing   factor need close outpatient followup    Code Status: full Family Communication: family updated about patient's clinical progress Disposition Plan:  As above    Brief narrative: ) Diabetic gastroparesis associated with type 2 diabetes mellitus  Patient presented with 2-3 months of intractable nausea and vomiting, as well as 30 lb weight loss. She has had extensive GI work up here and at [facility]. She was diagnosed with H pylor in [place] based on a stool study, and was treated with 10 days of abx/ppi therapy. In September, she had a negative ultrasound of her abdmonen and CT scan which was unremarkable. In October (22-27), she was admitted to [facility] with symptoms of nausea/vomiting. EGD at the time showed patchy gastritis. She was diagnosed presumptively with gastroparesis, but could not tolerate emptying study because she could not tolerate the "egg." She was dischargedon erythromycin TID, reglan TID, protonix daily, and Vicodin prn. Several days later (11/5-11/15), she presented to [facility] with the same symptoms. Repeat EGD was unchanged.  On this  admission, she was unable to tolerate any liquids or solids without emesis, despite therapy with IV reglan, bentyl and phenergan. She was treated with toradol and tramadol for her pain. She did have elevated lipase of 239 on admission, likely due to protracted N/V and less likely pancreatitis. GI was consulted, and agreed with diagnosis of DM gastroparesis. They recommended better control of DM, frequent small meals, and weaning of narcotics. They also recommended changing antidepressant (celexa) to one with less GI side effects.  On hospital day 4, patient remained unable to tolerate any po intake and still with severe abdominal pain. Dr. Alycia Rossetti was called to discuss case and appropriateness of referral for evaluation of gastric stimulation device.  In terms of her diabetes, she was continued on home levemir, but at lower dose in light of no po intake (15U at bedtime). Sliding scale insulin was also provided. Her last HbA1c one month prior was 12.3.   Consultants:  None  Procedures:  Gastric emptying study   No antibiotics  HPI/Subjective: Nausea and vomiting overnight  Objective: Filed Vitals:   07/09/12 0300 07/09/12 0315 07/09/12 0330 07/09/12 0445  BP: 134/86 134/87 123/90 143/94  Pulse: 100 101 105 110  Temp:    98.3 F (36.8 C)  TempSrc:    Oral  Resp:    18  Height:    5\' 4"  (1.626 m)  Weight:    88.5 kg (195 lb 1.7 oz)  SpO2: 97% 97% 96% 100%   No intake or output data in the 24 hours ending 07/09/12 0839  Exam:  HENT:  Head: Atraumatic.  Nose: Nose normal.  Mouth/Throat: Oropharynx is clear and moist.  Eyes: Conjunctivae are normal. Pupils are equal, round, and reactive to light. No scleral icterus.  Neck: Neck supple. No  tracheal deviation present.  Cardiovascular: Normal rate, regular rhythm, normal heart sounds and intact distal pulses.  Pulmonary/Chest: Effort normal and breath sounds normal. No respiratory distress.  Abdominal: Soft. Normal appearance and  bowel sounds are normal. She exhibits no distension. There is no tenderness.  Musculoskeletal: She exhibits no edema and no tenderness.  Neurological: She is alert. No cranial nerve deficit.    Data Reviewed: Basic Metabolic Panel:  Lab 07/09/12 2130 07/06/12 1627  NA 134* 133*  K 3.5 3.6  CL 96 96  CO2 23 22  GLUCOSE 298* 374*  BUN 9 9  CREATININE 0.37* 0.35*  CALCIUM 9.5 9.7  MG -- --  PHOS -- --    Liver Function Tests:  Lab 07/09/12 0042 07/06/12 1627  AST 10 9  ALT 15 12  ALKPHOS 87 92  BILITOT 0.4 0.4  PROT 7.6 7.8  ALBUMIN 3.7 3.6    Lab 07/09/12 0133 07/06/12 1627  LIPASE 62* 73*  AMYLASE -- --   No results found for this basename: AMMONIA:5 in the last 168 hours  CBC:  Lab 07/09/12 0042 07/06/12 1627  WBC 7.1 5.8  NEUTROABS 4.7 3.0  HGB 14.4 14.6  HCT 41.0 41.5  MCV 77.7* 77.9*  PLT 335 372    Cardiac Enzymes: No results found for this basename: CKTOTAL:5,CKMB:5,CKMBINDEX:5,TROPONINI:5 in the last 168 hours BNP (last 3 results) No results found for this basename: PROBNP:3 in the last 8760 hours   CBG:  Lab 07/09/12 0755 07/09/12 0444 07/09/12 0040 07/06/12 2355 07/06/12 1644  GLUCAP 278* 271* 290* 290* 379*    No results found for this or any previous visit (from the past 240 hour(s)).   Studies: No results found.  Scheduled Meds:   . sodium chloride   Intravenous STAT  . enoxaparin (LOVENOX) injection  40 mg Subcutaneous Q24H  . insulin aspart  0-15 Units Subcutaneous Q4H   Continuous Infusions:   . sodium chloride 100 mL/hr at 07/09/12 8657    Principal Problem:  *Intractable nausea and vomiting Active Problems:  Interstitial cystitis  DM2 (diabetes mellitus, type 2)  Hyperlipidemia  Diabetic gastroparesis associated with type 2 diabetes mellitus  Acute pancreatitis  Epigastric pain  Hyperglycemia    Time spent: 40 minutes   [facility]  [facility] Pager [phone]. If 8PM-8AM, please contact  night-coverage at [email or web address], password [facility] 07/09/2012, 8:39 AM  LOS: 0 days

## 2012-07-10 LAB — CBC
HCT: 39.5 % (ref 36.0–46.0)
MCV: 78.1 fL (ref 78.0–100.0)
RBC: 5.06 MIL/uL (ref 3.87–5.11)
RDW: 12.5 % (ref 11.5–15.5)
WBC: 6.1 10*3/uL (ref 4.0–10.5)

## 2012-07-10 LAB — GLUCOSE, CAPILLARY
Glucose-Capillary: 151 mg/dL — ABNORMAL HIGH (ref 70–99)
Glucose-Capillary: 142 mg/dL — ABNORMAL HIGH (ref 70–99)

## 2012-07-10 LAB — BASIC METABOLIC PANEL
CO2: 23 mEq/L (ref 19–32)
Chloride: 101 mEq/L (ref 96–112)
Creatinine, Ser: 0.39 mg/dL — ABNORMAL LOW (ref 0.50–1.10)
GFR calc Af Amer: >90 mL/min (ref >90)
Potassium: 2.9 mEq/L — ABNORMAL LOW (ref 3.5–5.1)
Sodium: 137 mEq/L (ref 135–145)

## 2012-07-10 MED ORDER — PROMETHAZINE HCL 25 MG PO TABS: 25.0000 mg | ORAL_TABLET | Freq: Four times a day (QID) | ORAL | Status: DC | PRN

## 2012-07-10 MED ORDER — TRAMADOL HCL 50 MG PO TABS: 50.0000 mg | ORAL_TABLET | Freq: Four times a day (QID) | ORAL | Status: DC

## 2012-07-10 MED ORDER — METOCLOPRAMIDE HCL 10 MG PO TABS: 10.0000 mg | ORAL_TABLET | Freq: Four times a day (QID) | ORAL | Status: DC

## 2012-07-10 MED ORDER — POTASSIUM CHLORIDE 10 MEQ/100ML IV SOLN
10.0000 meq | INTRAVENOUS | Status: AC
  Administered 2012-07-10 (×4): 10 meq via INTRAVENOUS
  Filled 2012-07-10: qty 400

## 2012-07-10 NOTE — Discharge Summary (Signed)
Physician Discharge Summary  Marissa Barrett MRN: [id number] DOB/AGE: 1975/09/16 36 y.o.  PCP: Default, Provider, MD   Admit date: 07/09/2012 Discharge date: 07/10/2012  Discharge Diagnoses:   :  *Intractable nausea and vomiting Active Problems:  Interstitial cystitis  DM2 (diabetes mellitus, type 2)  Hyperlipidemia  Diabetic gastroparesis associated with type 2 diabetes mellitus  Acute pancreatitis  Epigastric pain  Hyperglycemia     Medication List     As of 07/10/2012 10:18 AM    STOP taking these medications         HYDROcodone-acetaminophen 5-325 MG per tablet   Commonly known as: NORCO/VICODIN      TAKE these medications         albuterol 108 (90 BASE) MCG/ACT inhaler   Commonly known as: PROVENTIL HFA;VENTOLIN HFA   Inhale 2 puffs into the lungs every 6 (six) hours as needed. Wheezing or shortness of breath      dicyclomine 20 MG tablet   Commonly known as: BENTYL   Take 20 mg by mouth 3 (three) times daily as needed. For stomach pain      insulin glargine 100 UNIT/ML injection   Commonly known as: LANTUS   Inject 25 Units into the skin at bedtime.      insulin lispro 100 UNIT/ML injection   Commonly known as: HUMALOG   Inject 2-10 Units into the skin 3 (three) times daily before meals. Sliding scale as directed      metoCLOPramide 10 MG tablet   Commonly known as: REGLAN   Take 1 tablet (10 mg total) by mouth 4 (four) times daily.      promethazine 25 MG tablet   Commonly known as: PHENERGAN   Take 1 tablet (25 mg total) by mouth every 6 (six) hours as needed for nausea.      traMADol 50 MG tablet   Commonly known as: ULTRAM   Take 1 tablet (50 mg total) by mouth every 6 (six) hours.        Discharge Condition: Stable   Disposition: 01-Home or Self Care   Consults:     Significant Diagnostic Studies: Nm Gastric Emptying  07/09/2012  *RADIOLOGY REPORT*  Clinical Data:  Abdominal pain.  NUCLEAR MEDICINE GASTRIC EMPTYING SCAN   Technique:  After oral ingestion of radiolabeled meal, sequential abdominal images were obtained for 120 minutes.  Residual percentage of activity remaining within the stomach was calculated at 60 and 120 minutes.  Radiopharmaceutical:  2.0 mCi Tc-62m sulfur colloid.  Comparison:  None.  Findings: At 60 minutes 46% of the activity remained in the stomach.  At 120 minutes 1.0% of the activity remained in the stomach.  Normal is less than 30% at 120 minutes.  IMPRESSION: Normal solid phase gastric emptying study.   Original Report Authenticated By: Rudie Meyer, M.D.        Microbiology: No results found for this or any previous visit (from the past 240 hour(s)).   Labs: Results for orders placed during the hospital encounter of 07/09/12 (from the past 48 hour(s))  GLUCOSE, CAPILLARY     Status: Abnormal   Collection Time   07/09/12 12:40 AM      Component Value Range Comment   Glucose-Capillary 290 (*) 70 - 99 mg/dL   CBC WITH DIFFERENTIAL     Status: Abnormal   Collection Time   07/09/12 12:42 AM      Component Value Range Comment   WBC 7.1  4.0 - 10.5 K/uL  RBC 5.28 (*) 3.87 - 5.11 MIL/uL    Hemoglobin 14.4  12.0 - 15.0 g/dL    HCT 16.1  09.6 - 04.5 %    MCV 77.7 (*) 78.0 - 100.0 fL    MCH 27.3  26.0 - 34.0 pg    MCHC 35.1  30.0 - 36.0 g/dL    RDW 40.9  81.1 - 91.4 %    Platelets 335  150 - 400 K/uL    Neutrophils Relative 66  43 - 77 %    Neutro Abs 4.7  1.7 - 7.7 K/uL    Lymphocytes Relative 29  12 - 46 %    Lymphs Abs 2.0  0.7 - 4.0 K/uL    Monocytes Relative 5  3 - 12 %    Monocytes Absolute 0.4  0.1 - 1.0 K/uL    Eosinophils Relative 0  0 - 5 %    Eosinophils Absolute 0.0  0.0 - 0.7 K/uL    Basophils Relative 0  0 - 1 %    Basophils Absolute 0.0  0.0 - 0.1 K/uL   COMPREHENSIVE METABOLIC PANEL     Status: Abnormal   Collection Time   07/09/12 12:42 AM      Component Value Range Comment   Sodium 134 (*) 135 - 145 mEq/L    Potassium 3.5  3.5 - 5.1 mEq/L    Chloride  96  96 - 112 mEq/L    CO2 23  19 - 32 mEq/L    Glucose, Bld 298 (*) 70 - 99 mg/dL    BUN 9  6 - 23 mg/dL    Creatinine, Ser 7.82 (*) 0.50 - 1.10 mg/dL    Calcium 9.5  8.4 - 95.6 mg/dL    Total Protein 7.6  6.0 - 8.3 g/dL    Albumin 3.7  3.5 - 5.2 g/dL    AST 10  0 - 37 U/L    ALT 15  0 - 35 U/L    Alkaline Phosphatase 87  39 - 117 U/L    Total Bilirubin 0.4  0.3 - 1.2 mg/dL    GFR calc non Af Amer >90  >90 mL/min    GFR calc Af Amer >90  >90 mL/min   LIPASE, BLOOD     Status: Abnormal   Collection Time   07/09/12  1:33 AM      Component Value Range Comment   Lipase 62 (*) 11 - 59 U/L   GLUCOSE, CAPILLARY     Status: Abnormal   Collection Time   07/09/12  4:44 AM      Component Value Range Comment   Glucose-Capillary 271 (*) 70 - 99 mg/dL   GLUCOSE, CAPILLARY     Status: Abnormal   Collection Time   07/09/12  7:55 AM      Component Value Range Comment   Glucose-Capillary 278 (*) 70 - 99 mg/dL    Comment 1 Notify RN     GLUCOSE, CAPILLARY     Status: Abnormal   Collection Time   07/09/12  2:04 PM      Component Value Range Comment   Glucose-Capillary 160 (*) 70 - 99 mg/dL    Comment 1 Notify RN     GLUCOSE, CAPILLARY     Status: Abnormal   Collection Time   07/09/12  4:06 PM      Component Value Range Comment   Glucose-Capillary 174 (*) 70 - 99 mg/dL    Comment 1 Notify RN  GLUCOSE, CAPILLARY     Status: Abnormal   Collection Time   07/09/12  9:48 PM      Component Value Range Comment   Glucose-Capillary 157 (*) 70 - 99 mg/dL   GLUCOSE, CAPILLARY     Status: Abnormal   Collection Time   07/09/12 11:41 PM      Component Value Range Comment   Glucose-Capillary 152 (*) 70 - 99 mg/dL    Comment 1 Documented in Chart      Comment 2 Notify RN     GLUCOSE, CAPILLARY     Status: Abnormal   Collection Time   07/10/12  5:38 AM      Component Value Range Comment   Glucose-Capillary 151 (*) 70 - 99 mg/dL   BASIC METABOLIC PANEL     Status: Abnormal   Collection Time     07/10/12  7:00 AM      Component Value Range Comment   Sodium 137  135 - 145 mEq/L    Potassium 2.9 (*) 3.5 - 5.1 mEq/L    Chloride 101  96 - 112 mEq/L    CO2 23  19 - 32 mEq/L    Glucose, Bld 161 (*) 70 - 99 mg/dL    BUN 4 (*) 6 - 23 mg/dL    Creatinine, Ser 1.61 (*) 0.50 - 1.10 mg/dL    Calcium 8.9  8.4 - 09.6 mg/dL    GFR calc non Af Amer >90  >90 mL/min    GFR calc Af Amer >90  >90 mL/min   CBC     Status: Normal   Collection Time   07/10/12  7:00 AM      Component Value Range Comment   WBC 6.1  4.0 - 10.5 K/uL    RBC 5.06  3.87 - 5.11 MIL/uL    Hemoglobin 13.6  12.0 - 15.0 g/dL    HCT 04.5  40.9 - 81.1 %    MCV 78.1  78.0 - 100.0 fL    MCH 26.9  26.0 - 34.0 pg    MCHC 34.4  30.0 - 36.0 g/dL    RDW 91.4  78.2 - 95.6 %    Platelets 287  150 - 400 K/uL   GLUCOSE, CAPILLARY     Status: Abnormal   Collection Time   07/10/12  7:53 AM      Component Value Range Comment   Glucose-Capillary 142 (*) 70 - 99 mg/dL      HPI  36 year old female who presents with intractable nausea vomiting Patient presented with 2-3 months of intractable nausea and vomiting, as well as 30 lb weight loss. She has had extensive GI work up here and at [facility]. She was diagnosed with H pylor in [place] based on a stool study, and was treated with 10 days of abx/ppi therapy. In September, she had a negative ultrasound of her abdmonen and CT scan which was unremarkable. In October (22-27), she was admitted to [facility] with symptoms of nausea/vomiting. EGD at the time showed patchy gastritis. She was diagnosed presumptively with gastroparesis, but could not tolerate emptying study because she could not tolerate the "egg." She was dischargedon erythromycin TID, reglan TID, protonix daily, and Vicodin prn. Several days later (11/5-11/15), she presented to [facility] with the same symptoms. Repeat EGD was unchanged.  On prior admission, she was unable to tolerate any liquids or solids  without emesis, despite therapy with IV reglan, bentyl and phenergan. She was treated with  toradol and tramadol for her pain. She did have elevated lipase of 239 on admission, likely due to protracted N/V and less likely pancreatitis. GI was consulted, and agreed with diagnosis of DM gastroparesis. They recommended better control of DM, frequent small meals, and weaning of narcotics. They also recommended changing antidepressant (celexa) to one with less GI side effects.  She was recently admitted at [facility], where she complained of being unable to tolerate any po intake and still with severe abdominal pain. Dr. Alycia Rossetti at [facility], was called to discuss case and appropriateness of referral for evaluation of gastric stimulation device. She was transferred to [facility]. According to the patient nor invasive procedure was done at [facility]  Her last HbA1c one month prior was 12.3.      HOSPITAL COURSE Hospital course #1 recurrent nausea vomiting Extensive workup as documented above According to the patient she has had a recent EGD and [facility] as well as [place] [facility] showed patchy gastritis She had a gastric emptying study which was normal During this admission the patient Requesting narcotic medications All narcotics were discontinued and the patient was explained that this is guarded due to into her constipation and her recurrent nausea The patient was not on any Reglan or erythromycin at the time of the gastric emptying study I have recommended an outpatient workup The diet has been advanced Patient had one episode of nausea last night without vomiting since yesterday Patient is extremely unhappy and frustrated however the patient has had multiple GI consultations and therefore she was explained that she needs an outpatient followup with GI, no further inpatient workup is indicated  #2 hypokalemia repleted    Discharge Exam:  Blood pressure 127/86, pulse 98, temperature 98.1 F (36.7 C),  temperature source Oral, resp. rate 18, height 5\' 4"  (1.626 m), weight 88.5 kg (195 lb 1.7 oz), last menstrual period 02/15/2003, SpO2 95.00%.   PSYCH: She is alert and oriented x4; does not appear anxious does not appear depressed; affect is normal  HEENT: Normocephalic and Atraumatic, Mucous membranes pink; PERRLA; EOM intact; Fundi: Benign; No scleral icterus, Nares: Patent, Oropharynx: Clear, Fair Dentition, Neck: FROM, no cervical lymphadenopathy nor thyromegaly or carotid bruit; no JVD;  Breasts:: Not examined  CHEST WALL: No tenderness  CHEST: Normal respiration, clear to auscultation bilaterally  HEART: Regular rate and rhythm; no murmurs rubs or gallops  BACK: No kyphosis or scoliosis; no CVA tenderness  ABDOMEN: Positive Bowel Sounds, Obese, soft non-tender; no masses, no organomegaly.  Rectal Exam: Not done  EXTREMITIES: No bone or joint deformity; age-appropriate arthropathy of the hands and knees; no cyanosis, clubbing or edema; no ulcerations.  Genitalia: not examined  PULSES: 2+ and symmetric  SKIN: Normal hydration no rash or ulceration  CNS: Cranial nerves 2-12 grossly intact no focal neurologic deficit         Follow-up Information    Follow up with [facility], MD. In 2 weeks. (Please call [phone], for appointment)       Schedule an appointment as soon as possible for a visit in 2 weeks to follow up.         SignedRicharda Overlie 07/10/2012, 10:18 AM

## 2012-07-23 ENCOUNTER — Inpatient Hospital Stay (HOSPITAL_COMMUNITY)
Admission: EM | Admit: 2012-07-23 | Discharge: 2012-07-28 | DRG: 392 | Disposition: A | Discharge status: Home / Self Care | Payer: Medicaid Other | Attending: Internal Medicine | Admitting: Internal Medicine

## 2012-07-23 ENCOUNTER — Encounter (HOSPITAL_COMMUNITY): Payer: Self-pay | Admitting: Cardiology

## 2012-07-23 ENCOUNTER — Observation Stay (HOSPITAL_COMMUNITY): Payer: Medicaid Other

## 2012-07-23 DIAGNOSIS — E860 Dehydration: Secondary | ICD-10-CM | POA: Diagnosis present

## 2012-07-23 DIAGNOSIS — E872 Acidosis, unspecified: Secondary | ICD-10-CM | POA: Diagnosis present

## 2012-07-23 DIAGNOSIS — F411 Generalized anxiety disorder: Secondary | ICD-10-CM | POA: Diagnosis present

## 2012-07-23 DIAGNOSIS — G8929 Other chronic pain: Secondary | ICD-10-CM | POA: Diagnosis present

## 2012-07-23 DIAGNOSIS — K5900 Constipation, unspecified: Secondary | ICD-10-CM | POA: Diagnosis present

## 2012-07-23 DIAGNOSIS — K859 Acute pancreatitis without necrosis or infection, unspecified: Secondary | ICD-10-CM

## 2012-07-23 DIAGNOSIS — R109 Unspecified abdominal pain: Secondary | ICD-10-CM

## 2012-07-23 DIAGNOSIS — E1165 Type 2 diabetes mellitus with hyperglycemia: Secondary | ICD-10-CM

## 2012-07-23 DIAGNOSIS — K219 Gastro-esophageal reflux disease without esophagitis: Secondary | ICD-10-CM | POA: Diagnosis present

## 2012-07-23 DIAGNOSIS — N3010 Interstitial cystitis (chronic) without hematuria: Secondary | ICD-10-CM

## 2012-07-23 DIAGNOSIS — Z90710 Acquired absence of both cervix and uterus: Secondary | ICD-10-CM

## 2012-07-23 DIAGNOSIS — E1143 Type 2 diabetes mellitus with diabetic autonomic (poly)neuropathy: Secondary | ICD-10-CM

## 2012-07-23 DIAGNOSIS — E785 Hyperlipidemia, unspecified: Secondary | ICD-10-CM | POA: Diagnosis present

## 2012-07-23 DIAGNOSIS — IMO0001 Reserved for inherently not codable concepts without codable children: Secondary | ICD-10-CM | POA: Diagnosis present

## 2012-07-23 DIAGNOSIS — Z79899 Other long term (current) drug therapy: Secondary | ICD-10-CM

## 2012-07-23 DIAGNOSIS — R1115 Cyclical vomiting syndrome unrelated to migraine: Secondary | ICD-10-CM

## 2012-07-23 DIAGNOSIS — R1013 Epigastric pain: Secondary | ICD-10-CM | POA: Diagnosis present

## 2012-07-23 DIAGNOSIS — J45909 Unspecified asthma, uncomplicated: Secondary | ICD-10-CM | POA: Diagnosis present

## 2012-07-23 DIAGNOSIS — K3189 Other diseases of stomach and duodenum: Principal | ICD-10-CM | POA: Diagnosis present

## 2012-07-23 DIAGNOSIS — F1920 Other psychoactive substance dependence, uncomplicated: Secondary | ICD-10-CM | POA: Diagnosis present

## 2012-07-23 DIAGNOSIS — R739 Hyperglycemia, unspecified: Secondary | ICD-10-CM

## 2012-07-23 DIAGNOSIS — R030 Elevated blood-pressure reading, without diagnosis of hypertension: Secondary | ICD-10-CM | POA: Diagnosis present

## 2012-07-23 DIAGNOSIS — R072 Precordial pain: Secondary | ICD-10-CM

## 2012-07-23 DIAGNOSIS — Z794 Long term (current) use of insulin: Secondary | ICD-10-CM

## 2012-07-23 LAB — URINALYSIS, ROUTINE W REFLEX MICROSCOPIC
Bilirubin Urine: NEGATIVE
Glucose, UA: >1000 mg/dL — AB
Hgb urine dipstick: NEGATIVE
Ketones, ur: 40 mg/dL — AB
Leukocytes, UA: NEGATIVE
Nitrite: NEGATIVE
Protein, ur: NEGATIVE mg/dL
Specific Gravity, Urine: 1.033 — ABNORMAL HIGH (ref 1.005–1.030)
Urobilinogen, UA: 0.2 mg/dL (ref 0.0–1.0)
pH: 8 (ref 5.0–8.0)

## 2012-07-23 LAB — COMPREHENSIVE METABOLIC PANEL
ALT: 28 U/L (ref 0–35)
AST: 29 U/L (ref 0–37)
Albumin: 3.8 g/dL (ref 3.5–5.2)
Alkaline Phosphatase: 77 U/L (ref 39–117)
BUN: 6 mg/dL (ref 6–23)
CO2: 20 mEq/L (ref 19–32)
Calcium: 9.7 mg/dL (ref 8.4–10.5)
Chloride: 95 mEq/L — ABNORMAL LOW (ref 96–112)
Creatinine, Ser: 0.26 mg/dL — ABNORMAL LOW (ref 0.50–1.10)
GFR calc Af Amer: >90 mL/min (ref >90)
GFR calc non Af Amer: >90 mL/min (ref >90)
Glucose, Bld: 366 mg/dL — ABNORMAL HIGH (ref 70–99)
Potassium: 4.4 mEq/L (ref 3.5–5.1)
Sodium: 136 mEq/L (ref 135–145)
Total Bilirubin: 0.4 mg/dL (ref 0.3–1.2)
Total Protein: 7.9 g/dL (ref 6.0–8.3)

## 2012-07-23 LAB — LIPASE, BLOOD: Lipase: 80 U/L — ABNORMAL HIGH (ref 11–59)

## 2012-07-23 LAB — URINE MICROSCOPIC-ADD ON

## 2012-07-23 LAB — PREGNANCY, URINE: Preg Test, Ur: NEGATIVE

## 2012-07-23 LAB — GLUCOSE, CAPILLARY
Glucose-Capillary: 269 mg/dL — ABNORMAL HIGH (ref 70–99)
Glucose-Capillary: 268 mg/dL — ABNORMAL HIGH (ref 70–99)
Glucose-Capillary: 202 mg/dL — ABNORMAL HIGH (ref 70–99)

## 2012-07-23 LAB — CBC WITH DIFFERENTIAL/PLATELET
Basophils Absolute: 0 10*3/uL (ref 0.0–0.1)
Basophils Relative: 0 % (ref 0–1)
Eosinophils Absolute: 0 10*3/uL (ref 0.0–0.7)
Eosinophils Relative: 0 % (ref 0–5)
HCT: 40.2 % (ref 36.0–46.0)
Hemoglobin: 13.7 g/dL (ref 12.0–15.0)
Lymphocytes Relative: 21 % (ref 12–46)
Lymphs Abs: 1.3 10*3/uL (ref 0.7–4.0)
MCH: 26.8 pg (ref 26.0–34.0)
MCHC: 34.1 g/dL (ref 30.0–36.0)
MCV: 78.5 fL (ref 78.0–100.0)
Monocytes Absolute: 0.2 10*3/uL (ref 0.1–1.0)
Monocytes Relative: 2 % — ABNORMAL LOW (ref 3–12)
Neutro Abs: 4.8 10*3/uL (ref 1.7–7.7)
Neutrophils Relative %: 76 % (ref 43–77)
Platelets: 340 10*3/uL (ref 150–400)
RBC: 5.12 MIL/uL — ABNORMAL HIGH (ref 3.87–5.11)
RDW: 12.6 % (ref 11.5–15.5)
WBC: 6.3 10*3/uL (ref 4.0–10.5)

## 2012-07-23 MED ORDER — IOHEXOL 300 MG/ML  SOLN
100.0000 mL | Freq: Once | INTRAMUSCULAR | Status: AC | PRN
  Administered 2012-07-23: 100 mL via INTRAVENOUS

## 2012-07-23 MED ORDER — IOHEXOL 300 MG/ML  SOLN
50.0000 mL | Freq: Once | INTRAMUSCULAR | Status: AC | PRN
  Administered 2012-07-23: 50 mL via ORAL

## 2012-07-23 MED ORDER — HYOSCYAMINE SULFATE 0.125 MG SL SUBL
0.1250 mg | SUBLINGUAL_TABLET | Freq: Three times a day (TID) | SUBLINGUAL | Status: DC
  Administered 2012-07-24 – 2012-07-28 (×13): 0.125 mg via SUBLINGUAL
  Filled 2012-07-23 (×18): qty 1

## 2012-07-23 MED ORDER — HYDROMORPHONE HCL PF 1 MG/ML IJ SOLN
0.5000 mg | INTRAMUSCULAR | Status: DC | PRN
  Administered 2012-07-23 – 2012-07-25 (×8): 1 mg via INTRAVENOUS
  Filled 2012-07-23 (×8): qty 1

## 2012-07-23 MED ORDER — ONDANSETRON HCL 4 MG/2ML IJ SOLN
4.0000 mg | Freq: Once | INTRAMUSCULAR | Status: AC
  Administered 2012-07-23: 4 mg via INTRAVENOUS
  Filled 2012-07-23: qty 2

## 2012-07-23 MED ORDER — SODIUM CHLORIDE 0.9 % IV BOLUS (SEPSIS)
1000.0000 mL | Freq: Once | INTRAVENOUS | Status: AC
  Administered 2012-07-23: 1000 mL via INTRAVENOUS

## 2012-07-23 MED ORDER — DIAZEPAM 5 MG/ML IJ SOLN
5.0000 mg | Freq: Once | INTRAMUSCULAR | Status: AC
  Administered 2012-07-23: 5 mg via INTRAVENOUS
  Filled 2012-07-23: qty 2

## 2012-07-23 MED ORDER — INSULIN ASPART 100 UNIT/ML ~~LOC~~ SOLN
12.0000 [IU] | Freq: Once | SUBCUTANEOUS | Status: AC
  Administered 2012-07-23: 12 [IU] via SUBCUTANEOUS
  Filled 2012-07-23: qty 1

## 2012-07-23 MED ORDER — INSULIN ASPART 100 UNIT/ML ~~LOC~~ SOLN
0.0000 [IU] | Freq: Every day | SUBCUTANEOUS | Status: DC
  Administered 2012-07-23: 2 [IU] via SUBCUTANEOUS

## 2012-07-23 MED ORDER — PROMETHAZINE HCL 25 MG/ML IJ SOLN
12.5000 mg | Freq: Once | INTRAMUSCULAR | Status: AC
  Administered 2012-07-23: 12.5 mg via INTRAVENOUS
  Filled 2012-07-23: qty 1

## 2012-07-23 MED ORDER — PROMETHAZINE HCL 25 MG PO TABS: 25.0000 mg | ORAL_TABLET | Freq: Four times a day (QID) | ORAL | Status: DC | PRN

## 2012-07-23 MED ORDER — FLEET ENEMA 7-19 GM/118ML RE ENEM
1.0000 | ENEMA | Freq: Once | RECTAL | Status: AC | PRN
  Filled 2012-07-23: qty 1

## 2012-07-23 MED ORDER — METOCLOPRAMIDE HCL 5 MG/ML IJ SOLN
10.0000 mg | Freq: Once | INTRAMUSCULAR | Status: AC
  Administered 2012-07-23: 10 mg via INTRAVENOUS
  Filled 2012-07-23: qty 2

## 2012-07-23 MED ORDER — ALBUTEROL SULFATE HFA 108 (90 BASE) MCG/ACT IN AERS
2.0000 | INHALATION_SPRAY | Freq: Four times a day (QID) | RESPIRATORY_TRACT | Status: DC | PRN
  Filled 2012-07-23: qty 6.7

## 2012-07-23 MED ORDER — PROMETHAZINE HCL 25 MG PO TABS: 12.5000 mg | ORAL_TABLET | Freq: Four times a day (QID) | ORAL | Status: DC | PRN

## 2012-07-23 MED ORDER — INSULIN ASPART 100 UNIT/ML ~~LOC~~ SOLN
0.0000 [IU] | Freq: Three times a day (TID) | SUBCUTANEOUS | Status: DC
  Administered 2012-07-24: 3 [IU] via SUBCUTANEOUS
  Administered 2012-07-24: 2 [IU] via SUBCUTANEOUS
  Administered 2012-07-24: 3 [IU] via SUBCUTANEOUS
  Administered 2012-07-25: 2 [IU] via SUBCUTANEOUS
  Administered 2012-07-25 – 2012-07-27 (×4): 3 [IU] via SUBCUTANEOUS
  Administered 2012-07-27 – 2012-07-28 (×2): 2 [IU] via SUBCUTANEOUS

## 2012-07-23 MED ORDER — HYDROMORPHONE HCL PF 1 MG/ML IJ SOLN
0.5000 mg | Freq: Once | INTRAMUSCULAR | Status: AC
  Administered 2012-07-23: 0.5 mg via INTRAVENOUS
  Filled 2012-07-23: qty 1

## 2012-07-23 MED ORDER — BISACODYL 10 MG RE SUPP: 10.0000 mg | Freq: Every day | RECTAL | Status: DC | PRN

## 2012-07-23 MED ORDER — INSULIN GLARGINE 100 UNIT/ML ~~LOC~~ SOLN
30.0000 [IU] | Freq: Every day | SUBCUTANEOUS | Status: DC
  Administered 2012-07-23: 30 [IU] via SUBCUTANEOUS

## 2012-07-23 MED ORDER — PROMETHAZINE HCL 25 MG/ML IJ SOLN
12.5000 mg | Freq: Four times a day (QID) | INTRAMUSCULAR | Status: DC | PRN
  Administered 2012-07-23 – 2012-07-24 (×2): 12.5 mg via INTRAVENOUS
  Filled 2012-07-23 (×2): qty 1

## 2012-07-23 MED ORDER — ACETAMINOPHEN 650 MG RE SUPP: 650.0000 mg | Freq: Four times a day (QID) | RECTAL | Status: DC | PRN

## 2012-07-23 MED ORDER — ONDANSETRON HCL 4 MG PO TABS: 4.0000 mg | ORAL_TABLET | Freq: Four times a day (QID) | ORAL | Status: DC | PRN

## 2012-07-23 MED ORDER — POLYETHYLENE GLYCOL 3350 17 G PO PACK
17.0000 g | PACK | Freq: Two times a day (BID) | ORAL | Status: DC
  Filled 2012-07-23 (×3): qty 1

## 2012-07-23 MED ORDER — ONDANSETRON HCL 4 MG/2ML IJ SOLN
4.0000 mg | Freq: Four times a day (QID) | INTRAMUSCULAR | Status: DC | PRN
  Administered 2012-07-23 – 2012-07-24 (×2): 4 mg via INTRAVENOUS
  Filled 2012-07-23 (×3): qty 2

## 2012-07-23 MED ORDER — ACETAMINOPHEN 325 MG PO TABS: 650.0000 mg | ORAL_TABLET | Freq: Four times a day (QID) | ORAL | Status: DC | PRN

## 2012-07-23 MED ORDER — HYDROMORPHONE HCL PF 1 MG/ML IJ SOLN
1.0000 mg | Freq: Once | INTRAMUSCULAR | Status: AC
  Administered 2012-07-23: 1 mg via INTRAVENOUS
  Filled 2012-07-23: qty 1

## 2012-07-23 MED ORDER — SENNA 8.6 MG PO TABS
1.0000 | ORAL_TABLET | Freq: Two times a day (BID) | ORAL | Status: DC
  Filled 2012-07-23 (×2): qty 1

## 2012-07-23 MED ORDER — ENOXAPARIN SODIUM 40 MG/0.4ML ~~LOC~~ SOLN
40.0000 mg | SUBCUTANEOUS | Status: DC
  Administered 2012-07-23 – 2012-07-27 (×5): 40 mg via SUBCUTANEOUS
  Filled 2012-07-23 (×6): qty 0.4

## 2012-07-23 MED ORDER — SODIUM CHLORIDE 0.9 % IV SOLN
INTRAVENOUS | Status: AC
  Administered 2012-07-23 – 2012-07-25 (×4): via INTRAVENOUS

## 2012-07-23 MED ORDER — METOCLOPRAMIDE HCL 5 MG/ML IJ SOLN
10.0000 mg | Freq: Three times a day (TID) | INTRAMUSCULAR | Status: DC
  Administered 2012-07-23 – 2012-07-28 (×18): 10 mg via INTRAVENOUS
  Filled 2012-07-23 (×23): qty 2

## 2012-07-23 MED ORDER — PROMETHAZINE HCL 25 MG RE SUPP: 12.5000 mg | Freq: Four times a day (QID) | RECTAL | Status: DC | PRN

## 2012-07-23 NOTE — ED Notes (Signed)
CBG 297 

## 2012-07-23 NOTE — ED Notes (Signed)
Diet soda given to pt, not tolerated well, pt vomited.

## 2012-07-23 NOTE — ED Provider Notes (Signed)
History     CSN: [id number]  Arrival date & time 07/23/12  1356   First MD Initiated Contact with Patient 07/23/12 1404      Chief Complaint  Patient presents with  . Emesis  . Abdominal Pain    (Consider location/radiation/quality/duration/timing/severity/associated sxs/prior treatment) HPI Comments: Ms. Fellows presents ambulatory with her mother for evaluation of abdominal pain, nausea, and vomiting.  She reports she has had intractable discomfort, belching, and vomiting since yesterday evening.  The phenergan she takes at home has not been effective.  She was recently admitted here and at [facility] for evaluation of this same issue.  She states the studies were negative for gastroparesis.  She denies fever, diarrhea, and URI-like symptoms.  She also denies hematemesis and bilious emesis.  She reports her sugars have been averaging in the mid 200s.  The history is provided by the patient. No language interpreter was used.    Past Medical History  Diagnosis Date  . Interstitial cystitis   . Asthma   . HLD (hyperlipidemia)   . Gastritis     mild per EGD (04/2012)  . Gastroparesis     presumed diagnosis - unable to tolerate gastric emptying study.  . Exertional dyspnea   . Type II diabetes mellitus 2006    Requiring insulin  . History of blood transfusion 2003  . GERD (gastroesophageal reflux disease)   . Migraines   . Daily headache   . Anxiety     Past Surgical History  Procedure Date  . Esophagogastroduodenoscopy 05/09/2012    Procedure: ESOPHAGOGASTRODUODENOSCOPY (EGD);  Surgeon: Charna Elizabeth, MD;  Location: [facility] ENDOSCOPY;  Service: Endoscopy;  Laterality: N/A;  . Abdominal exploration surgery 2007    "related to interstitial cystitis" (06/05/2012)  . Abdominal hysterectomy 2004    Family History  Problem Relation Age of Onset  . Sarcoidosis Mother   . Hypertension Mother   . Diabetes Mother   . Cerebral aneurysm Maternal Grandmother     History  Substance Use  Topics  . Smoking status: Never Smoker   . Smokeless tobacco: Never Used  . Alcohol Use: No     Comment: rarely    OB History    Grav Para Term Preterm Abortions TAB SAB Ect Mult Living                  Review of Systems  Constitutional: Positive for appetite change and fatigue. Negative for fever, chills, diaphoresis and activity change.  HENT: Negative.   Eyes: Negative.   Respiratory: Negative for cough, chest tightness and shortness of breath.   Cardiovascular: Negative for chest pain, palpitations and leg swelling.  Gastrointestinal: Positive for nausea, vomiting and abdominal pain. Negative for diarrhea, constipation and abdominal distention.  Genitourinary: Negative for dysuria, flank pain and decreased urine volume.  Musculoskeletal: Negative.   Skin: Negative.   Neurological: Positive for light-headedness.    Allergies  Kiwi extract  Home Medications   Current Outpatient Rx  Name  Route  Sig  Dispense  Refill  . ALBUTEROL SULFATE HFA 108 (90 BASE) MCG/ACT IN AERS   Inhalation   Inhale 2 puffs into the lungs every 6 (six) hours as needed. Wheezing or shortness of breath         . INSULIN GLARGINE 100 UNIT/ML [place] SOLN   Subcutaneous   Inject 30 Units into the skin at bedtime.          . INSULIN LISPRO (HUMAN) 100 UNIT/ML [place] SOLN   Subcutaneous  Inject 2-10 Units into the skin 3 (three) times daily before meals. Sliding scale as directed         . OXYCODONE-ACETAMINOPHEN 5-325 MG PO TABS   Oral   Take 1 tablet by mouth every 6 (six) hours as needed. For pain         . PROMETHAZINE HCL 25 MG PO TABS   Oral   Take 1 tablet (25 mg total) by mouth every 6 (six) hours as needed for nausea.   30 tablet   0     BP 142/96  Pulse 94  Temp 98.7 F (37.1 C)  Resp 20  SpO2 100%  LMP 02/15/2003  Physical Exam  Nursing note and vitals reviewed. Constitutional: She is oriented to person, place, and time. She appears well-developed and well-nourished.  She is cooperative.  Non-toxic appearance. She has a sickly appearance. She does not appear ill. No distress.  HENT:  Head: Normocephalic and atraumatic. Head is without abrasion, without contusion, without right periorbital erythema and without left periorbital erythema. No trismus in the jaw.  Right Ear: External ear normal.  Left Ear: External ear normal.  Nose: Nose normal.  Mouth/Throat: Mucous membranes are not pale, dry and not cyanotic. No uvula swelling. No oropharyngeal exudate, posterior oropharyngeal edema, posterior oropharyngeal erythema or tonsillar abscesses.  Eyes: Conjunctivae normal are normal. Pupils are equal, round, and reactive to light. Right eye exhibits no discharge. Left eye exhibits no discharge. No scleral icterus.  Neck: Normal range of motion. No JVD present. No tracheal deviation present.  Cardiovascular: Regular rhythm, normal heart sounds and intact distal pulses.   No extrasystoles are present. Tachycardia present.  Exam reveals no gallop and no friction rub.   No murmur heard. Pulmonary/Chest: Effort normal and breath sounds normal. No stridor. No respiratory distress. She has no wheezes. She has no rales. She exhibits no tenderness.  Abdominal: Soft. She exhibits no distension, no ascites and no mass. There is no hepatosplenomegaly. There is tenderness in the epigastric area. There is guarding. There is no rigidity, no rebound, no CVA tenderness, no tenderness at McBurney's point and negative Murphy's sign. No hernia.  Musculoskeletal: Normal range of motion. She exhibits no edema and no tenderness.  Lymphadenopathy:    She has no cervical adenopathy.  Neurological: She is alert and oriented to person, place, and time. No cranial nerve deficit.  Skin: Skin is warm and dry. No rash noted. She is not diaphoretic. No erythema. No pallor.  Psychiatric: She has a normal mood and affect. Her behavior is normal.    ED Course  Procedures (including critical care  time)   Labs Reviewed  CBC WITH DIFFERENTIAL  COMPREHENSIVE METABOLIC PANEL  URINALYSIS, ROUTINE W REFLEX MICROSCOPIC  LIPASE, BLOOD  PREGNANCY, URINE   No results found.   No diagnosis found.    MDM  Pt presents for evaluation of NV and abdominal discomfort.  Pt appears uncomfortable, note tachycardia, NAD.  She has a hx of type I dm and has been having similar issues including multiple admissions across 3 different hospital systems including [facility], [address], and [facility] since August 2013.  1900.  Pt has continued discomfort and on attempted po challenge, she is again vomiting.  She has already received phenergan, zofran, and reglan.  Will administer zofran and valium.  Her blood sugar was elevated.  Administering SQ insulin.  Discussed her evaluation with Dr. Betti Cruz (hospitlist).  Plan admit for further symptomatic control.  Tobin Chad, MD 07/23/12 [id number]

## 2012-07-23 NOTE — H&P (Addendum)
Patient's PCP: Dr. Clemens Catholic, in [place], [place]  Chief Complaint: Abdominal pain, nausea, and vomiting  History of Present Illness: Marissa Barrett is a 37 y.o. African American female with history of type 2 diabetes, GERD, asthma, and hyperlipidemia who presents with the above complaints.  Patient has been hospitalized multiple times in the last 6 months has had 5 admissions with similar complaints.  Patient has had extensive workup including CT of abdomen and pelvis on 03/21/2012 which showed moderate colonic stool without obstruction.  She has had EGD on 05/09/2012 which showed patchy gastritis otherwise normal EGD.  She has had gastric emptying study on 07/09/2012 which showed a normal solid phase gastric emptying study.  Patient was most recently hospitalized from 07/09/2012 till 07/10/2012 with the similar above complaints, the patient was transferred to [facility] and was hospitalized from 07/11/2012 till 07/17/2012.  It was thought that her symptoms were due to complement of functional motility disorder versus questionable gastroparesis due to poorly controlled diabetes.  Patient was started on Hyoscyaime 125 mcg TID prior to meals instead of Bentyl and gastroparesis diet, patient's symptoms improved and was eventually discharged.  Subsequently after discharge patient notes that her up.oh pain never resolved.  She had persistent nausea.  This morning at 2:30 a.m. she had vomiting, and has had trouble keeping anything down.  The hospitalist service was asked to admit the patient for further care and management as patient did not improve in the emergency department.  Has not had any recent fevers, chills, chest pain, shortness of breath.  Does complain of abdominal pain, and at times appears very uncomfortable.  Denies any diarrhea.  Review of Systems: All systems reviewed with the patient and positive as per history of present illness, otherwise all other systems are negative.  Past Medical  History  Diagnosis Date  . Interstitial cystitis   . Asthma   . HLD (hyperlipidemia)   . Gastritis     mild per EGD (04/2012)  . Gastroparesis     presumed diagnosis - unable to tolerate gastric emptying study.  . Exertional dyspnea   . Type II diabetes mellitus 2006    Requiring insulin  . History of blood transfusion 2003  . GERD (gastroesophageal reflux disease)   . Migraines   . Daily headache   . Anxiety    Past Surgical History  Procedure Date  . Esophagogastroduodenoscopy 05/09/2012    Procedure: ESOPHAGOGASTRODUODENOSCOPY (EGD);  Surgeon: Charna Elizabeth, MD;  Location[facility] ENDOSCOPY;  Service: Endoscopy;  Laterality: N/A;  . Abdominal exploration surgery 2007    "related to interstitial cystitis" (06/05/2012)  . Abdominal hysterectomy 2004   Family History  Problem Relation Age of Onset  . Sarcoidosis Mother   . Hypertension Mother   . Diabetes Mother   . Cerebral aneurysm Maternal Grandmother    History   Social History  . Marital Status: Married    Spouse Name: N/A    Number of Children: 2  . Years of Education: college   Occupational History  . unemployed    Social History Main Topics  . Smoking status: Never Smoker   . Smokeless tobacco: Never Used  . Alcohol Use: No     Comment: rarely  . Drug Use: No  . Sexually Active: Not Currently   Other Topics Concern  . Not on file   Social History Narrative   Lives in [place] with her family.   Allergies: Kiwi extract  Home Meds: Prior to Admission medications  Medication Sig Start Date End Date Taking? Authorizing Provider  albuterol (PROVENTIL HFA;VENTOLIN HFA) 108 (90 BASE) MCG/ACT inhaler Inhale 2 puffs into the lungs every 6 (six) hours as needed. Wheezing or shortness of breath   Yes Historical Provider, MD  insulin glargine (LANTUS) 100 UNIT/ML injection Inject 30 Units into the skin at bedtime.    Yes Historical Provider, MD  insulin lispro (HUMALOG) 100 UNIT/ML injection Inject 2-10 Units  into the skin 3 (three) times daily before meals. Sliding scale as directed   Yes Historical Provider, MD  oxyCODONE-acetaminophen (PERCOCET/ROXICET) 5-325 MG per tablet Take 1 tablet by mouth every 6 (six) hours as needed. For pain   Yes Historical Provider, MD  promethazine (PHENERGAN) 25 MG tablet Take 1 tablet (25 mg total) by mouth every 6 (six) hours as needed for nausea. 07/10/12  Yes Richarda Overlie, MD    Physical Exam: Blood pressure 163/87, pulse 112, temperature 98.7 F (37.1 C), resp. rate 18, last menstrual period 02/15/2003, SpO2 98.00%. General: Awake, Oriented x3, in some distress from abdominal pain. HEENT: EOMI, Moist mucous membranes Neck: Supple CV: S1 and S2 Lungs: Clear to ascultation bilaterally Abdomen: Soft, tender in the epigastric region, Nondistended, +bowel sounds. Ext: Good pulses. Trace edema. No clubbing or cyanosis noted. Neuro: Cranial Nerves II-XII grossly intact. Has 5/5 motor strength in upper and lower extremities.  Lab results:  Basename 07/23/12 1520  NA 136  K 4.4  CL 95*  CO2 20  GLUCOSE 366*  BUN 6  CREATININE 0.26*  CALCIUM 9.7  MG --  PHOS --    Basename 07/23/12 1520  AST 29  ALT 28  ALKPHOS 77  BILITOT 0.4  PROT 7.9  ALBUMIN 3.8    Basename 07/23/12 1520  LIPASE 80*  AMYLASE --    Basename 07/23/12 1520  WBC 6.3  NEUTROABS 4.8  HGB 13.7  HCT 40.2  MCV 78.5  PLT 340   No results found for this basename: CKTOTAL:3,CKMB:3,CKMBINDEX:3,TROPONINI:3 in the last 72 hours No components found with this basename: POCBNP:3 No results found for this basename: DDIMER in the last 72 hours No results found for this basename: HGBA1C:2 in the last 72 hours No results found for this basename: CHOL:2,HDL:2,LDLCALC:2,TRIG:2,CHOLHDL:2,LDLDIRECT:2 in the last 72 hours No results found for this basename: TSH,T4TOTAL,FREET3,T3FREE,THYROIDAB in the last 72 hours No results found for this basename:  VITAMINB12:2,FOLATE:2,FERRITIN:2,TIBC:2,IRON:2,RETICCTPCT:2 in the last 72 hours Imaging results:  Nm Gastric Emptying  07/09/2012  *RADIOLOGY REPORT*  Clinical Data:  Abdominal pain.  NUCLEAR MEDICINE GASTRIC EMPTYING SCAN  Technique:  After oral ingestion of radiolabeled meal, sequential abdominal images were obtained for 120 minutes.  Residual percentage of activity remaining within the stomach was calculated at 60 and 120 minutes.  Radiopharmaceutical:  2.0 mCi Tc-25m sulfur colloid.  Comparison:  None.  Findings: At 60 minutes 46% of the activity remained in the stomach.  At 120 minutes 1.0% of the activity remained in the stomach.  Normal is less than 30% at 120 minutes.  IMPRESSION: Normal solid phase gastric emptying study.   Original Report Authenticated By: Rudie Meyer, M.D.    Assessment & Plan by Problem: Abdominal pain/intractable nausea and vomiting Etiology unclear.  Has had GI workup here and at [facility] which has been unremarkable.  Will get a CT of the abdomen and pelvis with contrast to evaluate for abdominal pain.  Start clear liquid diet and advance as tolerated.  If patient's symptoms do not improve with conservative management may consider getting  GI consultation for further evaluation.  Start scheduled Reglan.  Control nausea with antiemetics.  Continue hyoscyamine 125 mcg po TID which was started at [facility].  TSH and free T4 on 06/05/2012 normal.  Lipase mildly elevated, do not suspect pancreatitis at this time.  Constipation Patient has had constipation in the past.  Start bowel regimen.  CT of abdomen and pelvis with contrast ordered for further evaluation.  Type 2 diabetes uncontrolled with complications Check hemoglobin A1c.  Last available hemoglobin A1c on 05/05/2012 was 12.3 suggesting average blood sugar of 306.  Continue Lantus.  Sliding scale insulin.  Hyperlipidemia Was on statin in the past.  Check lipid panel in the  morning.  History of asthma Stable.  Not an active issue at this time.  Continue albuterol as needed.  Anion gap metabolic acidosis Do not suspect patient to be in DKA.  Likely due to dehydration.  Continue IV hydration and reassess in the gap in the morning.  Patient has had such episodes in the past.  Suspect may have component of starvation ketosis.  Elevated blood pressure Likely due to abdominal pain.  Continue to monitor.  Prophylaxis Lovenox.  CODE STATUS Full code.  Disposition Admit the patient to MedSurg as observation.  Time spent on admission, talking to the patient, and coordinating care was: 60 mins.  Farrie Sann A, MD 07/23/2012, 7:40 PM

## 2012-07-23 NOTE — ED Provider Notes (Signed)
MSE was initiated and I personally evaluated the patient and placed orders (if any) at  2:56 PM on July 23, 2012.  The patient appears stable so that the remainder of the MSE may be completed by another provider. The patient is here with abdominal pain and vomiting. The patient has a history of similar issues in the past.  Carlyle Dolly, PA-C 07/23/12 1456

## 2012-07-23 NOTE — ED Notes (Signed)
Pt reports she has been vomiting since 0200 this morning and has generalized abd pain. Denies any fever. Pt has active vomiting at triage.

## 2012-07-23 NOTE — ED Provider Notes (Signed)
Medical screening examination/treatment/procedure(s) were performed by non-physician practitioner and as supervising physician I was immediately available for consultation/collaboration.   Gwyneth Sprout, MD 07/23/12 1606

## 2012-07-24 LAB — COMPREHENSIVE METABOLIC PANEL
ALT: 18 U/L (ref 0–35)
AST: 14 U/L (ref 0–37)
Calcium: 9.1 mg/dL (ref 8.4–10.5)
Creatinine, Ser: 0.48 mg/dL — ABNORMAL LOW (ref 0.50–1.10)
GFR calc Af Amer: >90 mL/min (ref >90)
Sodium: 140 mEq/L (ref 135–145)
Total Protein: 6.2 g/dL (ref 6.0–8.3)

## 2012-07-24 LAB — GLUCOSE, CAPILLARY
Glucose-Capillary: 161 mg/dL — ABNORMAL HIGH (ref 70–99)
Glucose-Capillary: 176 mg/dL — ABNORMAL HIGH (ref 70–99)
Glucose-Capillary: 141 mg/dL — ABNORMAL HIGH (ref 70–99)

## 2012-07-24 LAB — LIPID PANEL
HDL: 31 mg/dL — ABNORMAL LOW (ref >39)
Total CHOL/HDL Ratio: 6.9 RATIO
Triglycerides: 170 mg/dL — ABNORMAL HIGH (ref <150)

## 2012-07-24 LAB — CBC
HCT: 35.8 % — ABNORMAL LOW (ref 36.0–46.0)
Hemoglobin: 12.2 g/dL (ref 12.0–15.0)
MCH: 27 pg (ref 26.0–34.0)
MCHC: 34.1 g/dL (ref 30.0–36.0)
RBC: 4.52 MIL/uL (ref 3.87–5.11)

## 2012-07-24 LAB — HEMOGLOBIN A1C: Mean Plasma Glucose: 249 mg/dL — ABNORMAL HIGH (ref <117)

## 2012-07-24 MED ORDER — POTASSIUM CHLORIDE CRYS ER 20 MEQ PO TBCR
40.0000 meq | EXTENDED_RELEASE_TABLET | Freq: Two times a day (BID) | ORAL | Status: AC
Start: 2012-07-24 — End: 2012-07-24
  Administered 2012-07-24 (×2): 40 meq via ORAL
  Filled 2012-07-24 (×3): qty 2

## 2012-07-24 MED ORDER — INSULIN GLARGINE 100 UNIT/ML ~~LOC~~ SOLN
38.0000 [IU] | Freq: Every day | SUBCUTANEOUS | Status: DC
  Administered 2012-07-24 – 2012-07-26 (×3): 38 [IU] via SUBCUTANEOUS

## 2012-07-24 MED ORDER — POLYETHYLENE GLYCOL 3350 17 G PO PACK
17.0000 g | PACK | Freq: Every day | ORAL | Status: DC
  Filled 2012-07-24 (×5): qty 1

## 2012-07-24 MED ORDER — MAGNESIUM OXIDE 400 (241.3 MG) MG PO TABS
400.0000 mg | ORAL_TABLET | Freq: Two times a day (BID) | ORAL | Status: AC
  Administered 2012-07-24 (×2): 400 mg via ORAL
  Filled 2012-07-24 (×2): qty 1

## 2012-07-24 MED ORDER — SENNOSIDES-DOCUSATE SODIUM 8.6-50 MG PO TABS
2.0000 | ORAL_TABLET | Freq: Two times a day (BID) | ORAL | Status: DC
  Administered 2012-07-27: 2 via ORAL
  Filled 2012-07-24 (×2): qty 1
  Filled 2012-07-24: qty 2

## 2012-07-24 MED ORDER — PANTOPRAZOLE SODIUM 40 MG PO TBEC
40.0000 mg | DELAYED_RELEASE_TABLET | Freq: Two times a day (BID) | ORAL | Status: DC
  Administered 2012-07-24 – 2012-07-28 (×8): 40 mg via ORAL
  Filled 2012-07-24 (×8): qty 1

## 2012-07-24 MED ORDER — SODIUM CHLORIDE 0.9 % IV SOLN
16.0000 mg | Freq: Four times a day (QID) | INTRAVENOUS | Status: DC | PRN
  Administered 2012-07-26 – 2012-07-27 (×2): 16 mg via INTRAVENOUS
  Filled 2012-07-24 (×4): qty 8

## 2012-07-24 MED ORDER — PROMETHAZINE HCL 25 MG/ML IJ SOLN
12.5000 mg | Freq: Four times a day (QID) | INTRAMUSCULAR | Status: DC | PRN
  Administered 2012-07-25 – 2012-07-28 (×5): 12.5 mg via INTRAVENOUS
  Filled 2012-07-24 (×5): qty 1

## 2012-07-24 MED ORDER — INSULIN GLARGINE 100 UNIT/ML ~~LOC~~ SOLN: 35.0000 [IU] | Freq: Every day | SUBCUTANEOUS | Status: DC

## 2012-07-24 MED ORDER — ONDANSETRON HCL 4 MG PO TABS
16.0000 mg | ORAL_TABLET | Freq: Four times a day (QID) | ORAL | Status: DC | PRN
  Administered 2012-07-26 (×2): 16 mg via ORAL
  Filled 2012-07-24: qty 1
  Filled 2012-07-24 (×2): qty 4

## 2012-07-24 MED ORDER — PROMETHAZINE HCL 25 MG RE SUPP: 25.0000 mg | Freq: Four times a day (QID) | RECTAL | Status: DC | PRN

## 2012-07-24 MED ORDER — PROMETHAZINE HCL 25 MG PO TABS: 25.0000 mg | ORAL_TABLET | Freq: Four times a day (QID) | ORAL | Status: DC | PRN

## 2012-07-24 NOTE — Progress Notes (Signed)
[facility] PROGRESS NOTE  Assessment/Plan: Intractable nausea and vomiting (05/05/2012)/Epigastric pain (07/09/2012) - unclear etiology, EGD on 05/09/2012 which showed patchy gastritis otherwise normal EGD. I will go ahead and start her protonix high dose. - gastric emptying study on 07/09/2012 which showed a normal solid phase gastric emptying study - avoid narcotics or NSAID's. - preferable use Zofran than phenergan. - ct of abdomen and pelvis 1.9.2014 no acute abnormality.  DM (diabetes mellitus), type 2, uncontrolled with complications (05/05/2012) - increase lantus continues SSI. - CBG's [facility].    Code Status: full Family Communication: none  Disposition Plan: home   Consultants:  none  Procedures:  none  Antibiotics:  none (indicate start date, and stop date if known)  HPI/Subjective: Still having Nausea and vomiting.  Objective: Filed Vitals:   07/23/12 1930 07/23/12 2017 07/24/12 0501 07/24/12 1313  BP: 163/87 103/56 102/64 115/72  Pulse: 112 113 93 86  Temp:  98.7 F (37.1 C) 98.3 F (36.8 C) 98.6 F (37 C)  TempSrc:  Oral  Oral  Resp:  19 18 18   Height:  5\' 4"  (1.626 m)    Weight:  90.266 kg (199 lb)    SpO2: 98% 96% 100% 100%    Intake/Output Summary (Last 24 hours) at 07/24/12 1325 Last data filed at 07/24/12 0938  Gross per 24 hour  Intake 816.25 ml  Output    400 ml  Net 416.25 ml   Filed Weights   07/23/12 2017  Weight: 90.266 kg (199 lb)    Exam:  General: Alert, awake, oriented x3, in no acute distress.  HEENT: No bruits, no goiter.  Heart: Regular rate and rhythm, without murmurs, rubs, gallops.  Lungs: Good air movement, bilateral air movement.  Abdomen: Soft, nontender, nondistended, positive bowel sounds.  Neuro: Grossly intact, nonfocal.   Data Reviewed: Basic Metabolic Panel:  Lab 07/24/12 4098 07/23/12 1520  NA 140 136  K 3.3* 4.4  CL 104 95*  CO2 24 20  GLUCOSE 171* 366*  BUN 7 6  CREATININE 0.48*  0.26*  CALCIUM 9.1 9.7  MG -- --  PHOS -- --   Liver Function Tests:  Lab 07/24/12 0442 07/23/12 1520  AST 14 29  ALT 18 28  ALKPHOS 59 77  BILITOT 0.3 0.4  PROT 6.2 7.9  ALBUMIN 2.9* 3.8    Lab 07/23/12 1520  LIPASE 80*  AMYLASE --   No results found for this basename: AMMONIA:5 in the last 168 hours CBC:  Lab 07/24/12 0442 07/23/12 1520  WBC 7.5 6.3  NEUTROABS -- 4.8  HGB 12.2 13.7  HCT 35.8* 40.2  MCV 79.2 78.5  PLT 308 340   Cardiac Enzymes: No results found for this basename: CKTOTAL:5,CKMB:5,CKMBINDEX:5,TROPONINI:5 in the last 168 hours BNP (last 3 results) No results found for this basename: PROBNP:3 in the last 8760 hours CBG:  Lab 07/24/12 1203 07/24/12 0729 07/23/12 2213 07/23/12 1944 07/23/12 1840  GLUCAP 176* 161* 202* 268* 269*    No results found for this or any previous visit (from the past 240 hour(s)).   Studies: Ct Abdomen Pelvis W Contrast  07/23/2012  *RADIOLOGY REPORT*  Clinical Data: Abdominal pain. Nausea and vomiting.  Type 2 diabetes.  CT ABDOMEN AND PELVIS WITH CONTRAST  Technique:  Multidetector CT imaging of the abdomen and pelvis was performed following the standard protocol during bolus administration of intravenous contrast.  Contrast: OMNIPAQUE IOHEXOL 300 MG/ML  SOLN  Comparison: Prior CT abdomen 03/21/2012.  Findings: Increased body habitus.  Unremarkable liver.  No focal hepatic defects or dilated ducts.  Normal gallbladder appearance by CT.  No visible ascites.  Kidneys are symmetric in enhancement and contour without visible masses or hydronephrosis.  No perinephric stranding or visible calculi.  Normal appearance to the bladder, with unremarkable adrenal glands, pancreas, and spleen.  There is no aneurysmal dilatation of the abdominal aorta.  No retroperitoneal adenopathy.  Unremarkable appearing stomach, small bowel, and colon.   No visible appendiceal inflammation.  No pneumoperitoneum, pneumatosis, or free pelvic fluid. Slight  enlarged bilateral iliac and inguinal lymph nodes are unchanged from priors.  Unremarkable appearing pelvis post hysterectomy.  No adnexal lesion.  No aggressive osseous lesions.  Normal heart size with clear lung bases.  IMPRESSION: Unremarkable appearing CT abdomen and pelvis.  No inflammatory process, bowel obstruction, or solid organ abnormality.   Original Report Authenticated By: Davonna Belling, M.D.     Scheduled Meds:   . enoxaparin (LOVENOX) injection  40 mg Subcutaneous Q24H  . hyoscyamine  0.125 mg Sublingual TID AC  . insulin aspart  0-15 Units Subcutaneous TID WC  . insulin aspart  0-5 Units Subcutaneous QHS  . insulin glargine  30 Units Subcutaneous QHS  . metoCLOPramide (REGLAN) injection  10 mg Intravenous TID AC & HS  . polyethylene glycol  17 g Oral BID  . senna  1 tablet Oral BID   Continuous Infusions:   . sodium chloride 75 mL/hr at 07/24/12 1046     FELIZ Rosine Beat  [facility] Pager [phone]. If 8PM-8AM, please contact night-coverage at [email or web address], password [facility] 07/24/2012, 1:25 PM  LOS: 1 day

## 2012-07-24 NOTE — Progress Notes (Signed)
Pt states current pain medication is not having very much effect on helping the pain. She states that she will try to hold off until her next dose and speak with the physician today about other options. Will continue to monitor.

## 2012-07-24 NOTE — Progress Notes (Addendum)
INITIAL NUTRITION ASSESSMENT  DOCUMENTATION CODES Per approved criteria  -Severe malnutrition in the context of chronic illness -Obesity Unspecified   INTERVENTION:  No nutrition intervention at this time ---> patient declined RD to follow for nutrition care plan  NUTRITION DIAGNOSIS: Inadequate oral intake related to decreased appetite, nausea as evidenced by patient report  Goal: Oral intake with meals to meet >/= 90% of estimated nutrition needs  Monitor:  PO intake, weight, labs, I/O's  Reason for Assessment: Malnutrition Screening Tool Report  37 y.o. female  Admitting Dx: Abdominal pain, nausea, and vomiting  ASSESSMENT: Patient has been hospitalized multiple times in the last 6 months with similar complaints; had extensive workup including CT of abdomen/pelvis, EGD and gastric emptying study which showed a normal solid phase gastric emptying.  Patient reports her appetite is "so-so"; PO intake 10% per flowsheet records; endorses a 53 lb weight loss in the past 2 1/2 months; per weight records has lost 27 lbs (12%) since 04/2012; c/o nausea; declining addition of supplements.  Patient meets criteria for severe malnutrition in the context of chronic illness given < 75% intake of energy requirement for > 1 month and 12% weight loss in < 3 months.  Height: Ht Readings from Last 1 Encounters:  07/23/12 5\' 4"  (1.626 m)    Weight: Wt Readings from Last 1 Encounters:  07/23/12 199 lb (90.266 kg)    Ideal Body Weight: 120 lb  % Ideal Body Weight: 165%  Wt Readings from Last 10 Encounters:  07/23/12 199 lb (90.266 kg)  07/09/12 195 lb 1.7 oz (88.5 kg)  06/08/12 205 lb 0.4 oz (93 kg)  05/09/12 226 lb (102.513 kg)  05/09/12 226 lb (102.513 kg)  04/05/12 230 lb (104.327 kg)  03/21/12 230 lb (104.327 kg)  02/26/12 230 lb 6.4 oz (104.509 kg)  02/15/12 229 lb (103.874 kg)    Usual Body Weight: 226 lb  % Usual Body Weight: 88%  BMI:  Body mass index is 34.16  kg/(m^2).  Estimated Nutritional Needs: Kcal: 1800-2000 Protein: 80-90 gm Fluid: 1.8-2.0 L  Skin: Intact  Diet Order: Clear Liquid  EDUCATION NEEDS: -No education needs identified at this time   Intake/Output Summary (Last 24 hours) at 07/24/12 1133 Last data filed at 07/24/12 0938  Gross per 24 hour  Intake 816.25 ml  Output    400 ml  Net 416.25 ml    Last BM: 1/9  Labs:   Lab 07/24/12 0442 07/23/12 1520  NA 140 136  K 3.3* 4.4  CL 104 95*  CO2 24 20  BUN 7 6  CREATININE 0.48* 0.26*  CALCIUM 9.1 9.7  MG -- --  PHOS -- --  GLUCOSE 171* 366*    CBG (last 3)   Basename 07/24/12 0729 07/23/12 2213 07/23/12 1944  GLUCAP 161* 202* 268*    Scheduled Meds:   . enoxaparin (LOVENOX) injection  40 mg Subcutaneous Q24H  . hyoscyamine  0.125 mg Sublingual TID AC  . insulin aspart  0-15 Units Subcutaneous TID WC  . insulin aspart  0-5 Units Subcutaneous QHS  . insulin glargine  30 Units Subcutaneous QHS  . metoCLOPramide (REGLAN) injection  10 mg Intravenous TID AC & HS  . polyethylene glycol  17 g Oral BID  . senna  1 tablet Oral BID    Continuous Infusions:   . sodium chloride 75 mL/hr at 07/24/12 1046    Past Medical History  Diagnosis Date  . Interstitial cystitis   . Asthma   .  HLD (hyperlipidemia)   . Gastritis     mild per EGD (04/2012)  . Type II diabetes mellitus 2006    Requiring insulin  . History of blood transfusion 2003    "I was anemic" (07/23/2012)  . GERD (gastroesophageal reflux disease)   . Migraines   . Daily headache   . Anxiety   . Exertional dyspnea     Past Surgical History  Procedure Date  . Esophagogastroduodenoscopy 05/09/2012    Procedure: ESOPHAGOGASTRODUODENOSCOPY (EGD);  Surgeon: Charna Elizabeth, MD;  Location: [facility] ENDOSCOPY;  Service: Endoscopy;  Laterality: N/A;  . Bladder surgery 2007    "opened me up; related to interstitial cystitis" (06/05/2012)  . Abdominal hysterectomy 2004    "Adenomyosis" (07/23/2012)     Maureen Chatters, RD, LDN Pager #: [phone] After-Hours Pager #: [phone]

## 2012-07-25 DIAGNOSIS — R7309 Other abnormal glucose: Secondary | ICD-10-CM

## 2012-07-25 LAB — BASIC METABOLIC PANEL
BUN: 4 mg/dL — ABNORMAL LOW (ref 6–23)
BUN: 4 mg/dL — ABNORMAL LOW (ref 6–23)
Chloride: 108 mEq/L (ref 96–112)
Creatinine, Ser: 0.39 mg/dL — ABNORMAL LOW (ref 0.50–1.10)
GFR calc Af Amer: >90 mL/min (ref >90)
GFR calc Af Amer: >90 mL/min (ref >90)
GFR calc non Af Amer: >90 mL/min (ref >90)
GFR calc non Af Amer: >90 mL/min (ref >90)
Potassium: 3.6 mEq/L (ref 3.5–5.1)
Sodium: 140 mEq/L (ref 135–145)

## 2012-07-25 LAB — GLUCOSE, CAPILLARY: Glucose-Capillary: 160 mg/dL — ABNORMAL HIGH (ref 70–99)

## 2012-07-25 MED ORDER — SODIUM CHLORIDE 0.9 % IV SOLN
250.0000 mg | Freq: Four times a day (QID) | INTRAVENOUS | Status: DC
  Administered 2012-07-25 – 2012-07-28 (×12): 250 mg via INTRAVENOUS
  Filled 2012-07-25 (×14): qty 250

## 2012-07-25 MED ORDER — HYDROMORPHONE HCL PF 1 MG/ML IJ SOLN
0.5000 mg | INTRAMUSCULAR | Status: DC | PRN
  Administered 2012-07-25 – 2012-07-28 (×14): 1 mg via INTRAVENOUS
  Administered 2012-07-28: 0.5 mg via INTRAVENOUS
  Filled 2012-07-25 (×15): qty 1

## 2012-07-25 NOTE — Progress Notes (Signed)
[facility] PROGRESS NOTE  Assessment/Plan: Intractable nausea and vomiting (05/05/2012)/Epigastric pain (07/09/2012) - unclear etiology, EGD on 05/09/2012 which showed patchy gastritis otherwise normal EGD. - protonix high dose. Erythromycin IV - gastric emptying study on 07/09/2012 which showed a normal solid phase gastric emptying study - avoid narcotics or NSAID's. - preferable use Zofran than phenergan. - ct of abdomen and pelvis 1.9.2014 no acute abnormality. - home in am.  DM (diabetes mellitus), type 2, uncontrolled with complications (05/05/2012) - increase lantus continues SSI. - CBG's [facility].   Code Status: full Family Communication: none  Disposition Plan: home   Consultants:  none  Procedures:  none  Antibiotics:  none (indicate start date, and stop date if known)  HPI/Subjective: Still having Nausea, no vomiting. Will like to advance diet   Objective: Filed Vitals:   07/24/12 0501 07/24/12 1313 07/24/12 2201 07/25/12 0458  BP: 102/64 115/72 132/87 115/74  Pulse: 93 86 83 82  Temp: 98.3 F (36.8 C) 98.6 F (37 C) 99.1 F (37.3 C) 98.4 F (36.9 C)  TempSrc:  Oral Oral Oral  Resp: 18 18 18 18   Height:      Weight:    90.3 kg (199 lb 1.2 oz)  SpO2: 100% 100% 100% 97%    Intake/Output Summary (Last 24 hours) at 07/25/12 0916 Last data filed at 07/25/12 0458  Gross per 24 hour  Intake 1520.75 ml  Output   1800 ml  Net -279.25 ml   Filed Weights   07/23/12 2017 07/25/12 0458  Weight: 90.266 kg (199 lb) 90.3 kg (199 lb 1.2 oz)    Exam:  General: Alert, awake, oriented x3, in no acute distress.  HEENT: No bruits, no goiter.  Heart: Regular rate and rhythm, without murmurs, rubs, gallops.  Lungs: Good air movement, bilateral air movement.  Abdomen: Soft, nontender, nondistended, positive bowel sounds.  Neuro: Grossly intact, nonfocal.   Data Reviewed: Basic Metabolic Panel:  Lab 07/25/12 0981 07/24/12 1549 07/24/12 0442  07/23/12 1520  NA 140 -- 140 136  K 3.6 -- 3.3* 4.4  CL 108 -- 104 95*  CO2 22 -- 24 20  GLUCOSE 116* -- 171* 366*  BUN 4* -- 7 6  CREATININE 0.42* -- 0.48* 0.26*  CALCIUM 8.8 -- 9.1 9.7  MG -- 1.6 -- --  PHOS -- -- -- --   Liver Function Tests:  Lab 07/24/12 0442 07/23/12 1520  AST 14 29  ALT 18 28  ALKPHOS 59 77  BILITOT 0.3 0.4  PROT 6.2 7.9  ALBUMIN 2.9* 3.8    Lab 07/23/12 1520  LIPASE 80*  AMYLASE --   No results found for this basename: AMMONIA:5 in the last 168 hours CBC:  Lab 07/24/12 0442 07/23/12 1520  WBC 7.5 6.3  NEUTROABS -- 4.8  HGB 12.2 13.7  HCT 35.8* 40.2  MCV 79.2 78.5  PLT 308 340   Cardiac Enzymes: No results found for this basename: CKTOTAL:5,CKMB:5,CKMBINDEX:5,TROPONINI:5 in the last 168 hours BNP (last 3 results) No results found for this basename: PROBNP:3 in the last 8760 hours CBG:  Lab 07/25/12 0757 07/24/12 2159 07/24/12 1712 07/24/12 1203 07/24/12 0729  GLUCAP 122* 147* 141* 176* 161*    No results found for this or any previous visit (from the past 240 hour(s)).   Studies: Ct Abdomen Pelvis W Contrast  07/23/2012  *RADIOLOGY REPORT*  Clinical Data: Abdominal pain. Nausea and vomiting.  Type 2 diabetes.  CT ABDOMEN AND PELVIS WITH CONTRAST  Technique:  Multidetector CT  imaging of the abdomen and pelvis was performed following the standard protocol during bolus administration of intravenous contrast.  Contrast: OMNIPAQUE IOHEXOL 300 MG/ML  SOLN  Comparison: Prior CT abdomen 03/21/2012.  Findings: Increased body habitus. Unremarkable liver.  No focal hepatic defects or dilated ducts.  Normal gallbladder appearance by CT.  No visible ascites.  Kidneys are symmetric in enhancement and contour without visible masses or hydronephrosis.  No perinephric stranding or visible calculi.  Normal appearance to the bladder, with unremarkable adrenal glands, pancreas, and spleen.  There is no aneurysmal dilatation of the abdominal aorta.  No  retroperitoneal adenopathy.  Unremarkable appearing stomach, small bowel, and colon.   No visible appendiceal inflammation.  No pneumoperitoneum, pneumatosis, or free pelvic fluid. Slight enlarged bilateral iliac and inguinal lymph nodes are unchanged from priors.  Unremarkable appearing pelvis post hysterectomy.  No adnexal lesion.  No aggressive osseous lesions.  Normal heart size with clear lung bases.  IMPRESSION: Unremarkable appearing CT abdomen and pelvis.  No inflammatory process, bowel obstruction, or solid organ abnormality.   Original Report Authenticated By: Davonna Belling, M.D.     Scheduled Meds:    . enoxaparin (LOVENOX) injection  40 mg Subcutaneous Q24H  . erythromycin  250 mg Intravenous Q6H  . hyoscyamine  0.125 mg Sublingual TID AC  . insulin aspart  0-15 Units Subcutaneous TID WC  . insulin aspart  0-5 Units Subcutaneous QHS  . insulin glargine  38 Units Subcutaneous QHS  . metoCLOPramide (REGLAN) injection  10 mg Intravenous TID AC & HS  . pantoprazole  40 mg Oral BID AC  . polyethylene glycol  17 g Oral Daily  . senna-docusate  2 tablet Oral BID   Continuous Infusions:    . sodium chloride 125 mL/hr at 07/25/12 0403     Marinda Elk  [facility] Pager [phone]. If 8PM-8AM, please contact night-coverage at [email or web address], password [email or web address] 07/25/2012, 9:16 AM  LOS: 2 days

## 2012-07-26 DIAGNOSIS — K3184 Gastroparesis: Secondary | ICD-10-CM

## 2012-07-26 DIAGNOSIS — R1115 Cyclical vomiting syndrome unrelated to migraine: Secondary | ICD-10-CM

## 2012-07-26 DIAGNOSIS — R1013 Epigastric pain: Principal | ICD-10-CM

## 2012-07-26 DIAGNOSIS — E1149 Type 2 diabetes mellitus with other diabetic neurological complication: Secondary | ICD-10-CM

## 2012-07-26 LAB — GLUCOSE, CAPILLARY
Glucose-Capillary: 103 mg/dL — ABNORMAL HIGH (ref 70–99)
Glucose-Capillary: 150 mg/dL — ABNORMAL HIGH (ref 70–99)
Glucose-Capillary: 224 mg/dL — ABNORMAL HIGH (ref 70–99)

## 2012-07-26 MED ORDER — DEXTROSE-NACL 5-0.9 % IV SOLN
INTRAVENOUS | Status: DC
  Administered 2012-07-26 – 2012-07-28 (×3): via INTRAVENOUS

## 2012-07-26 NOTE — Progress Notes (Signed)
[facility] PROGRESS NOTE  Assessment/Plan: Intractable nausea and vomiting (05/05/2012)/Epigastric pain (07/09/2012) - unclear etiology, EGD on 05/09/2012 which showed patchy gastritis otherwise normal EGD.- gastric emptying study on 07/09/2012 which showed a normal solid phase gastric emptying study of narcotics and Reglan. - protonix high dose. Erythromycin IV 1.11.2014. Not improving eating < 20% of food. GI consult. - avoid narcotics or NSAID's. - preferable use Zofran than phenergan. - ct of abdomen and pelvis 1.9.2014 no acute abnormality.  DM (diabetes mellitus), type 2, uncontrolled with complications (05/05/2012) - increase lantus continues SSI. - CBG's [facility].   Code Status: full Family Communication: none  Disposition Plan: home   Consultants:  none  Procedures:  none  Antibiotics:  none (indicate start date, and stop date if known)  HPI/Subjective: Eating minimal.  Objective: Filed Vitals:   07/25/12 1359 07/25/12 2106 07/26/12 0500 07/26/12 0626  BP: 115/76 119/76  105/55  Pulse: 85 96  88  Temp: 98.7 F (37.1 C) 98.6 F (37 C)  98.6 F (37 C)  TempSrc: Oral Oral  Oral  Resp: 16 18  16   Height:      Weight:   90.2 kg (198 lb 13.7 oz)   SpO2: 100% 100%  98%    Intake/Output Summary (Last 24 hours) at 07/26/12 0934 Last data filed at 07/26/12 0500  Gross per 24 hour  Intake   2217 ml  Output    500 ml  Net   1717 ml   Filed Weights   07/23/12 2017 07/25/12 0458 07/26/12 0500  Weight: 90.266 kg (199 lb) 90.3 kg (199 lb 1.2 oz) 90.2 kg (198 lb 13.7 oz)    Exam:  General: Alert, awake, oriented x3, in no acute distress.  HEENT: No bruits, no goiter.  Heart: Regular rate and rhythm, without murmurs, rubs, gallops.  Lungs: Good air movement, bilateral air movement.  Abdomen: Soft, nontender, nondistended, positive bowel sounds.  Neuro: Grossly intact, nonfocal.   Data Reviewed: Basic Metabolic Panel:  Lab 07/25/12 0981 07/25/12  0608 07/24/12 1549 07/24/12 0442 07/23/12 1520  NA 139 140 -- 140 136  K 3.7 3.6 -- 3.3* 4.4  CL 105 108 -- 104 95*  CO2 23 22 -- 24 20  GLUCOSE 134* 116* -- 171* 366*  BUN 4* 4* -- 7 6  CREATININE 0.39* 0.42* -- 0.48* 0.26*  CALCIUM 9.1 8.8 -- 9.1 9.7  MG -- -- 1.6 -- --  PHOS -- -- -- -- --   Liver Function Tests:  Lab 07/24/12 0442 07/23/12 1520  AST 14 29  ALT 18 28  ALKPHOS 59 77  BILITOT 0.3 0.4  PROT 6.2 7.9  ALBUMIN 2.9* 3.8    Lab 07/23/12 1520  LIPASE 80*  AMYLASE --   No results found for this basename: AMMONIA:5 in the last 168 hours CBC:  Lab 07/24/12 0442 07/23/12 1520  WBC 7.5 6.3  NEUTROABS -- 4.8  HGB 12.2 13.7  HCT 35.8* 40.2  MCV 79.2 78.5  PLT 308 340   Cardiac Enzymes: No results found for this basename: CKTOTAL:5,CKMB:5,CKMBINDEX:5,TROPONINI:5 in the last 168 hours BNP (last 3 results) No results found for this basename: PROBNP:3 in the last 8760 hours CBG:  Lab 07/26/12 0808 07/25/12 2153 07/25/12 1721 07/25/12 1159 07/25/12 0757  GLUCAP 103* 160* 165* 106* 122*    No results found for this or any previous visit (from the past 240 hour(s)).   Studies: No results found.  Scheduled Meds:    . enoxaparin (LOVENOX)  injection  40 mg Subcutaneous Q24H  . erythromycin  250 mg Intravenous Q6H  . hyoscyamine  0.125 mg Sublingual TID AC  . insulin aspart  0-15 Units Subcutaneous TID WC  . insulin aspart  0-5 Units Subcutaneous QHS  . insulin glargine  38 Units Subcutaneous QHS  . metoCLOPramide (REGLAN) injection  10 mg Intravenous TID AC & HS  . pantoprazole  40 mg Oral BID AC  . polyethylene glycol  17 g Oral Daily  . senna-docusate  2 tablet Oral BID   Continuous Infusions:    . dextrose 5 % and 0.9% NaCl       Marinda Elk  [facility] Pager [phone]. If 8PM-8AM, please contact night-coverage at [email or web address], password [facility] 07/26/2012, 9:34 AM  LOS: 3 days

## 2012-07-26 NOTE — Progress Notes (Signed)
[facility] Consult: 9:48 AM 07/26/2012   Referring Provider: Dr David Stall  Primary Care Physician:  Ahmed Prima at [facility] in [facility] Primary Gastroenterologist:  Dr. Arty Baumgartner  Reason for Consultation:  Recurrent acute on chronic n/v, upper abdominal pain.   HPI: Marissa Barrett is a 37 y.o. female.  Several hospitalizations (apparently this is # 7 or 8) at [facility] for refractory epigastric pain, nausea vomiting since August 2013.    Extensive testing, 2 EGDs.  Interestingly the nuclear gastric emptying study of 07/09/12 was well within normal limits.  Last admission was 12/26 - 12/27. Has had at least 53 # weight loss since August.  MDs in [place] diagnosed presence of H Pylori and treated this with PPI/Abx.  Has been treated with Reglan, Erythromycin, Protonix, Hyoscyamine replaced Bentyl in late December.  [facility] in late November.   Had EGD 05/09/12 showing only patchy gastroparesis.  No GOO or SBO on x rays, CT scan 3 days ago and ultrasound in 03/2012 were unremarkable.    Continues to have same GI sxs. Readmitted 07/24/11.  Currently GI  meds are IV Erythromycin, hyoscyamine, Regal IV, Potonix 40 mg po BID, Miralax and Senekot.  Says she was not having regular BMs PTA, but was only drinking fluids.   Started Miralx and Senekot since admission, stools are now loose.  No blood in stool.  Occasional small amount of blood in emesis.  Phenergan is more effective than Zofran. Never used Percocet before August.     Past Medical History  Diagnosis Date  . Interstitial cystitis   . Asthma   . HLD (hyperlipidemia)   . Gastritis     mild per EGD (04/2012)  . Type II diabetes mellitus 2006    Requiring insulin  . History of blood transfusion 2003    "I was anemic" (07/23/2012)  . GERD (gastroesophageal reflux disease)   . Migraines   . Daily headache   . Anxiety   . Exertional dyspnea      Past Surgical History  Procedure Date  . Esophagogastroduodenoscopy 05/09/2012    Procedure: ESOPHAGOGASTRODUODENOSCOPY (EGD);  Surgeon: Charna Elizabeth, MD;  Location: [facility] ENDOSCOPY;  Service: Endoscopy;  Laterality: N/A;  . Bladder surgery 2007    "opened me up; related to interstitial cystitis" (06/05/2012)  . Abdominal hysterectomy 2004    "Adenomyosis" (07/23/2012)    Prior to Admission medications   Medication Sig Start Date End Date Taking? Authorizing Provider  albuterol (PROVENTIL HFA;VENTOLIN HFA) 108 (90 BASE) MCG/ACT inhaler Inhale 2 puffs into the lungs every 6 (six) hours as needed. Wheezing or shortness of breath   Yes Historical Provider, MD  insulin glargine (LANTUS) 100 UNIT/ML injection Inject 30 Units into the skin at bedtime.    Yes Historical Provider, MD  insulin lispro (HUMALOG) 100 UNIT/ML injection Inject 2-10 Units into the skin 3 (three) times daily before meals. Sliding scale as directed   Yes Historical Provider, MD  oxyCODONE-acetaminophen (PERCOCET/ROXICET) 5-325 MG per tablet Take 1 tablet by mouth every 6 (six) hours as needed. For pain   Yes Historical Provider, MD  promethazine (PHENERGAN) 25 MG tablet Take 1 tablet (25 mg total) by mouth every 6 (six) hours as needed for nausea. 07/10/12  Yes Richarda Overlie, MD    Scheduled Meds:    . enoxaparin (LOVENOX) injection  40 mg Subcutaneous Q24H  . erythromycin  250 mg Intravenous Q6H  . hyoscyamine  0.125 mg Sublingual TID AC  .  insulin aspart  0-15 Units Subcutaneous TID WC  . insulin aspart  0-5 Units Subcutaneous QHS  . insulin glargine  38 Units Subcutaneous QHS  . metoCLOPramide (REGLAN) injection  10 mg Intravenous TID AC & HS  . pantoprazole  40 mg Oral BID AC  . polyethylene glycol  17 g Oral Daily  . senna-docusate  2 tablet Oral BID   Infusions:    . dextrose 5 % and 0.9% NaCl     PRN Meds: acetaminophen, acetaminophen, albuterol, bisacodyl, HYDROmorphone (DILAUDID) injection, ondansetron  (ZOFRAN) IV, ondansetron, promethazine, promethazine, promethazine   Allergies as of 07/23/2012 - Review Complete 07/23/2012  Allergen Reaction Noted  . Kiwi extract Anaphylaxis, Itching, and Swelling 03/28/2012    Family History  Problem Relation Age of Onset  . Sarcoidosis Mother   . Hypertension Mother   . Diabetes Mother   . Cerebral aneurysm Maternal Grandmother     History   Social History  . Marital Status: Married    Spouse Name: N/A    Number of Children: 2  . Years of Education: college   Occupational History  . unemployed    Social History Main Topics  . Smoking status: Never Smoker   . Smokeless tobacco: Never Used  . Alcohol Use: No     Comment: rarely  . Drug Use: No  . Sexually Active: Not Currently   Other Topics Concern  . Not on file   Social History Narrative   Lives in [place] with her family.    REVIEW OF SYSTEMS: Constitutional:  Weight loss per HPI ENT:  No nose bleeds Pulm:  No cough or dyspnea CV:  No chest pain or palpitations GU:  Urinary frequency due to interstitial cystitis GI:  No dysphagia Heme:  No unusual bleeding.    Transfusions:  none Neuro:  No headaches, no syncope, no gait imbalance, no numbness.  Head CT at [place] past unrevealing Derm:  No sores, rash, itching Endocrine:  Per HPI Travel:  None   PHYSICAL EXAM: Vital signs in last 24 hours: Temp:  [98.6 F (37 C)-98.7 F (37.1 C)] 98.6 F (37 C) (01/12 0626) Pulse Rate:  [85-96] 88  (01/12 0626) Resp:  [16-18] 16  (01/12 0626) BP: (105-119)/(55-76) 105/55 mmHg (01/12 0626) SpO2:  [98 %-100 %] 98 % (01/12 0626) Weight:  [90.2 kg (198 lb 13.7 oz)] 90.2 kg (198 lb 13.7 oz) (01/12 0500)  General: looks well, not toxic.  comfortable Head:  No swelling or asymmetry  Eyes:  No icterus or pallor Ears:  Not HOH  Nose:  No congestion or discharge Mouth:  Clear oral MM.  Good teeth. Neck:  No mass, no TMG,  Lungs:  Clear, no labored breathing.  Heart:  RRR Abdomen:  Soft, Tender across upper abdomen. Rectal: deferred   Musc/Skeltl: no joint swelling Extremities:  No pedal edema  Neurologic:  Oriented x 3.  No tremor, no weakness of limbs Skin:  No rash or sores Tattoos:  none Nodes:  No inguinal or cervical adenopathy   Psych:  Pleasant, depressed  Intake/Output from previous day: 01/11 0701 - 01/12 0700 In: 2217 [P.O.:240; I.V.:1977] Out: 500 [Urine:500] Intake/Output this shift:    LAB RESULTS:  Basename 07/24/12 0442 07/23/12 1520  WBC 7.5 6.3  HGB 12.2 13.7  HCT 35.8* 40.2  PLT 308 340   BMET Lab Results  Component Value Date   NA 139 07/25/2012   NA 140 07/25/2012   NA 140 07/24/2012   K  3.7 07/25/2012   K 3.6 07/25/2012   K 3.3* 07/24/2012   CL 105 07/25/2012   CL 108 07/25/2012   CL 104 07/24/2012   CO2 23 07/25/2012   CO2 22 07/25/2012   CO2 24 07/24/2012   GLUCOSE 134* 07/25/2012   GLUCOSE 116* 07/25/2012   GLUCOSE 171* 07/24/2012   BUN 4* 07/25/2012   BUN 4* 07/25/2012   BUN 7 07/24/2012   CREATININE 0.39* 07/25/2012   CREATININE 0.42* 07/25/2012   CREATININE 0.48* 07/24/2012   CALCIUM 9.1 07/25/2012   CALCIUM 8.8 07/25/2012   CALCIUM 9.1 07/24/2012   LFT  Basename 07/24/12 0442 07/23/12 1520  PROT 6.2 7.9  ALBUMIN 2.9* 3.8  AST 14 29  ALT 18 28  ALKPHOS 59 77  BILITOT 0.3 0.4  BILIDIR -- --  IBILI -- --    Drugs of Abuse  No results found for this basename: labopia, cocainscrnur, labbenz, amphetmu, thcu, labbarb     RADIOLOGY STUDIES: 03/21/2012  Abdominal Ultrasound Findings:  Gallbladder: No gallstones, gallbladder wall thickening, or  pericholecystic fluid.  Common bile duct: Normal caliber with measured diameter of 3.9 mm.  Liver: No focal lesion identified. Within normal limits in  parenchymal echogenicity.  IVC: Appears normal.  Pancreas: Pancreas is not well visualized due to overlying bowel  gas.  Spleen: Spleen length measures 6.3 cm. Normal parenchymal  echotexture.  Right Kidney: Right  kidney measures 11.7 cm length. No  hydronephrosis.  Left Kidney: Left kidney measures 13 x 3 cm length. No  hydronephrosis.  Abdominal aorta: Some portions obscured. Visualized abdominal  aorta is normal caliber.  IMPRESSION:  Negative abdominal ultrasound.  07/09/12  NUCLEAR MEDICINE GASTRIC EMPTYING SCAN Findings: At 60 minutes 46% of the activity remained in the  stomach. At 120 minutes 1.0% of the activity remained in the  stomach. Normal is less than 30% at 120 minutes.  IMPRESSION:  Normal solid phase gastric emptying study.  07/23/12 CT ABDOMEN AND PELVIS WITH CONTRAST Findings: Increased body habitus. Unremarkable liver. No focal  hepatic defects or dilated ducts. Normal gallbladder appearance by  CT. No visible ascites. Kidneys are symmetric in enhancement and  contour without visible masses or hydronephrosis. No perinephric  stranding or visible calculi. Normal appearance to the bladder,  with unremarkable adrenal glands, pancreas, and spleen. There is  no aneurysmal dilatation of the abdominal aorta. No  retroperitoneal adenopathy. Unremarkable appearing stomach, small  bowel, and colon. No visible appendiceal inflammation. No  pneumoperitoneum, pneumatosis, or free pelvic fluid. Slight  enlarged bilateral iliac and inguinal lymph nodes are unchanged  from priors.  Unremarkable appearing pelvis post hysterectomy. No adnexal  lesion. No aggressive osseous lesions. Normal heart size with  clear lung bases.  IMPRESSION: Unremarkable appearing CT abdomen and pelvis. No  inflammatory process, bowel obstruction, or solid organ  abnormality.  Several AAS and 2 view abdominal films: All unremarkable with no worrisome findings.   H[facility] scan at [facility] apparently negative per patient.   ENDOSCOPIC STUDIES: *  EGD 05/09/12  Patchy gastritis   IMPRESSION *  Chronic n/v and upper abdominal pain.  Extensive testing at 4 different hospitals, including 2 EGDs have failed to  determine cause and frequently having breakthrough sxs despite multiple meds.   *  IDDM.  Hx of poor control  With A1c of 14 in 02/2012, 12 04/2012, 10.3 currently.   PLAN: *  The only test I do not see or have not been done [id number], celiac panel.  I will  order the celiac panel   LOS: 3 days   Jennye Moccasin  07/26/2012, 9:48 AM Pager: [phone]      ________________________________________________________________________  [facility] MD note:  I personally examined the patient, reviewed the data and agree with the assessment and plan described above.  Celiac panel ordered.  Drs. Mann/Hung to resume care tomorrow.   Rob Bunting, MD [facility] Pager [phone]

## 2012-07-27 ENCOUNTER — Inpatient Hospital Stay (HOSPITAL_COMMUNITY): Payer: Medicaid Other

## 2012-07-27 LAB — GLIADIN ANTIBODIES, SERUM
Gliadin IgA: 2.8 U/mL (ref <20)
Gliadin IgG: 7.6 U/mL (ref <20)

## 2012-07-27 LAB — GLUCOSE, CAPILLARY: Glucose-Capillary: 159 mg/dL — ABNORMAL HIGH (ref 70–99)

## 2012-07-27 MED ORDER — INSULIN GLARGINE 100 UNIT/ML ~~LOC~~ SOLN
40.0000 [IU] | Freq: Every day | SUBCUTANEOUS | Status: DC
  Administered 2012-07-27: 40 [IU] via SUBCUTANEOUS

## 2012-07-27 NOTE — Progress Notes (Addendum)
[facility] PROGRESS NOTE  Assessment/Plan: Gastric dysmotility disrder/Epigastric pain: - protonix high dose. Erythromycin IV 1.11.2014. Not improving eating. - unclear etiology, EGD on 05/09/2012 which showed patchy gastritis otherwise normal EGD.- gastric emptying study on 07/09/2012 which showed a normal solid phase gastric emptying study of narcotics and Reglan. - preferable use Zofran than phenergan. - ct of abdomen and pelvis 1.9.2014 no acute abnormality. - [facility] GI assistance, recommended CT had.  DM (diabetes mellitus), type 2, uncontrolled with complications (05/05/2012) - increase lantus continues SSI. - CBG's [facility].   Code Status: full Family Communication: none  Disposition Plan: home   Consultants:  none  Procedures:  none  Antibiotics:  none (indicate start date, and stop date if known)  HPI/Subjective: Eating minimal. Still in pain  Objective: Filed Vitals:   07/26/12 1412 07/26/12 2157 07/27/12 0500 07/27/12 0604  BP: 121/77 139/87  137/78  Pulse: 96 94  98  Temp: 98.3 F (36.8 C) 99 F (37.2 C)  98.2 F (36.8 C)  TempSrc: Oral Oral    Resp: 17 18  18   Height:      Weight:   89.8 kg (197 lb 15.6 oz)   SpO2: 99% 99%  100%    Intake/Output Summary (Last 24 hours) at 07/27/12 1115 Last data filed at 07/27/12 0535  Gross per 24 hour  Intake   3175 ml  Output   1200 ml  Net   1975 ml   Filed Weights   07/25/12 0458 07/26/12 0500 07/27/12 0500  Weight: 90.3 kg (199 lb 1.2 oz) 90.2 kg (198 lb 13.7 oz) 89.8 kg (197 lb 15.6 oz)    Exam:  General: Alert, awake, oriented x3, in no acute distress.  Lungs: Good air movement, bilateral air movement.  Abdomen: Soft, nontender, nondistended, positive bowel sounds.     Data Reviewed: Basic Metabolic Panel:  Lab 07/25/12 4098 07/25/12 0608 07/24/12 1549 07/24/12 0442 07/23/12 1520  NA 139 140 -- 140 136  K 3.7 3.6 -- 3.3* 4.4  CL 105 108 -- 104 95*  CO2 23 22 -- 24 20    GLUCOSE 134* 116* -- 171* 366*  BUN 4* 4* -- 7 6  CREATININE 0.39* 0.42* -- 0.48* 0.26*  CALCIUM 9.1 8.8 -- 9.1 9.7  MG -- -- 1.6 -- --  PHOS -- -- -- -- --   Liver Function Tests:  Lab 07/24/12 0442 07/23/12 1520  AST 14 29  ALT 18 28  ALKPHOS 59 77  BILITOT 0.3 0.4  PROT 6.2 7.9  ALBUMIN 2.9* 3.8    Lab 07/23/12 1520  LIPASE 80*  AMYLASE --   No results found for this basename: AMMONIA:5 in the last 168 hours CBC:  Lab 07/24/12 0442 07/23/12 1520  WBC 7.5 6.3  NEUTROABS -- 4.8  HGB 12.2 13.7  HCT 35.8* 40.2  MCV 79.2 78.5  PLT 308 340   Cardiac Enzymes: No results found for this basename: CKTOTAL:5,CKMB:5,CKMBINDEX:5,TROPONINI:5 in the last 168 hours BNP (last 3 results) No results found for this basename: PROBNP:3 in the last 8760 hours CBG:  Lab 07/27/12 0812 07/26/12 2145 07/26/12 1710 07/26/12 1202 07/26/12 0808  GLUCAP 126* 224* 190* 150* 103*    No results found for this or any previous visit (from the past 240 hour(s)).   Studies: No results found.  Scheduled Meds:    . enoxaparin (LOVENOX) injection  40 mg Subcutaneous Q24H  . erythromycin  250 mg Intravenous Q6H  . hyoscyamine  0.125 mg Sublingual  TID AC  . insulin aspart  0-15 Units Subcutaneous TID WC  . insulin aspart  0-5 Units Subcutaneous QHS  . insulin glargine  38 Units Subcutaneous QHS  . metoCLOPramide (REGLAN) injection  10 mg Intravenous TID AC & HS  . pantoprazole  40 mg Oral BID AC  . polyethylene glycol  17 g Oral Daily  . senna-docusate  2 tablet Oral BID   Continuous Infusions:    . dextrose 5 % and 0.9% NaCl 75 mL/hr at 07/27/12 0535     Marinda Elk  [facility] Pager [phone]. If 8PM-8AM, please contact night-coverage at [email or web address], password [facility] 07/27/2012, 11:15 AM  LOS: 4 days

## 2012-07-27 NOTE — Progress Notes (Signed)
Pt c/o constant nausea.  She thinks that she had a CT head several months ago.  I think it is worth repeating.  There's no evidence for gastroparesis but her hyperglycemia clearly has been under poor control as evidenced by elevated Hg A1c.  I think it is imperative to obtain better control since, in some manner, this could be contributing to her symptoms.

## 2012-07-28 MED ORDER — ONDANSETRON HCL 8 MG PO TABS: 16.0000 mg | ORAL_TABLET | Freq: Four times a day (QID) | ORAL | Status: DC | PRN

## 2012-07-28 MED ORDER — INSULIN GLARGINE 100 UNIT/ML ~~LOC~~ SOLN: 40.0000 [IU] | Freq: Every day | SUBCUTANEOUS | Status: DC

## 2012-07-28 MED ORDER — METOCLOPRAMIDE HCL 5 MG/ML IJ SOLN: 10.0000 mg | Freq: Three times a day (TID) | INTRAMUSCULAR | Status: DC

## 2012-07-28 MED ORDER — PANTOPRAZOLE SODIUM 40 MG PO TBEC: 40.0000 mg | DELAYED_RELEASE_TABLET | Freq: Two times a day (BID) | ORAL | Status: DC

## 2012-07-28 MED ORDER — HYOSCYAMINE SULFATE 0.125 MG SL SUBL: 0.1250 mg | SUBLINGUAL_TABLET | Freq: Three times a day (TID) | SUBLINGUAL | Status: DC

## 2012-07-28 MED ORDER — PANTOPRAZOLE SODIUM 40 MG PO TBEC: 40.0000 mg | DELAYED_RELEASE_TABLET | Freq: Every day | ORAL | Status: DC

## 2012-07-28 NOTE — Discharge Summary (Signed)
Physician Discharge Summary  Marissa Barrett ZOX:[id number] DOB: [birth year] DOA: 07/23/2012  PCP: Default, Provider, MD  Admit date: 07/23/2012 Discharge date: 07/28/2012  Time spent: 30 minutes  Recommendations for Outpatient Follow-up:  1. Follow up with [facility]    Discharge Diagnoses:  Active Problems:  Gastric dysmotility  DM (diabetes mellitus), type 2, uncontrolled with complications  Hyperlipidemia  Epigastric pain   Discharge Condition: stable  Diet recommendation: low carb mod diet  Filed Weights   07/26/12 0500 07/27/12 0500 07/28/12 0511  Weight: 90.2 kg (198 lb 13.7 oz) 89.8 kg (197 lb 15.6 oz) 96 kg (211 lb 10.3 oz)    History of present illness:  37 y.o. African American female with history of type 2 diabetes, GERD, asthma, and hyperlipidemia who presents with the above complaints. Patient has been hospitalized multiple times in the last 6 months has had 5 admissions with similar complaints. Patient has had extensive workup including CT of abdomen and pelvis on 03/21/2012 which showed moderate colonic stool without obstruction. She has had EGD on 05/09/2012 which showed patchy gastritis otherwise normal EGD. She has had gastric emptying study on 07/09/2012 which showed a normal solid phase gastric emptying study. Patient was most recently hospitalized from 07/09/2012 till 07/10/2012 with the similar above complaints, the patient was transferred to [facility] and was hospitalized from 07/11/2012 till 07/17/2012. It was thought that her symptoms were due to complement of functional motility disorder versus questionable gastroparesis due to poorly controlled diabetes. Patient was started on Hyoscyaime 125 mcg TID prior to meals instead of Bentyl and gastroparesis diet, patient's symptoms improved and was eventually discharged. Subsequently after discharge patient notes that her up.oh pain never resolved. She had persistent nausea. This morning at 2:30  a.m. she had vomiting, and has had trouble keeping anything down. The hospitalist service was asked to admit the patient for further care and management as patient did not improve in the emergency department. Has not had any recent fevers, chills, chest pain, shortness of breath. Does complain of abdominal pain, and at times appears very uncomfortable. Denies any diarrhea   Hospital Course:  Gastric dysmotility disrder/Epigastric pain: - protonix high dose. reglan and zofrna improved tolerating diet - unclear etiology, EGD on 05/09/2012 which showed patchy gastritis otherwise normal EGD.- gastric emptying study on 07/09/2012 which showed a normal solid phase gastric emptying study of narcotics and Reglan.  - ct of abdomen and pelvis 1.9.2014 no acute abnormality.  - [facility] GI assistance, CT nead no acute IC finding.  DM (diabetes mellitus), type 2, uncontrolled with complications (05/05/2012) - increase lantus 40 continues SSI at home - [facility]'s [facility].   Procedures:  none  Consultations:  GI  Discharge Exam: Filed Vitals:   07/27/12 0604 07/27/12 1348 07/27/12 2117 07/28/12 0511  BP: 137/78 111/68 109/65 120/69  Pulse: 98 88 89 90  Temp: 98.2 F (36.8 C) 98.3 F (36.8 C) 98.6 F (37 C) 98.5 F (36.9 C)  TempSrc:   Oral Oral  Resp: 18 20 20 18   Height:      Weight:    96 kg (211 lb 10.3 oz)  SpO2: 100% 100% 97% 100%    General: A&O x3 Cardiovascular: RR Respiratory: good air movement CTA B/l  Discharge Instructions  Discharge Orders    Future Orders Please Complete By Expires   Diet - low sodium heart healthy      Increase activity slowly          Medication List  As of 07/28/2012  8:43 AM    TAKE these medications         albuterol 108 (90 BASE) MCG/ACT inhaler   Commonly known as: PROVENTIL HFA;VENTOLIN HFA   Inhale 2 puffs into the lungs every 6 (six) hours as needed. Wheezing or shortness of breath      hyoscyamine 0.125 MG SL tablet   Commonly  known as: LEVSIN SL   Place 1 tablet (0.125 mg total) under the tongue 3 (three) times daily before meals.      insulin glargine 100 UNIT/ML injection   Commonly known as: LANTUS   Inject 40 Units into the skin at bedtime.      insulin lispro 100 UNIT/ML injection   Commonly known as: HUMALOG   Inject 2-10 Units into the skin 3 (three) times daily before meals. Sliding scale as directed      metoCLOPramide 5 MG/ML injection   Commonly known as: REGLAN   Inject 2 mLs (10 mg total) into the vein 4 (four) times daily -  before meals and at bedtime.      ondansetron 8 MG tablet   Commonly known as: ZOFRAN   Take 2 tablets (16 mg total) by mouth every 6 (six) hours as needed for nausea.      oxyCODONE-acetaminophen 5-325 MG per tablet   Commonly known as: PERCOCET/ROXICET   Take 1 tablet by mouth every 6 (six) hours as needed. For pain      pantoprazole 40 MG tablet   Commonly known as: PROTONIX   Take 1 tablet (40 mg total) by mouth 2 (two) times daily before a meal.      promethazine 25 MG tablet   Commonly known as: PHENERGAN   Take 1 tablet (25 mg total) by mouth every 6 (six) hours as needed for nausea.           Follow-up Information    Follow up with Default, Provider, MD. In 2 weeks. (hospital follow up)    Contact information:   [address] [place] [address] [phone]           The results of significant diagnostics from this hospitalization (including imaging, microbiology, ancillary and laboratory) are listed below for reference.    Significant Diagnostic Studies: Ct Head Wo Contrast  07/27/2012  *RADIOLOGY REPORT*  Clinical Data:  Nausea vomiting and headache  CT HEAD WITHOUT CONTRAST  Technique:  Contiguous axial images were obtained from the base of the skull through the vertex without contrast  Comparison:  None.  Findings:  The brain has a normal appearance without evidence for hemorrhage, acute infarction, hydrocephalus, or mass lesion.  There is  no extra axial fluid collection.  The skull and paranasal sinuses are normal.  IMPRESSION: Normal CT of the head without contrast.   Original Report Authenticated By: Janeece Riggers, M.D.    Nm Gastric Emptying  07/09/2012  *RADIOLOGY REPORT*  Clinical Data:  Abdominal pain.  NUCLEAR MEDICINE GASTRIC EMPTYING SCAN  Technique:  After oral ingestion of radiolabeled meal, sequential abdominal images were obtained for 120 minutes.  Residual percentage of activity remaining within the stomach was calculated at 60 and 120 minutes.  Radiopharmaceutical:  2.0 mCi Tc-72m sulfur colloid.  Comparison:  None.  Findings: At 60 minutes 46% of the activity remained in the stomach.  At 120 minutes 1.0% of the activity remained in the stomach.  Normal is less than 30% at 120 minutes.  IMPRESSION: Normal solid phase gastric emptying study.  Original Report Authenticated By: Rudie Meyer, M.D.    Ct Abdomen Pelvis W Contrast  07/23/2012  *RADIOLOGY REPORT*  Clinical Data: Abdominal pain. Nausea and vomiting.  Type 2 diabetes.  CT ABDOMEN AND PELVIS WITH CONTRAST  Technique:  Multidetector CT imaging of the abdomen and pelvis was performed following the standard protocol during bolus administration of intravenous contrast.  Contrast: OMNIPAQUE IOHEXOL 300 MG/ML  SOLN  Comparison: Prior CT abdomen 03/21/2012.  Findings: Increased body habitus. Unremarkable liver.  No focal hepatic defects or dilated ducts.  Normal gallbladder appearance by CT.  No visible ascites.  Kidneys are symmetric in enhancement and contour without visible masses or hydronephrosis.  No perinephric stranding or visible calculi.  Normal appearance to the bladder, with unremarkable adrenal glands, pancreas, and spleen.  There is no aneurysmal dilatation of the abdominal aorta.  No retroperitoneal adenopathy.  Unremarkable appearing stomach, small bowel, and colon.   No visible appendiceal inflammation.  No pneumoperitoneum, pneumatosis, or free pelvic  fluid. Slight enlarged bilateral iliac and inguinal lymph nodes are unchanged from priors.  Unremarkable appearing pelvis post hysterectomy.  No adnexal lesion.  No aggressive osseous lesions.  Normal heart size with clear lung bases.  IMPRESSION: Unremarkable appearing CT abdomen and pelvis.  No inflammatory process, bowel obstruction, or solid organ abnormality.   Original Report Authenticated By: Davonna Belling, M.D.     Microbiology: No results found for this or any previous visit (from the past 240 hour(s)).   Labs: Basic Metabolic Panel:  Lab 07/25/12 4098 07/25/12 0608 07/24/12 1549 07/24/12 0442 07/23/12 1520  NA 139 140 -- 140 136  K 3.7 3.6 -- 3.3* 4.4  CL 105 108 -- 104 95*  CO2 23 22 -- 24 20  GLUCOSE 134* 116* -- 171* 366*  BUN 4* 4* -- 7 6  CREATININE 0.39* 0.42* -- 0.48* 0.26*  CALCIUM 9.1 8.8 -- 9.1 9.7  MG -- -- 1.6 -- --  PHOS -- -- -- -- --   Liver Function Tests:  Lab 07/24/12 0442 07/23/12 1520  AST 14 29  ALT 18 28  ALKPHOS 59 77  BILITOT 0.3 0.4  PROT 6.2 7.9  ALBUMIN 2.9* 3.8    Lab 07/23/12 1520  LIPASE 80*  AMYLASE --   No results found for this basename: AMMONIA:5 in the last 168 hours CBC:  Lab 07/24/12 0442 07/23/12 1520  WBC 7.5 6.3  NEUTROABS -- 4.8  HGB 12.2 13.7  HCT 35.8* 40.2  MCV 79.2 78.5  PLT 308 340   Cardiac Enzymes: No results found for this basename: CKTOTAL:5,CKMB:5,CKMBINDEX:5,TROPONINI:5 in the last 168 hours BNP: BNP (last 3 results) No results found for this basename: PROBNP:3 in the last 8760 hours CBG:  Lab 07/28/12 0741 07/27/12 2157 07/27/12 1729 07/27/12 1224 07/27/12 0812  GLUCAP 134* 190* 159* 155* 126*    Signed:  Marinda Elk  [facility] 07/28/2012, 8:43 AM

## 2012-07-28 NOTE — Progress Notes (Signed)
     [facility] Daily Rounding Note 07/28/2012, 8:39 AM  SUBJECTIVE:       Still having pain, less nausea.  Tolerating solids.   OBJECTIVE:         Vital signs in last 24 hours:    Temp:  [98.3 F (36.8 C)-98.6 F (37 C)] 98.5 F (36.9 C) (01/14 0511) Pulse Rate:  [88-90] 90  (01/14 0511) Resp:  [18-20] 18  (01/14 0511) BP: (109-120)/(65-69) 120/69 mmHg (01/14 0511) SpO2:  [97 %-100 %] 100 % (01/14 0511) Weight:  [96 kg (211 lb 10.3 oz)] 96 kg (211 lb 10.3 oz) (01/14 0511) Last BM Date: 07/27/12 General:   Looks better.  Did not rexamine    Intake/Output from previous day: 01/13 0701 - 01/14 0700 In: 1507 [P.O.:720; I.V.:787] Out: -   Intake/Output this shift:    Lab Results: No results found for this basename: WBC:3,HGB:3,HCT:3,PLT:3 in the last 72 hours BMET  [facility] 07/25/12 0855  NA 139  K 3.7  CL 105  CO2 23  GLUCOSE 134*  BUN 4*  CREATININE 0.39*  CALCIUM 9.1   Studies/Results: Ct Head Wo Contrast 07/27/2012  Findings:  The brain has a normal appearance without evidence for hemorrhage, acute infarction, hydrocephalus, or mass lesion.  There is no extra axial fluid collection.  The skull and paranasal sinuses are normal.  IMPRESSION: Normal CT of the head without contrast.   Original Report Authenticated By: Janeece Riggers, M.D.     ASSESMENT: *  Chronic abdominal pain, nausea, vomitting.  At this point narcotics dependent. sxs attributed to diabetic gastroparesis though GES in 06/2012 was normal.  Mild gastritis on EGD in 04/2012.  On Erythromycin, Dilaudid (4 to 5 mg yesterday), Reglan 10 mg IV qid, bid po Protonix, IV Phenergen  *  IDDM with hx poor control *  Interstitial cystitis   PLAN: *  Looks like pt is for d/c home today. *  She needs to detox her off narcotics.  She has no physical  explanation to support ongoing use of narcotics and they are strongly contraindicated in gastroparesis. *  I am also inclined to ultimately stop Levsin, but not  while attempting detox. *  Will decrease to once daily Protonix.   *  Has ROV 3/24 with GI MD Kelton Pillar at [facility] dose not plan to follow pt in outpt setting.    LOS: 5 days   Jennye Moccasin  07/28/2012, 8:39 AM Pager: [phone]   I have personally taken an interval history, reviewed the chart, and examined the patient.  I agree with the extender's note, impression and recommendations.  Barbette Hair. Arlyce Dice, MD, [facility] [phone]

## 2012-07-31 ENCOUNTER — Emergency Department (HOSPITAL_COMMUNITY)
Admission: EM | Admit: 2012-07-31 | Discharge: 2012-07-31 | Disposition: A | Discharge status: Home / Self Care | Payer: Medicaid Other | Attending: Emergency Medicine | Admitting: Emergency Medicine

## 2012-07-31 ENCOUNTER — Encounter (HOSPITAL_COMMUNITY): Payer: Self-pay | Admitting: Emergency Medicine

## 2012-07-31 DIAGNOSIS — Z8719 Personal history of other diseases of the digestive system: Secondary | ICD-10-CM | POA: Insufficient documentation

## 2012-07-31 DIAGNOSIS — R112 Nausea with vomiting, unspecified: Secondary | ICD-10-CM | POA: Insufficient documentation

## 2012-07-31 DIAGNOSIS — Z8709 Personal history of other diseases of the respiratory system: Secondary | ICD-10-CM | POA: Insufficient documentation

## 2012-07-31 DIAGNOSIS — J45909 Unspecified asthma, uncomplicated: Secondary | ICD-10-CM | POA: Insufficient documentation

## 2012-07-31 DIAGNOSIS — R739 Hyperglycemia, unspecified: Secondary | ICD-10-CM

## 2012-07-31 DIAGNOSIS — E1169 Type 2 diabetes mellitus with other specified complication: Secondary | ICD-10-CM | POA: Insufficient documentation

## 2012-07-31 DIAGNOSIS — Z87448 Personal history of other diseases of urinary system: Secondary | ICD-10-CM | POA: Insufficient documentation

## 2012-07-31 DIAGNOSIS — Z8679 Personal history of other diseases of the circulatory system: Secondary | ICD-10-CM | POA: Insufficient documentation

## 2012-07-31 DIAGNOSIS — Z794 Long term (current) use of insulin: Secondary | ICD-10-CM | POA: Insufficient documentation

## 2012-07-31 DIAGNOSIS — R110 Nausea: Secondary | ICD-10-CM

## 2012-07-31 DIAGNOSIS — K219 Gastro-esophageal reflux disease without esophagitis: Secondary | ICD-10-CM | POA: Insufficient documentation

## 2012-07-31 DIAGNOSIS — Z79899 Other long term (current) drug therapy: Secondary | ICD-10-CM | POA: Insufficient documentation

## 2012-07-31 LAB — COMPREHENSIVE METABOLIC PANEL
Albumin: 3.8 g/dL (ref 3.5–5.2)
BUN: 8 mg/dL (ref 6–23)
Calcium: 9.9 mg/dL (ref 8.4–10.5)
Creatinine, Ser: 0.37 mg/dL — ABNORMAL LOW (ref 0.50–1.10)
GFR calc Af Amer: >90 mL/min (ref >90)
Glucose, Bld: 316 mg/dL — ABNORMAL HIGH (ref 70–99)
Total Protein: 8.1 g/dL (ref 6.0–8.3)

## 2012-07-31 LAB — POCT I-STAT, CHEM 8
BUN: 6 mg/dL (ref 6–23)
Chloride: 105 mEq/L (ref 96–112)
Creatinine, Ser: 0.5 mg/dL (ref 0.50–1.10)
Sodium: 137 mEq/L (ref 135–145)
TCO2: 24 mmol/L (ref 0–100)

## 2012-07-31 LAB — GLUCOSE, CAPILLARY: Glucose-Capillary: 263 mg/dL — ABNORMAL HIGH (ref 70–99)

## 2012-07-31 LAB — CBC WITH DIFFERENTIAL/PLATELET
Basophils Relative: 0 % (ref 0–1)
Eosinophils Absolute: 0 10*3/uL (ref 0.0–0.7)
Eosinophils Relative: 0 % (ref 0–5)
Hemoglobin: 14.6 g/dL (ref 12.0–15.0)
Lymphs Abs: 1.7 10*3/uL (ref 0.7–4.0)
MCH: 27.6 pg (ref 26.0–34.0)
MCHC: 35.4 g/dL (ref 30.0–36.0)
MCV: 78.1 fL (ref 78.0–100.0)
Monocytes Relative: 2 % — ABNORMAL LOW (ref 3–12)
Neutrophils Relative %: 81 % — ABNORMAL HIGH (ref 43–77)
RBC: 5.29 MIL/uL — ABNORMAL HIGH (ref 3.87–5.11)

## 2012-07-31 LAB — LIPASE, BLOOD: Lipase: 68 U/L — ABNORMAL HIGH (ref 11–59)

## 2012-07-31 MED ORDER — HYDROMORPHONE HCL PF 2 MG/ML IJ SOLN
2.0000 mg | Freq: Once | INTRAMUSCULAR | Status: AC
  Administered 2012-07-31: 2 mg via INTRAVENOUS
  Filled 2012-07-31: qty 1

## 2012-07-31 MED ORDER — OXYCODONE-ACETAMINOPHEN 5-325 MG PO TABS: 2.0000 | ORAL_TABLET | ORAL | Status: DC | PRN

## 2012-07-31 MED ORDER — LORAZEPAM 2 MG/ML IJ SOLN
2.0000 mg | Freq: Once | INTRAMUSCULAR | Status: AC
  Administered 2012-07-31: 2 mg via INTRAVENOUS
  Filled 2012-07-31: qty 1

## 2012-07-31 MED ORDER — SODIUM CHLORIDE 0.9 % IV BOLUS (SEPSIS)
1000.0000 mL | Freq: Once | INTRAVENOUS | Status: AC
  Administered 2012-07-31: 1000 mL via INTRAVENOUS

## 2012-07-31 MED ORDER — PROMETHAZINE HCL 25 MG PO TABS: 25.0000 mg | ORAL_TABLET | Freq: Four times a day (QID) | ORAL | Status: DC | PRN

## 2012-07-31 MED ORDER — PROMETHAZINE HCL 25 MG/ML IJ SOLN
25.0000 mg | Freq: Once | INTRAMUSCULAR | Status: AC
  Administered 2012-07-31: 25 mg via INTRAVENOUS
  Filled 2012-07-31: qty 1

## 2012-07-31 MED ORDER — DROPERIDOL 2.5 MG/ML IJ SOLN: 1.2500 mg | Freq: Once | INTRAMUSCULAR | Status: DC

## 2012-07-31 NOTE — ED Notes (Signed)
Pt given ginger ale and crackers for PO challenge per [place], [place].

## 2012-07-31 NOTE — ED Notes (Signed)
Pt c/o vomiting that started 07/30/12 around 1800. Reports she had a recent hospital admission for same. No relief with Zofran states Phenergan helps.

## 2012-07-31 NOTE — ED Provider Notes (Signed)
History     CSN: [id number]  Arrival date & time 07/31/12  1191   First MD Initiated Contact with Patient 07/31/12 [phone]      Chief Complaint  Patient presents with  . Emesis    (Consider location/radiation/quality/duration/timing/severity/associated sxs/prior treatment) HPI Comments: Patient is a 37 year old female with a past medical history of diabetes, HLD, GERD and gastritis who presents with a 1 day history of acutely worsening chronic abdominal pain. The pain is located in her epigastrium and does not radiate. The pain is described as cramping and severe. The pain started gradually and progressively worsened since the onset. No alleviating/aggravating factors. The patient has tried nothing for symptoms without relief. Associated symptoms include nausea and vomiting. Patient denies fever, headache, diarrhea, chest pain, SOB, dysuria, constipation, abnormal vaginal bleeding/discharge.      Past Medical History  Diagnosis Date  . Interstitial cystitis   . Asthma   . HLD (hyperlipidemia)   . Gastritis     mild per EGD (04/2012)  . Type II diabetes mellitus 2006    Requiring insulin  . History of blood transfusion 2003    "I was anemic" (07/23/2012)  . GERD (gastroesophageal reflux disease)   . Migraines   . Daily headache   . Anxiety   . Exertional dyspnea     Past Surgical History  Procedure Date  . Esophagogastroduodenoscopy 05/09/2012    Procedure: ESOPHAGOGASTRODUODENOSCOPY (EGD);  Surgeon: Charna Elizabeth, MD;  Location: [facility] ENDOSCOPY;  Service: Endoscopy;  Laterality: N/A;  . Bladder surgery 2007    "opened me up; related to interstitial cystitis" (06/05/2012)  . Abdominal hysterectomy 2004    "Adenomyosis" (07/23/2012)    Family History  Problem Relation Age of Onset  . Sarcoidosis Mother   . Hypertension Mother   . Diabetes Mother   . Cerebral aneurysm Maternal Grandmother     History  Substance Use Topics  . Smoking status: Never Smoker   . Smokeless  tobacco: Never Used  . Alcohol Use: No     Comment: rarely    OB History    Grav Para Term Preterm Abortions TAB SAB Ect Mult Living                  Review of Systems  Gastrointestinal: Positive for nausea, vomiting and abdominal pain.  All other systems reviewed and are negative.    Allergies  Kiwi extract  Home Medications   Current Outpatient Rx  Name  Route  Sig  Dispense  Refill  . ALBUTEROL SULFATE HFA 108 (90 BASE) MCG/ACT IN AERS   Inhalation   Inhale 2 puffs into the lungs every 6 (six) hours as needed. Wheezing or shortness of breath         . HYOSCYAMINE SULFATE 0.125 MG SL SUBL   Sublingual   Place 1 tablet (0.125 mg total) under the tongue 3 (three) times daily before meals.   30 tablet      . INSULIN GLARGINE 100 UNIT/ML [place] SOLN   Subcutaneous   Inject 40 Units into the skin at bedtime.   10 mL   0   . INSULIN LISPRO (HUMAN) 100 UNIT/ML [place] SOLN   Subcutaneous   Inject 2-10 Units into the skin 3 (three) times daily before meals. Sliding scale as directed         . METOCLOPRAMIDE HCL 5 MG/ML IJ SOLN   Intravenous   Inject 2 mLs (10 mg total) into the vein 4 (four) times daily -  before meals and at bedtime.   2 mL      . ONDANSETRON HCL 8 MG PO TABS   Oral   Take 2 tablets (16 mg total) by mouth every 6 (six) hours as needed for nausea.   20 tablet   0   . OXYCODONE-ACETAMINOPHEN 5-325 MG PO TABS   Oral   Take 1 tablet by mouth every 6 (six) hours as needed. For pain         . PANTOPRAZOLE SODIUM 40 MG PO TBEC   Oral   Take 1 tablet (40 mg total) by mouth 2 (two) times daily before a meal.   30 tablet   0   . PROMETHAZINE HCL 25 MG PO TABS   Oral   Take 1 tablet (25 mg total) by mouth every 6 (six) hours as needed for nausea.   30 tablet   0     BP 166/98  Pulse 119  Temp 98.4 F (36.9 C) (Oral)  Resp 20  SpO2 100%  LMP 02/15/2003  Physical Exam  Nursing note and vitals reviewed. Constitutional: She is oriented to  person, place, and time. She appears well-developed and well-nourished. No distress.  HENT:  Head: Normocephalic and atraumatic.  Eyes: Conjunctivae normal are normal.  Neck: Normal range of motion. Neck supple.  Cardiovascular: Normal rate and regular rhythm.  Exam reveals no gallop and no friction rub.   No murmur heard. Pulmonary/Chest: Effort normal and breath sounds normal. She has no wheezes. She has no rales. She exhibits no tenderness.  Abdominal: Soft. She exhibits no distension. There is tenderness. There is no rebound and no guarding.       Epigastric tenderness to palpation. No peritoneal signs.   Musculoskeletal: Normal range of motion.  Neurological: She is alert and oriented to person, place, and time. Coordination normal.       Speech is goal-oriented. Moves limbs without ataxia.   Skin: Skin is warm and dry.  Psychiatric: She has a normal mood and affect. Her behavior is normal.    ED Course  Procedures (including critical care time)  Labs Reviewed  CBC WITH DIFFERENTIAL - Abnormal; Notable for the following:    RBC 5.29 (*)     Neutrophils Relative 81 (*)     Neutro Abs 8.3 (*)     Monocytes Relative 2 (*)     All other components within normal limits  COMPREHENSIVE METABOLIC PANEL - Abnormal; Notable for the following:    CO2 18 (*)     Glucose, Bld 316 (*)     Creatinine, Ser 0.37 (*)     All other components within normal limits  LIPASE, BLOOD - Abnormal; Notable for the following:    Lipase 68 (*)     All other components within normal limits  GLUCOSE, CAPILLARY - Abnormal; Notable for the following:    Glucose-Capillary 263 (*)     All other components within normal limits  POCT I-STAT TROPONIN I  URINALYSIS, ROUTINE W REFLEX MICROSCOPIC   No results found.   1. Nausea   2. Hyperglycemia without ketosis       MDM  9:10 AM Labs pending. Patient will have phenergan and ativan for symptoms. Urinalysis.   12:36 PM Labs unremarkable except for  BG of 316. Patient feeling better. She will have the rest of her fluids and have a PO challenge. If patient tolerates without vomiting, she can be discharged with symptomatic treatment.   2:48 PM Patient feeling  better but still feels nauseous. She is able to tolerate PO. I will discharge her with Phenergan and Percocet. She is agreeable to plan. BG is 263 and vitals have improved. Patient will return with worsening or concerning symptoms. No further evaluation needed at this time.     Emilia Beck, PA-C 07/31/12 1536

## 2012-08-01 NOTE — ED Provider Notes (Signed)
Medical screening examination/treatment/procedure(s) were conducted as a shared visit with non-physician practitioner(s) and myself.  I personally evaluated the patient during the encounter  Hx abdominal pain, nausea, vomiting of unclear etiology despite extensive workup presenting with typical symptoms.  Nontoxic appearing, abdomen soft. No evidence of DKA.  Glynn Octave, MD 08/01/12 [phone]

## 2012-08-02 ENCOUNTER — Encounter (HOSPITAL_COMMUNITY): Payer: Self-pay | Admitting: *Deleted

## 2012-08-02 DIAGNOSIS — J45909 Unspecified asthma, uncomplicated: Secondary | ICD-10-CM | POA: Diagnosis present

## 2012-08-02 DIAGNOSIS — R030 Elevated blood-pressure reading, without diagnosis of hypertension: Secondary | ICD-10-CM | POA: Diagnosis not present

## 2012-08-02 DIAGNOSIS — E785 Hyperlipidemia, unspecified: Secondary | ICD-10-CM | POA: Diagnosis present

## 2012-08-02 DIAGNOSIS — E876 Hypokalemia: Secondary | ICD-10-CM | POA: Diagnosis present

## 2012-08-02 DIAGNOSIS — Z79899 Other long term (current) drug therapy: Secondary | ICD-10-CM

## 2012-08-02 DIAGNOSIS — E1149 Type 2 diabetes mellitus with other diabetic neurological complication: Principal | ICD-10-CM | POA: Diagnosis present

## 2012-08-02 DIAGNOSIS — K219 Gastro-esophageal reflux disease without esophagitis: Secondary | ICD-10-CM | POA: Diagnosis present

## 2012-08-02 DIAGNOSIS — Z6833 Body mass index (BMI) 33.0-33.9, adult: Secondary | ICD-10-CM

## 2012-08-02 DIAGNOSIS — E669 Obesity, unspecified: Secondary | ICD-10-CM | POA: Diagnosis present

## 2012-08-02 DIAGNOSIS — Z794 Long term (current) use of insulin: Secondary | ICD-10-CM

## 2012-08-02 DIAGNOSIS — Z90710 Acquired absence of both cervix and uterus: Secondary | ICD-10-CM

## 2012-08-02 DIAGNOSIS — K3184 Gastroparesis: Secondary | ICD-10-CM | POA: Diagnosis present

## 2012-08-02 DIAGNOSIS — F411 Generalized anxiety disorder: Secondary | ICD-10-CM | POA: Diagnosis present

## 2012-08-02 DIAGNOSIS — E039 Hypothyroidism, unspecified: Secondary | ICD-10-CM | POA: Diagnosis present

## 2012-08-02 LAB — CBC WITH DIFFERENTIAL/PLATELET
Basophils Absolute: 0 10*3/uL (ref 0.0–0.1)
Basophils Relative: 0 % (ref 0–1)
HCT: 42 % (ref 36.0–46.0)
Lymphocytes Relative: 37 % (ref 12–46)
MCHC: 35.7 g/dL (ref 30.0–36.0)
Monocytes Absolute: 0.4 10*3/uL (ref 0.1–1.0)
Neutro Abs: 3.8 10*3/uL (ref 1.7–7.7)
Neutrophils Relative %: 56 % (ref 43–77)
Platelets: 407 10*3/uL — ABNORMAL HIGH (ref 150–400)
RDW: 13 % (ref 11.5–15.5)
WBC: 6.8 10*3/uL (ref 4.0–10.5)

## 2012-08-02 MED ORDER — ONDANSETRON 4 MG PO TBDP
ORAL_TABLET | ORAL | Status: AC
  Administered 2012-08-02: 8 mg
  Filled 2012-08-02: qty 2

## 2012-08-02 MED ORDER — ONDANSETRON HCL 8 MG PO TABS: 8.0000 mg | ORAL_TABLET | Freq: Once | ORAL | Status: DC

## 2012-08-02 NOTE — ED Notes (Addendum)
Discharged from hospital 07/18/12 for abd. Then came back on 07/31/12 and received fluids. N/v. Since then benn n/v. Abd. Prescribed phenergan but cannot hold it down.

## 2012-08-03 ENCOUNTER — Inpatient Hospital Stay (HOSPITAL_COMMUNITY)
Admission: EM | Admit: 2012-08-03 | Discharge: 2012-08-10 | DRG: 074 | Disposition: A | Discharge status: Home / Self Care | Payer: Medicaid Other | Attending: Internal Medicine | Admitting: Internal Medicine

## 2012-08-03 DIAGNOSIS — R739 Hyperglycemia, unspecified: Secondary | ICD-10-CM

## 2012-08-03 DIAGNOSIS — E1143 Type 2 diabetes mellitus with diabetic autonomic (poly)neuropathy: Secondary | ICD-10-CM

## 2012-08-03 DIAGNOSIS — R112 Nausea with vomiting, unspecified: Secondary | ICD-10-CM

## 2012-08-03 DIAGNOSIS — R072 Precordial pain: Secondary | ICD-10-CM

## 2012-08-03 DIAGNOSIS — E785 Hyperlipidemia, unspecified: Secondary | ICD-10-CM

## 2012-08-03 DIAGNOSIS — IMO0002 Reserved for concepts with insufficient information to code with codable children: Secondary | ICD-10-CM

## 2012-08-03 DIAGNOSIS — E1165 Type 2 diabetes mellitus with hyperglycemia: Secondary | ICD-10-CM

## 2012-08-03 DIAGNOSIS — E118 Type 2 diabetes mellitus with unspecified complications: Secondary | ICD-10-CM

## 2012-08-03 DIAGNOSIS — E1149 Type 2 diabetes mellitus with other diabetic neurological complication: Secondary | ICD-10-CM

## 2012-08-03 DIAGNOSIS — K3189 Other diseases of stomach and duodenum: Secondary | ICD-10-CM

## 2012-08-03 DIAGNOSIS — K859 Acute pancreatitis without necrosis or infection, unspecified: Secondary | ICD-10-CM

## 2012-08-03 DIAGNOSIS — R1013 Epigastric pain: Secondary | ICD-10-CM

## 2012-08-03 DIAGNOSIS — N3010 Interstitial cystitis (chronic) without hematuria: Secondary | ICD-10-CM

## 2012-08-03 DIAGNOSIS — E876 Hypokalemia: Secondary | ICD-10-CM

## 2012-08-03 DIAGNOSIS — K3184 Gastroparesis: Secondary | ICD-10-CM

## 2012-08-03 LAB — COMPREHENSIVE METABOLIC PANEL
ALT: 12 U/L (ref 0–35)
Alkaline Phosphatase: 78 U/L (ref 39–117)
BUN: 8 mg/dL (ref 6–23)
CO2: 22 mEq/L (ref 19–32)
Chloride: 98 mEq/L (ref 96–112)
GFR calc Af Amer: >90 mL/min (ref >90)
GFR calc non Af Amer: >90 mL/min (ref >90)
Glucose, Bld: 219 mg/dL — ABNORMAL HIGH (ref 70–99)
Potassium: 3.5 mEq/L (ref 3.5–5.1)
Sodium: 134 mEq/L — ABNORMAL LOW (ref 135–145)
Total Bilirubin: 0.5 mg/dL (ref 0.3–1.2)

## 2012-08-03 LAB — GLUCOSE, CAPILLARY
Glucose-Capillary: 181 mg/dL — ABNORMAL HIGH (ref 70–99)
Glucose-Capillary: 157 mg/dL — ABNORMAL HIGH (ref 70–99)
Glucose-Capillary: 199 mg/dL — ABNORMAL HIGH (ref 70–99)
Glucose-Capillary: 178 mg/dL — ABNORMAL HIGH (ref 70–99)

## 2012-08-03 LAB — CBC
HCT: 41.5 % (ref 36.0–46.0)
Hemoglobin: 14.4 g/dL (ref 12.0–15.0)
MCV: 79.2 fL (ref 78.0–100.0)
WBC: 7.4 10*3/uL (ref 4.0–10.5)

## 2012-08-03 LAB — PROTIME-INR
INR: 1.04 (ref 0.00–1.49)
Prothrombin Time: 13.5 seconds (ref 11.6–15.2)

## 2012-08-03 LAB — RAPID URINE DRUG SCREEN, HOSP PERFORMED
Benzodiazepines: NOT DETECTED
Opiates: POSITIVE — AB

## 2012-08-03 LAB — CREATININE, SERUM
GFR calc Af Amer: >90 mL/min (ref >90)
GFR calc non Af Amer: >90 mL/min (ref >90)

## 2012-08-03 LAB — TSH: TSH: 2.268 u[IU]/mL (ref 0.350–4.500)

## 2012-08-03 MED ORDER — HEPARIN SODIUM (PORCINE) 5000 UNIT/ML IJ SOLN
5000.0000 [IU] | Freq: Three times a day (TID) | INTRAMUSCULAR | Status: DC
  Administered 2012-08-03 – 2012-08-05 (×3): 5000 [IU] via SUBCUTANEOUS
  Filled 2012-08-03 (×26): qty 1

## 2012-08-03 MED ORDER — SODIUM CHLORIDE 0.9 % IV SOLN: INTRAVENOUS | Status: AC

## 2012-08-03 MED ORDER — MORPHINE SULFATE 2 MG/ML IJ SOLN
2.0000 mg | INTRAMUSCULAR | Status: DC | PRN
  Administered 2012-08-03 – 2012-08-05 (×14): 2 mg via INTRAVENOUS
  Filled 2012-08-03 (×14): qty 1

## 2012-08-03 MED ORDER — PROMETHAZINE HCL 25 MG RE SUPP
25.0000 mg | Freq: Four times a day (QID) | RECTAL | Status: DC | PRN
  Administered 2012-08-03: 25 mg via RECTAL
  Filled 2012-08-03: qty 1

## 2012-08-03 MED ORDER — PANTOPRAZOLE SODIUM 40 MG IV SOLR
40.0000 mg | Freq: Two times a day (BID) | INTRAVENOUS | Status: DC
  Administered 2012-08-03 – 2012-08-04 (×4): 40 mg via INTRAVENOUS
  Filled 2012-08-03 (×6): qty 40

## 2012-08-03 MED ORDER — INSULIN GLARGINE 100 UNIT/ML ~~LOC~~ SOLN
20.0000 [IU] | Freq: Two times a day (BID) | SUBCUTANEOUS | Status: DC
  Administered 2012-08-03 – 2012-08-05 (×4): 20 [IU] via SUBCUTANEOUS

## 2012-08-03 MED ORDER — POLYETHYLENE GLYCOL 3350 17 G PO PACK
17.0000 g | PACK | Freq: Every day | ORAL | Status: DC | PRN
  Filled 2012-08-03: qty 1

## 2012-08-03 MED ORDER — SODIUM CHLORIDE 0.9 % IV BOLUS (SEPSIS)
1000.0000 mL | Freq: Once | INTRAVENOUS | Status: AC
  Administered 2012-08-03: 1000 mL via INTRAVENOUS

## 2012-08-03 MED ORDER — LORAZEPAM 2 MG/ML IJ SOLN
1.0000 mg | Freq: Once | INTRAMUSCULAR | Status: AC
  Administered 2012-08-03: 1 mg via INTRAVENOUS
  Filled 2012-08-03: qty 1

## 2012-08-03 MED ORDER — SORBITOL 70 % SOLN
30.0000 mL | Freq: Every day | Status: DC | PRN
  Filled 2012-08-03: qty 30

## 2012-08-03 MED ORDER — LEVOTHYROXINE SODIUM 200 MCG PO TABS
200.0000 ug | ORAL_TABLET | Freq: Every day | ORAL | Status: DC
  Administered 2012-08-04 – 2012-08-10 (×5): 200 ug via ORAL
  Filled 2012-08-03 (×9): qty 1

## 2012-08-03 MED ORDER — FLEET ENEMA 7-19 GM/118ML RE ENEM
1.0000 | ENEMA | Freq: Once | RECTAL | Status: AC | PRN
  Filled 2012-08-03: qty 1

## 2012-08-03 MED ORDER — HALOPERIDOL LACTATE 5 MG/ML IJ SOLN
1.0000 mg | Freq: Once | INTRAMUSCULAR | Status: AC
  Administered 2012-08-03: 1 mg via INTRAVENOUS
  Filled 2012-08-03: qty 1

## 2012-08-03 MED ORDER — GUAIFENESIN-DM 100-10 MG/5ML PO SYRP
5.0000 mL | ORAL_SOLUTION | ORAL | Status: DC | PRN
  Filled 2012-08-03: qty 5

## 2012-08-03 MED ORDER — INSULIN ASPART 100 UNIT/ML ~~LOC~~ SOLN
0.0000 [IU] | SUBCUTANEOUS | Status: DC
Start: 2012-08-03 — End: 2012-08-10
  Administered 2012-08-03: 2 [IU] via SUBCUTANEOUS
  Administered 2012-08-04 – 2012-08-06 (×3): 1 [IU] via SUBCUTANEOUS
  Administered 2012-08-06: 2 [IU] via SUBCUTANEOUS
  Administered 2012-08-06: 3 [IU] via SUBCUTANEOUS
  Administered 2012-08-06: 2 [IU] via SUBCUTANEOUS
  Administered 2012-08-07: 1 [IU] via SUBCUTANEOUS
  Administered 2012-08-07 (×5): 2 [IU] via SUBCUTANEOUS
  Administered 2012-08-09 – 2012-08-10 (×2): 1 [IU] via SUBCUTANEOUS

## 2012-08-03 MED ORDER — PROMETHAZINE HCL 25 MG/ML IJ SOLN
12.5000 mg | Freq: Once | INTRAMUSCULAR | Status: AC
  Administered 2012-08-03: 12.5 mg via INTRAVENOUS
  Filled 2012-08-03: qty 1

## 2012-08-03 MED ORDER — ONDANSETRON HCL 4 MG/2ML IJ SOLN: 4.0000 mg | Freq: Three times a day (TID) | INTRAMUSCULAR | Status: DC | PRN

## 2012-08-03 MED ORDER — PROMETHAZINE HCL 25 MG/ML IJ SOLN
25.0000 mg | Freq: Once | INTRAMUSCULAR | Status: DC
  Filled 2012-08-03: qty 1

## 2012-08-03 MED ORDER — PROMETHAZINE HCL 25 MG/ML IJ SOLN
12.5000 mg | Freq: Four times a day (QID) | INTRAMUSCULAR | Status: DC | PRN
  Administered 2012-08-03 – 2012-08-05 (×6): 12.5 mg via INTRAVENOUS
  Filled 2012-08-03 (×6): qty 1

## 2012-08-03 MED ORDER — ONDANSETRON HCL 4 MG/2ML IJ SOLN
4.0000 mg | Freq: Four times a day (QID) | INTRAMUSCULAR | Status: DC | PRN
  Administered 2012-08-03 – 2012-08-09 (×17): 4 mg via INTRAVENOUS
  Filled 2012-08-03 (×21): qty 2

## 2012-08-03 MED ORDER — ALBUTEROL SULFATE (5 MG/ML) 0.5% IN NEBU
2.5000 mg | INHALATION_SOLUTION | RESPIRATORY_TRACT | Status: DC | PRN
Start: 2012-08-03 — End: 2012-08-10

## 2012-08-03 MED ORDER — INSULIN ASPART 100 UNIT/ML ~~LOC~~ SOLN: 0.0000 [IU] | SUBCUTANEOUS | Status: DC

## 2012-08-03 MED ORDER — ONDANSETRON HCL 4 MG PO TABS: 4.0000 mg | ORAL_TABLET | Freq: Four times a day (QID) | ORAL | Status: DC | PRN

## 2012-08-03 MED ORDER — FENTANYL CITRATE 0.05 MG/ML IJ SOLN
75.0000 ug | Freq: Once | INTRAMUSCULAR | Status: AC
  Administered 2012-08-03: 75 ug via INTRAVENOUS
  Filled 2012-08-03: qty 2

## 2012-08-03 MED ORDER — METOCLOPRAMIDE HCL 10 MG PO TABS
10.0000 mg | ORAL_TABLET | Freq: Three times a day (TID) | ORAL | Status: DC
  Administered 2012-08-03 – 2012-08-05 (×4): 10 mg via ORAL
  Filled 2012-08-03 (×11): qty 1

## 2012-08-03 MED ORDER — PROMETHAZINE HCL 25 MG RE SUPP: 25.0000 mg | Freq: Four times a day (QID) | RECTAL | Status: DC | PRN

## 2012-08-03 MED ORDER — HYOSCYAMINE SULFATE 0.125 MG SL SUBL
0.1250 mg | SUBLINGUAL_TABLET | Freq: Three times a day (TID) | SUBLINGUAL | Status: DC
  Administered 2012-08-05 – 2012-08-07 (×3): 0.125 mg via SUBLINGUAL
  Filled 2012-08-03 (×15): qty 1

## 2012-08-03 MED ORDER — HYDROCODONE-ACETAMINOPHEN 5-325 MG PO TABS: 1.0000 | ORAL_TABLET | ORAL | Status: DC | PRN

## 2012-08-03 MED ORDER — POTASSIUM CHLORIDE IN NACL 20-0.9 MEQ/L-% IV SOLN
INTRAVENOUS | Status: AC
  Administered 2012-08-03 – 2012-08-04 (×3): via INTRAVENOUS
  Filled 2012-08-03 (×5): qty 1000

## 2012-08-03 NOTE — ED Notes (Signed)
Family at bedside. 

## 2012-08-03 NOTE — ED Provider Notes (Addendum)
History     CSN: [id number]  Arrival date & time 08/02/12  2329   First MD Initiated Contact with Patient 08/03/12 0039      No chief complaint on file.   (Consider location/radiation/quality/duration/timing/severity/associated sxs/prior treatment) HPI 37 year old female presents to emergency department complaining of nausea and vomiting and diffuse abdominal pain.  Pt has has multiple ED visits and hospitalizations for same without specific cause found.  Pt is pending visit to [facility] for dysmotility clinic.  Patient was seen in the emergency department on Friday afternoon for same complaint. She reports she had one episode of vomiting on Saturday, and has been vomiting throughout the day today. She has been prescribed Phenergan, but has not been able to keep it down. She is requesting IV Dilaudid and Phenergan to help with her pain and nausea. She denies any fevers, no diarrhea. Last bowel movement was 3 days ago, which he reports is her norm. Pain is across her upper stomach where she feels that her stomach is distended and swollen.  Past Medical History  Diagnosis Date  . Interstitial cystitis   . Asthma   . HLD (hyperlipidemia)   . Gastritis     mild per EGD (04/2012)  . Type II diabetes mellitus 2006    Requiring insulin  . History of blood transfusion 2003    "I was anemic" (07/23/2012)  . GERD (gastroesophageal reflux disease)   . Migraines   . Daily headache   . Anxiety   . Exertional dyspnea     Past Surgical History  Procedure Date  . Esophagogastroduodenoscopy 05/09/2012    Procedure: ESOPHAGOGASTRODUODENOSCOPY (EGD);  Surgeon: Charna Elizabeth, MD;  Location: [facility] ENDOSCOPY;  Service: Endoscopy;  Laterality: N/A;  . Bladder surgery 2007    "opened me up; related to interstitial cystitis" (06/05/2012)  . Abdominal hysterectomy 2004    "Adenomyosis" (07/23/2012)    Family History  Problem Relation Age of Onset  . Sarcoidosis Mother   . Hypertension Mother   . Diabetes  Mother   . Cerebral aneurysm Maternal Grandmother     History  Substance Use Topics  . Smoking status: Never Smoker   . Smokeless tobacco: Never Used  . Alcohol Use: No     Comment: rarely    OB History    Grav Para Term Preterm Abortions TAB SAB Ect Mult Living                  Review of Systems  See History of Present Illness; otherwise all other systems are reviewed and negative  Allergies  Kiwi extract  Home Medications   Current Outpatient Rx  Name  Route  Sig  Dispense  Refill  . ACETAMINOPHEN 500 MG PO TABS   Oral   Take 1,500 mg by mouth every 6 (six) hours as needed. For pain         . ALBUTEROL SULFATE HFA 108 (90 BASE) MCG/ACT IN AERS   Inhalation   Inhale 2 puffs into the lungs every 6 (six) hours as needed. Wheezing or shortness of breath         . HYOSCYAMINE SULFATE 0.125 MG SL SUBL   Sublingual   Place 0.125 mg under the tongue 3 (three) times daily before meals.         . INSULIN GLARGINE 100 UNIT/ML [place] SOLN   Subcutaneous   Inject 40 Units into the skin at bedtime.         . INSULIN LISPRO (HUMAN) 100 UNIT/ML  [place] SOLN   Subcutaneous   Inject 2-10 Units into the skin 4 (four) times daily -  with meals and at bedtime. Inject 2-10 Units into the skin 4 times daily before meals and nightly. Inject 2-10 Units into the skin 4 times daily before meals and nightly. BG = 70-199  None BG = 200-250 2 units BG = 251-300 4 units BG = 301-350 6 units BG = 351-400 8 units BG > 400  10 units and Call Provider    Hypoglycemia Guidelines: If BG < 70 Give 4oz juice and recheck BG in .         [name] Kitchen LEVOTHYROXINE SODIUM 200 MCG PO TABS   Oral   Take 200 mcg by mouth Daily. Take 1 tablet (200 mcg total) by mouth daily. On empty stomach         . METOCLOPRAMIDE HCL 10 MG PO TABS   Oral   Take 10 mg by mouth 4 (four) times daily.         [name] Kitchen ONDANSETRON HCL 8 MG PO TABS   Oral   Take 16 mg by mouth every 6 (six) hours as needed. For nausea           . OXYCODONE-ACETAMINOPHEN 5-325 MG PO TABS   Oral   Take 2 tablets by mouth every 4 (four) hours as needed for pain.   12 tablet   0   . PANTOPRAZOLE SODIUM 40 MG PO TBEC   Oral   Take 40 mg by mouth 2 (two) times daily before a meal.         . POLYETHYLENE GLYCOL 3350 PO PACK   Oral   Take 17 g by mouth Daily. Take 17 g by mouth daily.         [name] Kitchen PROMETHAZINE HCL 25 MG PO TABS   Oral   Take 1 tablet (25 mg total) by mouth every 6 (six) hours as needed for nausea.   12 tablet   0     BP 151/101  Pulse 121  Temp 97.9 F (36.6 C) (Oral)  SpO2 100%  LMP 02/15/2003  Physical Exam  Nursing note and vitals reviewed. Constitutional: She is oriented to person, place, and time. She appears well-developed and well-nourished.  HENT:  Head: Normocephalic and atraumatic.  Nose: Nose normal.  Mouth/Throat: Oropharynx is clear and moist.  Eyes: Conjunctivae normal and EOM are normal. Pupils are equal, round, and reactive to light.  Neck: Normal range of motion. Neck supple. No JVD present. No tracheal deviation present. No thyromegaly present.  Cardiovascular: Normal rate, regular rhythm, normal heart sounds and intact distal pulses.  Exam reveals no gallop and no friction rub.   No murmur heard. Pulmonary/Chest: Effort normal and breath sounds normal. No stridor. No respiratory distress. She has no wheezes. She has no rales. She exhibits no tenderness.  Abdominal: Soft. Bowel sounds are normal. She exhibits no distension and no mass. There is tenderness (diffuse tenderness across the abdomen mainly in the upper quadrants. Palpation of the abdomen causes retching but no vomiting). There is no rebound and no guarding.  Musculoskeletal: Normal range of motion. She exhibits no edema and no tenderness.  Lymphadenopathy:    She has no cervical adenopathy.  Neurological: She is alert and oriented to person, place, and time. No cranial nerve deficit. She exhibits normal muscle tone.  Coordination normal.  Skin: Skin is warm and dry. No rash noted. No erythema. No pallor.  Psychiatric: Her behavior is normal.  Judgment and thought content normal.       Tearful, anxious    ED Course  Procedures (including critical care time)  Labs Reviewed  COMPREHENSIVE METABOLIC PANEL - Abnormal; Notable for the following:    Sodium 134 (*)     Glucose, Bld 219 (*)     Creatinine, Ser 0.47 (*)     All other components within normal limits  CBC WITH DIFFERENTIAL - Abnormal; Notable for the following:    RBC 5.34 (*)     Platelets 407 (*)     All other components within normal limits   No results found.   1. Epigastric pain   2. Nausea and vomiting       MDM  37 year old female with chronic nausea and vomiting who presents with the same. She has had full workup. Lab work today does not show EKG at her glucose is slightly elevated. We do not have droperidol, unfortunately, as this would may help her symptoms the past. We'll try Ativan along with Phenergan. I'm not sure if pain medicines will help especially if she has chronic constipation. May try a low dose of Haldol with some small doses of fentanyl to see if that helps with her symptoms as well. We're unable to control her nausea and vomiting she may require admission again.      6:07 AM The patient has not had any further vomiting while in the emergency room. She is working on a oral challenge. She reports her pain is somewhat improved. She was given a Percocet prescription during her last visit in the ED. Discharge paperwork has been written in anticipation of her tolerating by mouth challenge.  Olivia Mackie, MD 08/03/12 [phone]  6:18 AM She has failed by mouth challenge, with vomiting. We'll discuss with hospitalist for admission.  Olivia Mackie, MD 08/03/12 [phone]

## 2012-08-03 NOTE — ED Notes (Signed)
Patient is resting comfortably. 

## 2012-08-03 NOTE — ED Notes (Signed)
Pt vomited, states she is in pain, Dr Norlene Campbell informed.

## 2012-08-03 NOTE — ED Notes (Signed)
Patient has been throwing up for five months.  Patient said she was just released Tuesday however, they cannot find out what the cause of her throwing up.  At first they thought it was gastroparesis, but then they said no.  Patient was discharge with phenergan PO and percocet po.  Patient is back because she cannot stop throwing up.

## 2012-08-03 NOTE — H&P (Signed)
[facility]                                                                                    Patient Demographics  Marissa Barrett, is a 37 y.o. female  CSN: [id number]  MRN: [id number]  DOB - 1976/02/10  Admit Date - 08/03/2012  Outpatient Primary MD for the patient is LEE,HOLLY, MD   With History of -  Past Medical History  Diagnosis Date  . Interstitial cystitis   . Asthma   . HLD (hyperlipidemia)   . Gastritis     mild per EGD (04/2012)  . Type II diabetes mellitus 2006    Requiring insulin  . History of blood transfusion 2003    "I was anemic" (07/23/2012)  . GERD (gastroesophageal reflux disease)   . Migraines   . Daily headache   . Anxiety   . Exertional dyspnea       Past Surgical History  Procedure Date  . Esophagogastroduodenoscopy 05/09/2012    Procedure: ESOPHAGOGASTRODUODENOSCOPY (EGD);  Surgeon: Charna Elizabeth, MD;  Location: [facility] ENDOSCOPY;  Service: Endoscopy;  Laterality: N/A;  . Bladder surgery 2007    "opened me up; related to interstitial cystitis" (06/05/2012)  . Abdominal hysterectomy 2004    "Adenomyosis" (07/23/2012)    in for   N&V  HPI  Marissa Barrett  is a 37 y.o. 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 Her initial workup in the ER with admitting labs which included CBC CMP were unremarkable. Lipase, UA, urine drug screen, urine pregnancy screen pending, once urine pregnancy is negative I  will check KUB although recent abdominal imaging CT scan on 07/23/2012 was unremarkable.  Patient describes the pain as generalized, nonradiating, constant, no aggravating factors relieved with pain medications, associated with nausea vomiting, no diarrhea no blood or mucus in stool pain ongoing for the last 3-4 days according to the patient.    Review of Systems    In addition to the HPI above,   No Fever-chills, No Headache, No changes with Vision or hearing, No problems swallowing food or Liquids, No Chest pain, Cough or Shortness of Breath, +ve generalised Abdominal pain, + Nausea & Vommitting, no diarrhea No Blood in stool or Urine, No dysuria, No new skin rashes or bruises, No new joints pains-aches,  No new weakness, tingling, numbness in any extremity, No recent weight gain or loss, No polyuria, polydypsia or polyphagia, No significant Mental Stressors.  A full 10 point Review of Systems was done, except as stated above, all other Review of Systems were negative.   Social History History  Substance Use Topics  . Smoking  status: Never Smoker   . Smokeless tobacco: Never Used  . Alcohol Use: No     Comment: rarely      Family History Family History  Problem Relation Age of Onset  . Sarcoidosis Mother   . Hypertension Mother   . Diabetes Mother   . Cerebral aneurysm Maternal Grandmother       Prior to Admission medications   Medication Sig Start Date End Date Taking? Authorizing Provider  acetaminophen (TYLENOL) 500 MG tablet Take 1,500 mg by mouth every 6 (six) hours as needed. For pain   Yes Historical Provider, MD  albuterol (PROVENTIL HFA;VENTOLIN HFA) 108 (90 BASE) MCG/ACT inhaler Inhale 2 puffs into the lungs every 6 (six) hours as needed. Wheezing or shortness of breath   Yes Historical Provider, MD  hyoscyamine (LEVSIN SL) 0.125 MG SL tablet Place 0.125 mg under the tongue 3 (three) times daily before meals. 07/28/12  Yes Marinda Elk, MD  insulin  glargine (LANTUS) 100 UNIT/ML injection Inject 40 Units into the skin at bedtime. 07/28/12  Yes Marinda Elk, MD  insulin lispro (HUMALOG) 100 UNIT/ML injection Inject 2-10 Units into the skin 4 (four) times daily -  with meals and at bedtime. Inject 2-10 Units into the skin 4 times daily before meals and nightly. Inject 2-10 Units into the skin 4 times daily before meals and nightly. BG = 70-199  None BG = 200-250 2 units BG = 251-300 4 units BG = 301-350 6 units BG = 351-400 8 units BG > 400  10 units and Call Provider    Hypoglycemia Guidelines: If BG < 70 Give 4oz juice and recheck BG in . 06/26/12  Yes Historical Provider, MD  levothyroxine (SYNTHROID, LEVOTHROID) 200 MCG tablet Take 200 mcg by mouth Daily. Take 1 tablet (200 mcg total) by mouth daily. On empty stomach 04/30/12  Yes Historical Provider, MD  metoCLOPramide (REGLAN) 10 MG tablet Take 10 mg by mouth 4 (four) times daily.   Yes Historical Provider, MD  ondansetron (ZOFRAN) 8 MG tablet Take 16 mg by mouth every 6 (six) hours as needed. For nausea 07/28/12  Yes Marinda Elk, MD  oxyCODONE-acetaminophen (PERCOCET/ROXICET) 5-325 MG per tablet Take 2 tablets by mouth every 4 (four) hours as needed for pain. 07/31/12  Yes Kaitlyn Szekalski, PA-C  pantoprazole (PROTONIX) 40 MG tablet Take 40 mg by mouth 2 (two) times daily before a meal. 07/28/12  Yes Marinda Elk, MD  polyethylene glycol [facility] Ethelene Hal) packet Take 17 g by mouth Daily. Take 17 g by mouth daily. 06/10/12  Yes Historical Provider, MD  promethazine (PHENERGAN) 25 MG tablet Take 1 tablet (25 mg total) by mouth every 6 (six) hours as needed for nausea. 07/31/12  Yes Kaitlyn Szekalski, PA-C  promethazine (PHENERGAN) 25 MG suppository Place 1 suppository (25 mg total) rectally every 6 (six) hours as needed for nausea. 08/03/12   Olivia Mackie, MD    Allergies  Allergen Reactions  . Kiwi Extract Anaphylaxis, Itching and Swelling    And tongue swelling     Physical Exam  Vitals  Blood pressure 163/99, pulse 111, temperature 99.2 F (37.3 C), temperature source Oral, last menstrual period 02/15/2003, SpO2 99.00%.   1. General Young African American female lying in bed in NAD, complaining of nausea and abdominal pain  2. Normal affect and insight, Not Suicidal or Homicidal, Awake Alert, Oriented X 3.  3. No F.N deficits, ALL C.Nerves Intact, Strength 5/5 all 4 extremities, Sensation  intact all 4 extremities, Plantars down going.  4. Ears and Eyes appear Normal, Conjunctivae clear, PERRLA. Moist Oral Mucosa.  5. Supple Neck, No JVD, No cervical lymphadenopathy appriciated, No Carotid Bruits.  6. Symmetrical Chest wall movement, Good air movement bilaterally, CTAB.  7. RRR, No Gallops, Rubs or Murmurs, No Parasternal Heave.  8. Positive Bowel Sounds, Abdomen Soft, Non tender, No organomegaly appriciated,No rebound -guarding or rigidity.  9.  No Cyanosis, Normal Skin Turgor, No Skin Rash or Bruise.  10. Good muscle tone,  joints appear normal , no effusions, Normal ROM.  11. No Palpable Lymph Nodes in Neck or Axillae     Data Review  CBC  Lab 08/02/12 2336 07/31/12 1420 07/31/12 0901  WBC 6.8 -- 10.2  HGB 15.0 14.6 14.6  HCT 42.0 43.0 41.3  PLT 407* -- 334  MCV 78.7 -- 78.1  MCH 28.1 -- 27.6  MCHC 35.7 -- 35.4  RDW 13.0 -- 13.0  LYMPHSABS 2.5 -- 1.7  MONOABS 0.4 -- 0.2  EOSABS 0.0 -- 0.0  BASOSABS 0.0 -- 0.0  BANDABS -- -- --   ------------------------------------------------------------------------------------------------------------------  Chemistries   Lab 08/02/12 2336 07/31/12 1420 07/31/12 0901  NA 134* 137 135  K 3.5 4.4 3.9  CL 98 105 98  CO2 22 -- 18*  GLUCOSE 219* 261* 316*  BUN 8 6 8   CREATININE 0.47* 0.50 0.37*  CALCIUM 9.8 -- 9.9  MG -- -- --  AST 15 -- 14  ALT 12 -- 14  ALKPHOS 78 -- 79  BILITOT 0.5 -- 0.3    ------------------------------------------------------------------------------------------------------------------ CrCl is unknown because both a height and weight (above a minimum accepted value) are required for this calculation. ------------------------------------------------------------------------------------------------------------------ No results found for this basename: TSH,T4TOTAL,FREET3,T3FREE,THYROIDAB in the last 72 hours   Coagulation profile No results found for this basename: INR:5,PROTIME:5 in the last 168 hours ------------------------------------------------------------------------------------------------------------------- No results found for this basename: DDIMER:2 in the last 72 hours -------------------------------------------------------------------------------------------------------------------  Cardiac Enzymes No results found for this basename: CK:3,CKMB:3,TROPONINI:3,MYOGLOBIN:3 in the last 168 hours ------------------------------------------------------------------------------------------------------------------ No components found with this basename: POCBNP:3   ---------------------------------------------------------------------------------------------------------------  Urinalysis    Component Value Date/Time   COLORURINE YELLOW 07/23/2012 1616   APPEARANCEUR CLEAR 07/23/2012 1616   LABSPEC 1.033* 07/23/2012 1616   PHURINE 8.0 07/23/2012 1616   GLUCOSEU >1000* 07/23/2012 1616   HGBUR NEGATIVE 07/23/2012 1616   BILIRUBINUR NEGATIVE 07/23/2012 1616   BILIRUBINUR neg 02/15/2012 0849   KETONESUR 40* 07/23/2012 1616   PROTEINUR NEGATIVE 07/23/2012 1616   UROBILINOGEN 0.2 07/23/2012 1616   UROBILINOGEN 0.2 02/15/2012 0849   NITRITE NEGATIVE 07/23/2012 1616   NITRITE neg 02/15/2012 0849   LEUKOCYTESUR NEGATIVE 07/23/2012 1616    ----------------------------------------------------------------------------------------------------------------  Imaging results:   Ct Head Wo  Contrast  07/27/2012  *RADIOLOGY REPORT*  Clinical Data:  Nausea vomiting and headache  CT HEAD WITHOUT CONTRAST  Technique:  Contiguous axial images were obtained from the base of the skull through the vertex without contrast  Comparison:  None.  Findings:  The brain has a normal appearance without evidence for hemorrhage, acute infarction, hydrocephalus, or mass lesion.  There is no extra axial fluid collection.  The skull and paranasal sinuses are normal.  IMPRESSION: Normal CT of the head without contrast.   Original Report Authenticated By: Janeece Riggers, M.D.    Nm Gastric Emptying  07/09/2012  *RADIOLOGY REPORT*  Clinical Data:  Abdominal pain.  NUCLEAR MEDICINE GASTRIC EMPTYING SCAN  Technique:  After oral ingestion of radiolabeled meal, sequential abdominal  images were obtained for 120 minutes.  Residual percentage of activity remaining within the stomach was calculated at 60 and 120 minutes.  Radiopharmaceutical:  2.0 mCi Tc-21m sulfur colloid.  Comparison:  None.  Findings: At 60 minutes 46% of the activity remained in the stomach.  At 120 minutes 1.0% of the activity remained in the stomach.  Normal is less than 30% at 120 minutes.  IMPRESSION: Normal solid phase gastric emptying study.   Original Report Authenticated By: Rudie Meyer, M.D.    Ct Abdomen Pelvis W Contrast  07/23/2012  *RADIOLOGY REPORT*  Clinical Data: Abdominal pain. Nausea and vomiting.  Type 2 diabetes.  CT ABDOMEN AND PELVIS WITH CONTRAST  Technique:  Multidetector CT imaging of the abdomen and pelvis was performed following the standard protocol during bolus administration of intravenous contrast.  Contrast: OMNIPAQUE IOHEXOL 300 MG/ML  SOLN  Comparison: Prior CT abdomen 03/21/2012.  Findings: Increased body habitus. Unremarkable liver.  No focal hepatic defects or dilated ducts.  Normal gallbladder appearance by CT.  No visible ascites.  Kidneys are symmetric in enhancement and contour without visible masses or  hydronephrosis.  No perinephric stranding or visible calculi.  Normal appearance to the bladder, with unremarkable adrenal glands, pancreas, and spleen.  There is no aneurysmal dilatation of the abdominal aorta.  No retroperitoneal adenopathy.  Unremarkable appearing stomach, small bowel, and colon.   No visible appendiceal inflammation.  No pneumoperitoneum, pneumatosis, or free pelvic fluid. Slight enlarged bilateral iliac and inguinal lymph nodes are unchanged from priors.  Unremarkable appearing pelvis post hysterectomy.  No adnexal lesion.  No aggressive osseous lesions.  Normal heart size with clear lung bases.  IMPRESSION: Unremarkable appearing CT abdomen and pelvis.  No inflammatory process, bowel obstruction, or solid organ abnormality.   Original Report Authenticated By: Davonna Belling, M.D.          Assessment & Plan  Principal Problem:  *Nausea & vomiting Active Problems:  DM (diabetes mellitus), type 2, uncontrolled with complications  Diabetic gastroparesis associated with type 2 diabetes mellitus  Epigastric pain    1. Recurrent generalized abdominal pain nausea vomiting in a patient with history of gastritis, questionable gastric dysmotility with recent stable gastric emptying study. Who has been diagnosed as having sluggish gut you to chronic narcotic use - patient will be kept with a 3 hour observation, bowel rest with clear liquid diet only, IV fluids, check a UA, urine drug screen, lipase, KUB, supportive care with pain medications and nausea medications along with Reglan, patient counseled to minimize narcotics however she persistently is asking for IV narcotics despite appearing quite comfortable. Since she has recent diagnosis of gastritis we'll switch her PPI to IV, GI will be involved if no benefit from above care.  Of note patient is following with motility clinic at [facility] and has an appointment pending on 08/11/2012.    2. Diabetes mellitus type 2 patient  insulin-dependent. At this time we'll cut her Lantus dose to twice a day instead of once a day, every 4 hour sliding scale and monitor glucose. Her baseline diabetes control appears to be poor.  Lab Results  Component Value Date   HGBA1C 10.3* 07/24/2012    CBG (last 3)   Basename 07/31/12 1214  GLUCAP 263*     3. History of interstitial cystitis, chronic anemia- no acute issues outpatient followup, baseline UA will be checked here.    DVT Prophylaxis Heparin    AM Labs Ordered, also please review Full Orders  Family  Communication: Admission, patients condition and plan of care including tests being ordered have been discussed with the patient and husband who indicate understanding and agree with the plan and Code Status.  Code Status full  Likely DC to  home  Time spent in minutes : 45  Condition Marinell Blight K M.D on 08/03/2012 at 7:51 AM  Between 7am to 7pm - Pager - [phone]  After 7pm go to [email or web address] - password [email or web address]  And look for the night coverage person covering me after hours  [facility] Office  [phone]

## 2012-08-03 NOTE — ED Notes (Signed)
Advised Dr. Norlene Campbell about blood pressure, HR and low grade temp.

## 2012-08-03 NOTE — ED Notes (Signed)
PO challenge started

## 2012-08-04 LAB — COMPREHENSIVE METABOLIC PANEL
AST: 10 U/L (ref 0–37)
Albumin: 3.2 g/dL — ABNORMAL LOW (ref 3.5–5.2)
Alkaline Phosphatase: 67 U/L (ref 39–117)
Chloride: 103 mEq/L (ref 96–112)
Creatinine, Ser: 0.5 mg/dL (ref 0.50–1.10)
Potassium: 3.4 mEq/L — ABNORMAL LOW (ref 3.5–5.1)
Total Bilirubin: 0.6 mg/dL (ref 0.3–1.2)
Total Protein: 6.8 g/dL (ref 6.0–8.3)

## 2012-08-04 LAB — GLUCOSE, CAPILLARY
Glucose-Capillary: 107 mg/dL — ABNORMAL HIGH (ref 70–99)
Glucose-Capillary: 99 mg/dL (ref 70–99)

## 2012-08-04 MED ORDER — POTASSIUM CHLORIDE IN NACL 40-0.9 MEQ/L-% IV SOLN
INTRAVENOUS | Status: DC
  Administered 2012-08-04 (×2): via INTRAVENOUS
  Administered 2012-08-05: 125 mL/h via INTRAVENOUS
  Administered 2012-08-05 – 2012-08-09 (×4): via INTRAVENOUS
  Filled 2012-08-04 (×12): qty 1000

## 2012-08-04 NOTE — Progress Notes (Signed)
[facility] PROGRESS NOTE  Marissa Barrett ZOX:[id number] DOB: 1975/12/06 DOA: 08/03/2012 PCP: Ahmed Prima, MD  Assessment/Plan: Principal Problem:  *Nausea & vomiting Active Problems:  DM (diabetes mellitus), type 2, uncontrolled with complications  Diabetic gastroparesis associated with type 2 diabetes mellitus  Epigastric pain    1. Nausea & vomiting: Patient has a history of recurrent episodes of vomiting, and has been extensively worked up in the past. She has now presented with yet another episode of upper abdominal pain, nausea and vomiting. Fortunately, Lipase is normal at 43. Managing for possible acute gastritis, with bowel rest, PPI, antiemetics and iv fluids. Today, she has had no vomiting, although she remains nauseated. Will probably try clears on 08/05/12.  2. DM (diabetes mellitus): This is type 2, and appears reasonably controlled at this time. Managing with SSI/lantus.  3. Diabetic gastroparesis: Gastric emptying times have been normal in the recent past. Patient however, has gastric dysmotility. She has been urged to keep her appointment with the dysmotility clinic at [facility], scheduled for 08/11/12.     Code Status: Full Code.  Family Communication:  Disposition Plan: To be determined.    Brief narrative: 37 y.o. 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 Her initial workup in the ER with admitting labs which  included CBC CMP were unremarkable. Recent abdominal imaging CT scan on 07/23/2012 was unremarkable. Patient describes the pain as generalized, nonradiating, constant, no aggravating factors relieved with pain medications, associated with nausea vomiting, no diarrhea no blood or mucus in stool pain ongoing for the last 3-4 days according to the patient. Admitted for further management.    Consultants:  N/A.   Procedures:  N/A.   Antibiotics:  N/A.   HPI/Subjective: Feels better. Still has epigastric pain, but no vomiting.  Objective: Vital signs in last 24 hours: Temp:  [98.1 F (36.7 C)-99.4 F (37.4 C)] 98.2 F (36.8 C) (01/21 0943) Pulse Rate:  [85-103] 87  (01/21 0943) Resp:  [16-20] 16  (01/21 0943) BP: (111-151)/(66-87) 111/76 mmHg (01/21 0943) SpO2:  [97 %-100 %] 100 % (01/21 0943) Weight:  [89.1 kg (196 lb 6.9 oz)] 89.1 kg (196 lb 6.9 oz) (01/21 0604) Weight change:  Last BM Date: 08/02/12  Intake/Output from previous day: 01/20 0701 - 01/21 0700 In: 2153.7 [I.V.:2153.7] Out: 1000 [Urine:1000]     Physical Exam: General: Comfortable, alert, communicative, fully oriented, not short of breath at rest.  HEENT:  No clinical pallor, no jaundice, no conjunctival injection or discharge. Hydration status is satisfactory.  NECK:  Supple, JVP not seen, no carotid bruits, no palpable lymphadenopathy, no palpable goiter. CHEST:  Clinically clear to auscultation, no wheezes, no crackles. HEART:  Sounds 1 and 2 heard, normal, regular, no murmurs. ABDOMEN:  Moderately obese, soft, moderately tender in the epigastrium, no guarding, no rebound, no palpable organomegaly, no palpable masses, normal bowel sounds. GENITALIA:  Not examined. LOWER EXTREMITIES:  No pitting edema, palpable peripheral pulses. MUSCULOSKELETAL SYSTEM:  Unremarkable. . CENTRAL NERVOUS SYSTEM:  No focal neurologic deficit on gross examination.  Lab Results:  Basename 08/03/12 0838 08/02/12 2336  WBC  7.4 6.8  HGB 14.4 15.0  HCT 41.5 42.0  PLT 340 407*    Basename 08/04/12 0645 08/03/12 0838 08/02/12 2336  NA 138 -- 134*  K 3.4* -- 3.5  CL 103 -- 98  CO2 24 -- 22  GLUCOSE 106* -- 219*  BUN 8 -- 8  CREATININE 0.50 0.40* --  CALCIUM 9.1 -- 9.8   No results found for this or any previous visit (from the past 240 hour(s)).   Studies/Results: No results found.  Medications: Scheduled Meds:   . heparin  5,000 Units Subcutaneous Q8H  . hyoscyamine  0.125 mg Sublingual TID AC  . insulin aspart  0-9 Units Subcutaneous Q4H  . insulin glargine  20 Units Subcutaneous BID  . levothyroxine  200 mcg Oral QAC breakfast  . metoCLOPramide  10 mg Oral TID AC & HS  . ondansetron  8 mg Oral Once  . pantoprazole (PROTONIX) IV  40 mg Intravenous Q12H   Continuous Infusions:  PRN Meds:.albuterol, guaiFENesin-dextromethorphan, HYDROcodone-acetaminophen, morphine injection, ondansetron (ZOFRAN) IV, ondansetron, polyethylene glycol, promethazine, sorbitol    LOS: 1 day   Khiyan Crace,CHRISTOPHER  [facility] Pager [phone]. If 8PM-8AM, please contact night-coverage at [email or web address], password [email or web address] 08/04/2012, 12:42 PM  LOS: 1 day

## 2012-08-05 ENCOUNTER — Inpatient Hospital Stay (HOSPITAL_COMMUNITY): Payer: Medicaid Other

## 2012-08-05 DIAGNOSIS — E876 Hypokalemia: Secondary | ICD-10-CM | POA: Diagnosis present

## 2012-08-05 LAB — GLUCOSE, CAPILLARY
Glucose-Capillary: 72 mg/dL (ref 70–99)
Glucose-Capillary: 84 mg/dL (ref 70–99)

## 2012-08-05 LAB — COMPREHENSIVE METABOLIC PANEL
AST: 8 U/L (ref 0–37)
Albumin: 2.9 g/dL — ABNORMAL LOW (ref 3.5–5.2)
Calcium: 9 mg/dL (ref 8.4–10.5)
Chloride: 107 mEq/L (ref 96–112)
Creatinine, Ser: 0.69 mg/dL (ref 0.50–1.10)
Total Bilirubin: 0.5 mg/dL (ref 0.3–1.2)

## 2012-08-05 LAB — CBC
Hemoglobin: 12.9 g/dL (ref 12.0–15.0)
MCH: 27.3 pg (ref 26.0–34.0)
MCV: 80.8 fL (ref 78.0–100.0)
RBC: 4.73 MIL/uL (ref 3.87–5.11)

## 2012-08-05 MED ORDER — PANTOPRAZOLE SODIUM 40 MG PO TBEC
40.0000 mg | DELAYED_RELEASE_TABLET | Freq: Two times a day (BID) | ORAL | Status: DC
  Administered 2012-08-05: 40 mg via ORAL
  Filled 2012-08-05: qty 1

## 2012-08-05 MED ORDER — PROMETHAZINE HCL 25 MG/ML IJ SOLN
12.5000 mg | Freq: Once | INTRAMUSCULAR | Status: AC
  Administered 2012-08-05: 12.5 mg via INTRAVENOUS
  Filled 2012-08-05: qty 1

## 2012-08-05 MED ORDER — INSULIN GLARGINE 100 UNIT/ML ~~LOC~~ SOLN
10.0000 [IU] | Freq: Two times a day (BID) | SUBCUTANEOUS | Status: DC
  Administered 2012-08-06 – 2012-08-08 (×5): 10 [IU] via SUBCUTANEOUS
  Administered 2012-08-09: 10:00:00 via SUBCUTANEOUS

## 2012-08-05 MED ORDER — PANTOPRAZOLE SODIUM 40 MG IV SOLR: 40.0000 mg | INTRAVENOUS | Status: DC

## 2012-08-05 MED ORDER — HYDROCODONE-ACETAMINOPHEN 5-325 MG PO TABS
1.0000 | ORAL_TABLET | ORAL | Status: DC | PRN
  Administered 2012-08-09 – 2012-08-10 (×3): 1 via ORAL
  Filled 2012-08-05 (×3): qty 1
  Filled 2012-08-05: qty 2

## 2012-08-05 MED ORDER — PANTOPRAZOLE SODIUM 40 MG IV SOLR
40.0000 mg | Freq: Two times a day (BID) | INTRAVENOUS | Status: DC
  Administered 2012-08-06 – 2012-08-09 (×7): 40 mg via INTRAVENOUS
  Filled 2012-08-05 (×9): qty 40

## 2012-08-05 MED ORDER — MORPHINE SULFATE 2 MG/ML IJ SOLN
1.0000 mg | INTRAMUSCULAR | Status: DC | PRN
  Administered 2012-08-05 – 2012-08-10 (×19): 1 mg via INTRAVENOUS
  Filled 2012-08-05 (×19): qty 1

## 2012-08-05 MED ORDER — METOCLOPRAMIDE HCL 5 MG/ML IJ SOLN
10.0000 mg | Freq: Three times a day (TID) | INTRAMUSCULAR | Status: DC
  Administered 2012-08-06 – 2012-08-09 (×13): 10 mg via INTRAVENOUS
  Filled 2012-08-05 (×18): qty 2

## 2012-08-05 NOTE — Progress Notes (Signed)
[facility] PROGRESS NOTE  Makylah Bossard QIO:[id number] DOB: 01/18/1976 DOA: 08/03/2012 PCP: Ahmed Prima, MD  Brief narrative Brief narrative:  37 y.o. 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 Her initial workup in the ER with admitting labs which included CBC CMP were unremarkable. Recent abdominal imaging CT scan on 07/23/2012 was unremarkable. Patient describes the pain as generalized, nonradiating, constant, no aggravating factors relieved with pain medications, associated with nausea vomiting, no diarrhea no blood or mucus in stool pain ongoing for the last 3-4 days according to the patient. Admitted for further management.   Assessment/Plan: Principal Problem:  *Nausea & vomiting Active Problems:  DM (diabetes mellitus), type 2, uncontrolled with complications  Diabetic gastroparesis associated with type 2 diabetes mellitus  Epigastric pain    1. Recurrent/chronic nausea, vomiting and abdominal pain: has been extensively worked up in the past. GI had evaluated her on 1/14 and strongly recommended detox her of narcotics especially in the context of GI dysmotility/gastroparesis. Patient complains of some nausea but no vomiting. She continues to complain of 8/10 upper abdominal pain but does not look in painful discomfort. Passing flatus. Last BM apparently 4 days ago. Lengthy discussion with patient regarding minimizing narcotics. She is  agreeable. Start clear liquids and advance diet as tolerated. Although gastroparesis has been questioned, GES has been normal. Recommended she keep up followup appointment with Dr. Rudean Hitt at [facility] on D/C. 2. DM (diabetes mellitus): This is type 2, and appears too tightly controlled and at risk for hypoglycemia. Given current poor by mouth intake, we'll reduce Lantus to half the home dose and monitor. 3. Hypokalemia: Repleted 4. Hypothyroidism: Continue Synthroid.    Code Status: Full Code.  Family Communication: Discussed with patient. Disposition Plan: Home when able to tolerate by mouth.    Consultants:  None  Procedures:  None   Antibiotics:  None  HPI/Subjective: Nausea but no vomiting. Passing flatus. Last BM 4 days ago. Upper abdominal pain 8/10.  Objective: Vital signs in last 24 hours: Temp:  [98.2 F (36.8 C)-98.8 F (37.1 C)] 98.2 F (36.8 C) (01/22 0547) Pulse Rate:  [79-92] 79  (01/22 0547) Resp:  [18-20] 20  (01/22 0547) BP: (106-125)/(79-83) 106/80 mmHg (01/22 0547) SpO2:  [100 %] 100 % (01/22 0547) Weight:  [90.4 kg (199 lb 4.7 oz)] 90.4 kg (199 lb 4.7 oz) (01/22 0500) Weight change: 4.9 kg (10 lb 12.8 oz) Last BM Date: 08/02/12  Intake/Output from previous day: 01/21 0701 - 01/22 0700 In: 625 [I.V.:625] Out: 1200 [Urine:1200]     Physical Exam:  General: Comfortable, sitting up in bed.  Respiratory system: Clear to auscultation. No increased work of breathing.  Cardiovascular system: First and second heart sounds heard, regular rate and rhythm. No JVD, murmurs or pedal edema.  Gastrointestinal system: Abdomen is nondistended, soft, subjective/inconsistent tenderness. No rigidity, guarding or rebound. Normal bowel sounds heard.  Central nervous system: Alert and oriented. No focal neurological deficits.  Extremities:  Symmetric 5 x 5 power  Lab Results:  Basename 08/05/12 0645 08/03/12 0838  WBC 6.3 7.4  HGB 12.9 14.4    HCT 38.2 41.5  PLT 326 340    Basename 08/05/12 0645 08/04/12 0645  NA 141 138  K 3.7 3.4*  CL 107 103  CO2 27 24  GLUCOSE 80 106*  BUN 9 8  CREATININE 0.69 0.50  CALCIUM 9.0 9.1   No results found for this or any previous visit (from the past 240 hour(s)).   Studies/Results: No results found.  Medications: Scheduled Meds:    . heparin  5,000 Units Subcutaneous Q8H  . hyoscyamine  0.125 mg Sublingual TID AC  . insulin aspart  0-9 Units Subcutaneous Q4H  . insulin glargine  20 Units Subcutaneous BID  . levothyroxine  200 mcg Oral QAC breakfast  . metoCLOPramide  10 mg Oral TID AC & HS  . ondansetron  8 mg Oral Once  . pantoprazole (PROTONIX) IV  40 mg Intravenous Q12H   Continuous Infusions:    . 0.9 % NaCl with KCl 40 mEq / L 125 mL/hr at 08/05/12 1250   PRN Meds:.albuterol, guaiFENesin-dextromethorphan, HYDROcodone-acetaminophen, morphine injection, ondansetron (ZOFRAN) IV, ondansetron, polyethylene glycol, sorbitol    LOS: 2 days   [facility]  [facility] Pager [phone]. If 8PM-8AM, please contact night-coverage at [email or web address], password [facility] 08/05/2012, 2:26 PM  LOS: 2 days

## 2012-08-05 NOTE — Progress Notes (Signed)
Pt tolerated lunch ok. Pt ate icy and drank ginger ale. Pt was asked if she wanted to try full liquids and she said no that she wanted regular food because she is hungry.

## 2012-08-06 LAB — GLUCOSE, CAPILLARY
Glucose-Capillary: 217 mg/dL — ABNORMAL HIGH (ref 70–99)
Glucose-Capillary: 209 mg/dL — ABNORMAL HIGH (ref 70–99)
Glucose-Capillary: 203 mg/dL — ABNORMAL HIGH (ref 70–99)

## 2012-08-06 MED ORDER — SODIUM CHLORIDE 0.9 % IV SOLN
250.0000 mg | Freq: Three times a day (TID) | INTRAVENOUS | Status: AC
  Administered 2012-08-06 – 2012-08-09 (×9): 250 mg via INTRAVENOUS
  Filled 2012-08-06 (×10): qty 250

## 2012-08-06 MED ORDER — LORAZEPAM 2 MG/ML IJ SOLN
1.0000 mg | Freq: Once | INTRAMUSCULAR | Status: AC
  Administered 2012-08-06: 1 mg via INTRAVENOUS
  Filled 2012-08-06: qty 1

## 2012-08-06 MED ORDER — KETOROLAC TROMETHAMINE 30 MG/ML IJ SOLN
30.0000 mg | Freq: Four times a day (QID) | INTRAMUSCULAR | Status: DC
  Administered 2012-08-06 – 2012-08-10 (×16): 30 mg via INTRAVENOUS
  Filled 2012-08-06 (×21): qty 1

## 2012-08-06 NOTE — Consult Note (Signed)
[facility] Consult: 11:08 AM 08/06/2012   Referring Provider: Waymon Amato  Primary Care Physician:  Ahmed Prima, MD Primary Gastroenterologist:  Gentry Fitz,  Seen 1 week ago by Balinda Quails.  Seen and scoped fall 2013 by Elnoria Howard and Loreta Ave.  Followed in [place]  Reason for Consultation:  Recurrent n/v and upper abdominal pain.   Please see the 06/04/13 GI consult  HPI: Marissa Barrett is a 37 y.o. female.  Insulin dependent type 2 diabetic At least 8  admissions to hospital [facility] [place], [place]) since summer 2013 for the exact same set of GI sxs. A repeat head CT, abdominal CT was negative.  Pain in upper abdomen, bilious emesis. Has lost at least 50 #.  Treated with narcotics, though [facility] she went home on no narcotics (she says not taking Percocet though listed on med list at d/c).  Has follow up with [facility] MD: Rudean Hitt on 3/24.   Meds included: Levsin, Reglan , Zofran po. BID Protonix, Phenergen.  Celexa was discontinued, though had not been taking this regularly.   Erythromycin , Bentyl also used in past  Same sxs recurred starting one week ago, 2 days after discharge. Readmitted last night.     Reviewed previous GI workups when Dr Rosina Lowenstein saw her during last admission and these include:  2 EGDs one of which on 05/09/2012 showed patchy gastritis, 2 head CTs, abdominal CTs, ultrasounds, plain films, gastric emptying studies.  She is presumed to have narcotic/diabetic gastroparesis though GES of 07/09/12 was normal.  Doctors in [place] treated +H Pylori with triple therapy.  A celiac profile with TTG and Reticulin Ab was negative 11 days ago Hgb A1c of 06/2012 was 12.3. Lipase and LFTs mostly normal.  However Lipase was up to 80 on 1/9.  Negative pregnancy tests.   Past Medical History  Diagnosis Date  . Interstitial cystitis   . Asthma   . HLD (hyperlipidemia)   . Gastritis     mild per EGD  (04/2012)  . Type II diabetes mellitus 2006    Requiring insulin  . History of blood transfusion 2003    "I was anemic" (07/23/2012)  . GERD (gastroesophageal reflux disease)   . Migraines   . Daily headache   . Anxiety   . Exertional dyspnea     Past Surgical History  Procedure Date  . Esophagogastroduodenoscopy 05/09/2012    Procedure: ESOPHAGOGASTRODUODENOSCOPY (EGD);  Surgeon: Charna Elizabeth, MD;  Location: [facility] ENDOSCOPY;  Service: Endoscopy;  Laterality: N/A;  . Bladder surgery 2007    "opened me up; related to interstitial cystitis" (06/05/2012)  . Abdominal hysterectomy 2004    "Adenomyosis" (07/23/2012)    Prior to Admission medications   Medication Sig Start Date End Date Taking? Authorizing Provider  acetaminophen (TYLENOL) 500 MG tablet Take 1,500 mg by mouth every 6 (six) hours as needed. For pain   Yes Historical Provider, MD  albuterol (PROVENTIL HFA;VENTOLIN HFA) 108 (90 BASE) MCG/ACT inhaler Inhale 2 puffs into the lungs every 6 (six) hours as needed. Wheezing or shortness of breath   Yes Historical Provider, MD  hyoscyamine (LEVSIN SL) 0.125 MG SL tablet Place 0.125 mg under the tongue 3 (three) times daily before meals. 07/28/12  Yes Marinda Elk, MD  insulin glargine (LANTUS) 100 UNIT/ML injection Inject 40 Units into the skin at bedtime. 07/28/12  Yes Marinda Elk, MD  insulin lispro (HUMALOG) 100 UNIT/ML injection Inject 2-10 Units into the skin 4 (four) times  daily -  with meals and at bedtime. Inject 2-10 Units into the skin 4 times daily before meals and nightly. Inject 2-10 Units into the skin 4 times daily before meals and nightly. BG = 70-199  None BG = 200-250 2 units BG = 251-300 4 units BG = 301-350 6 units BG = 351-400 8 units BG > 400  10 units and Call Provider    Hypoglycemia Guidelines: If BG < 70 Give 4oz juice and recheck BG in . 06/26/12  Yes Historical Provider, MD  levothyroxine (SYNTHROID, LEVOTHROID) 200 MCG tablet Take 200 mcg by mouth  Daily. Take 1 tablet (200 mcg total) by mouth daily. On empty stomach 04/30/12  Yes Historical Provider, MD  metoCLOPramide (REGLAN) 10 MG tablet Take 10 mg by mouth 4 (four) times daily.   Yes Historical Provider, MD  ondansetron (ZOFRAN) 8 MG tablet Take 16 mg by mouth every 6 (six) hours as needed. For nausea 07/28/12  Yes Marinda Elk, MD  oxyCODONE-acetaminophen (PERCOCET/ROXICET) 5-325 MG per tablet Take 2 tablets by mouth every 4 (four) hours as needed for pain. 07/31/12  Yes Kaitlyn Szekalski, PA-C  pantoprazole (PROTONIX) 40 MG tablet Take 40 mg by mouth 2 (two) times daily before a meal. 07/28/12  Yes Marinda Elk, MD  polyethylene glycol [facility] Ethelene Hal) packet Take 17 g by mouth Daily. Take 17 g by mouth daily. 06/10/12  Yes Historical Provider, MD  promethazine (PHENERGAN) 25 MG tablet Take 1 tablet (25 mg total) by mouth every 6 (six) hours as needed for nausea. 07/31/12  Yes Kaitlyn Szekalski, PA-C  promethazine (PHENERGAN) 25 MG suppository Place 1 suppository (25 mg total) rectally every 6 (six) hours as needed for nausea. 08/03/12   Olivia Mackie, MD    Scheduled Meds:    . heparin  5,000 Units Subcutaneous Q8H  . hyoscyamine  0.125 mg Sublingual TID AC  . insulin aspart  0-9 Units Subcutaneous Q4H  . insulin glargine  10 Units Subcutaneous BID  . levothyroxine  200 mcg Oral QAC breakfast  . metoCLOPramide (REGLAN) injection  10 mg Intravenous TID AC & HS  . ondansetron  8 mg Oral Once  . pantoprazole (PROTONIX) IV  40 mg Intravenous Q12H   Infusions:    . 0.9 % NaCl with KCl 40 mEq / L 10 mL/hr (08/05/12 1253)   PRN Meds: albuterol, guaiFENesin-dextromethorphan, HYDROcodone-acetaminophen, morphine injection, ondansetron (ZOFRAN) IV, ondansetron, polyethylene glycol, sorbitol   Allergies as of 08/02/2012 - Review Complete 07/31/2012  Allergen Reaction Noted  . Kiwi extract Anaphylaxis, Itching, and Swelling 03/28/2012    Family History  Problem  Relation Age of Onset  . Sarcoidosis Mother   . Hypertension Mother   . Diabetes Mother   . Cerebral aneurysm Maternal Grandmother     History   Social History  . Marital Status: Married    Spouse Name: N/A    Number of Children: 2  . Years of Education: college   Occupational History  . unemployed    Social History Main Topics  . Smoking status: Never Smoker   . Smokeless tobacco: Never Used  . Alcohol Use: No     Comment: rarely  . Drug Use: No  . Sexually Active: Not Currently   Other Topics Concern  . Not on file   Social History Narrative   Lives in [place] with her family.    REVIEW OF SYSTEMS: No oliguria No blood in stool or in emesis No fever or chills  No syncope No vaginal bleeding, no periods as she has had hysterectomy 12 system ROV completed and otherwise unrmarkable   PHYSICAL EXAM: Vital signs in last 24 hours: Temp:  [98.2 F (36.8 C)-98.8 F (37.1 C)] 98.6 F (37 C) (01/23 9811) Pulse Rate:  [78-128] 128  (01/23 0613) Resp:  [16-20] 20  (01/23 0613) BP: (122-164)/(78-100) 162/100 mmHg (01/23 0616) SpO2:  [97 %-100 %] 97 % (01/23 0613) General: depressed, room in total darkness,  head: No asymmetry or trauma  Eyes:. EOMI. No icterus or pallor  Ears: Not HOH  Nose: No congestion or discharge  Mouth: Moist, clear oral MM and pharynx. Good teeth  Neck: No mass, bruits or JVD  Lungs: Clear B  Heart: RRR. No MRG  Abdomen: Soft, moderately obese, active BS, no HSM/masses/bruits. Diffusely tender worse in upper abdomen to just very light pressure. No guard or rebound.  Rectal: scant brown stool is FOB negative. No mass. Rectal tone not diminished.  Musc/Skeltl: no joint deformity or swelling  Extremities: No pedal edema  Neurologic: No tremor, no asterixis. Oriented x 3.  Skin: No rash or sores  Tattoos: None seen  Nodes: No cervical or inguinal adenopathy  Psych: Very flat, depressed affect. Not crying. Answers questions appropriately  and cooperative.     Intake/Output from previous day: 01/22 0701 - 01/23 0700 In: 250 [I.V.:250] Out: 1052 [Urine:650; Emesis/NG output:400; Stool:2] Intake/Output this shift:    LAB RESULTS:  Basename 08/05/12 0645  WBC 6.3  HGB 12.9  HCT 38.2  PLT 326   BMET Lab Results  Component Value Date   NA 141 08/05/2012   NA 138 08/04/2012   NA 134* 08/02/2012   K 3.7 08/05/2012   K 3.4* 08/04/2012   K 3.5 08/02/2012   CL 107 08/05/2012   CL 103 08/04/2012   CL 98 08/02/2012   CO2 27 08/05/2012   CO2 24 08/04/2012   CO2 22 08/02/2012   GLUCOSE 80 08/05/2012   GLUCOSE 106* 08/04/2012   GLUCOSE 219* 08/02/2012   BUN 9 08/05/2012   BUN 8 08/04/2012   BUN 8 08/02/2012   CREATININE 0.69 08/05/2012   CREATININE 0.50 08/04/2012   CREATININE 0.40* 08/03/2012   CALCIUM 9.0 08/05/2012   CALCIUM 9.1 08/04/2012   CALCIUM 9.8 08/02/2012   LFT  Basename 08/05/12 0645 08/04/12 0645  PROT 6.3 6.8  ALBUMIN 2.9* 3.2*  AST 8 10  ALT 7 7  ALKPHOS 60 67  BILITOT 0.5 0.6  BILIDIR -- --  IBILI -- --   PT/INR Lab Results  Component Value Date   INR 1.04 08/03/2012   Drugs of Abuse     Component Value Date/Time   LABOPIA POSITIVE* 08/03/2012 1738   COCAINSCRNUR NONE DETECTED 08/03/2012 1738   LABBENZ NONE DETECTED 08/03/2012 1738   AMPHETMU NONE DETECTED 08/03/2012 1738   THCU NONE DETECTED 08/03/2012 1738   LABBARB NONE DETECTED 08/03/2012 1738     RADIOLOGY STUDIES: Dg Abd Portable 2v  08/06/2012  *RADIOLOGY REPORT*  Clinical Data: Worsening nausea and vomiting.  PORTABLE ABDOMEN - 2 VIEW  Comparison: Abdominal radiograph performed 06/04/2012, and chest and abdominal radiographs performed 05/17/2012  Findings: The lungs are well-aerated and clear.  There is no evidence of focal opacification, pleural effusion or pneumothorax. The cardiomediastinal silhouette is within normal limits.  The visualized bowel gas pattern is unremarkable.  Scattered stool and air are seen within the colon; there is no  evidence of small bowel dilatation to suggest obstruction.  No free intra-abdominal air is identified on the provided upright view.  No acute osseous abnormalities are seen; the sacroiliac joints are unremarkable in appearance.  Postoperative change is noted at the right hemipelvis.  IMPRESSION:  1.  Unremarkable bowel gas pattern; no free intra-abdominal air seen. 2.  No acute cardiopulmonary process identified.   Original Report Authenticated By: Tonia Ghent, M.D.    03/21/12  Abd ultrasound Negative.   03/2012, 07/23/2012  Abd/pelvic CT negative.   ENDOSCOPIC STUDIES: See HPI  IMPRESSION: *  Refractory n/v/ epigastric pain.  Extensive studying since 02/2012 reveal no clear source.    PLAN: *  Per MD.   LOS: 3 days   Jennye Moccasin  08/06/2012, 11:08 AM Pager: [phone]   [facility] Attending  I have also seen and assessed the patient and agree with the above note. She has a complicated situation with recurrent vomiting from gastroparesis and upper abdominal wall pain from the vomiting. Her diabetes is not controlled though Hgb A1C declining. Normal GES puzzling - wonder if she was on metaclopramide then.  1) Stop hyoscyamine 2) IV Toradol 3) local heat 4) IV erythromycin 5) Will follow-up tomorrow 6) needs better control of DM - discussed with patient and mother   Iva Boop, MD, [facility] [phone] (pager) 08/06/2012 4:22 PM

## 2012-08-06 NOTE — Progress Notes (Signed)
[facility] PROGRESS NOTE  Marissa Barrett [id number]:[id number] DOB: [birth year] DOA: 08/03/2012 PCP: Ahmed Prima, MD  Brief narrative Brief narrative:  37 y.o. 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 Her initial workup in the ER with admitting labs which included CBC CMP were unremarkable. Recent abdominal imaging CT scan on 07/23/2012 was unremarkable. Patient describes the pain as generalized, nonradiating, constant, no aggravating factors relieved with pain medications, associated with nausea vomiting, no diarrhea no blood or mucus in stool pain ongoing for the last 3-4 days according to the patient. Admitted for further management.   Assessment/Plan: Principal Problem:  *Nausea & vomiting Active Problems:  DM (diabetes mellitus), type 2, uncontrolled with complications  Diabetic gastroparesis associated with type 2 diabetes mellitus  Epigastric pain  Hypokalemia    1. Recurrent/chronic nausea, vomiting and abdominal pain: has been extensively worked up in the past. GI had evaluated her on 1/14 and strongly recommended detox her of narcotics especially in the context of GI dysmotility/gastroparesis. She continues to complain of upper abdominal pain but does not look in painful discomfort. Passing flatus. Last BM apparently 4 days ago. Although gastroparesis has been questioned, GES has been normal. Recommended she keep up followup appointment with  Dr. Rudean Hitt at [facility] on D/C. Attempted to advance diet on 1/22-tolerated clears but then multiple episodes of nausea, vomiting and retching overnight-looks bilious. Keep on clear liquids.  GI consulted for assistance. 2. DM (diabetes mellitus): This is type 2, and appears too tightly controlled and at risk for hypoglycemia. Given current poor by mouth intake, reduced Lantus to half the home dose and monitor. CBGs in the low 200s. 3. Hypokalemia: Repleted 4. Hypothyroidism: Continue Synthroid.    Code Status: Full Code.  Family Communication: Discussed with patient and spouse at bedside. Disposition Plan: Home when able to tolerate by mouth.    Consultants:  [facility]  Procedures:  None   Antibiotics:  None  HPI/Subjective: Multiple episodes of nausea, vomiting and retching overnight. First episode had food contents that she had for dinner. Subsequent once have been mostly bilious/clear. Complains of feeling anxious and was unable to sleep at night  Objective: Vital signs in last 24 hours: Temp:  [98.2 F (36.8 C)-98.8 F (37.1 C)] 98.6 F (37 C) (01/23 7564) Pulse Rate:  [78-128] 128  (01/23 0613) Resp:  [16-20] 20  (01/23 0613) BP: (122-164)/(78-100) 162/100 mmHg (01/23 0616) SpO2:  [97 %-100 %] 97 % (01/23 3329) Weight change:  Last BM Date: 08/02/12  Intake/Output from previous day: 01/22 0701 - 01/23 0700 In: 250 [I.V.:250] Out: 1052 [Urine:650; Emesis/NG output:400; Stool:2]     Physical Exam:  General: sitting up in bed, bedpan in front of her with bilious content. Slightly anxious but in no distress.  Respiratory system: Clear to auscultation. No increased work of breathing.  Cardiovascular system: First and second heart sounds heard, regular rate and rhythm. No JVD, murmurs or pedal edema.  Gastrointestinal system: Abdomen  is nondistended, soft, mild diffuse tenderness-especially at epigastrium. No rigidity, guarding or rebound.  Normal bowel sounds heard.  Central nervous system: Alert and oriented. No focal neurological deficits.  Extremities: Symmetric 5 x 5 power  Lab Results:  Basename 08/05/12 0645  WBC 6.3  HGB 12.9  HCT 38.2  PLT 326    Basename 08/05/12 0645 08/04/12 0645  NA 141 138  K 3.7 3.4*  CL 107 103  CO2 27 24  GLUCOSE 80 106*  BUN 9 8  CREATININE 0.69 0.50  CALCIUM 9.0 9.1   No results found for this or any previous visit (from the past 240 hour(s)).   Studies/Results: Dg Abd Portable 2v  08/06/2012  *RADIOLOGY REPORT*  Clinical Data: Worsening nausea and vomiting.  PORTABLE ABDOMEN - 2 VIEW  Comparison: Abdominal radiograph performed 06/04/2012, and chest and abdominal radiographs performed 05/17/2012  Findings: The lungs are well-aerated and clear.  There is no evidence of focal opacification, pleural effusion or pneumothorax. The cardiomediastinal silhouette is within normal limits.  The visualized bowel gas pattern is unremarkable.  Scattered stool and air are seen within the colon; there is no evidence of small bowel dilatation to suggest obstruction.  No free intra-abdominal air is identified on the provided upright view.  No acute osseous abnormalities are seen; the sacroiliac joints are unremarkable in appearance.  Postoperative change is noted at the right hemipelvis.  IMPRESSION:  1.  Unremarkable bowel gas pattern; no free intra-abdominal air seen. 2.  No acute cardiopulmonary process identified.   Original Report Authenticated By: Tonia Ghent, M.D.     Medications: Scheduled Meds:    . heparin  5,000 Units Subcutaneous Q8H  . hyoscyamine  0.125 mg Sublingual TID AC  . insulin aspart  0-9 Units Subcutaneous Q4H  . insulin glargine  10 Units Subcutaneous BID  . levothyroxine  200 mcg Oral QAC breakfast  . metoCLOPramide (REGLAN) injection  10 mg Intravenous TID AC & HS  . ondansetron  8 mg Oral Once  . pantoprazole (PROTONIX) IV  40 mg Intravenous Q12H   Continuous  Infusions:    . 0.9 % NaCl with KCl 40 mEq / L 10 mL/hr (08/05/12 1253)   PRN Meds:.albuterol, guaiFENesin-dextromethorphan, HYDROcodone-acetaminophen, morphine injection, ondansetron (ZOFRAN) IV, ondansetron, polyethylene glycol, sorbitol    LOS: 3 days   [facility]  [facility] Pager [phone]. If 8PM-8AM, please contact night-coverage at [email or web address], password [facility] 08/06/2012, 12:54 PM  LOS: 3 days

## 2012-08-06 NOTE — Progress Notes (Signed)
Pt vomited x4 undigested/mucous substance. Nauseated and c/o 10/10 abd pain. Practitioner on call made aware and new orders given. Will follow orders and continue to monitor.

## 2012-08-07 LAB — BASIC METABOLIC PANEL
BUN: 7 mg/dL (ref 6–23)
CO2: 23 mEq/L (ref 19–32)
Calcium: 9.6 mg/dL (ref 8.4–10.5)
Chloride: 97 mEq/L (ref 96–112)
Creatinine, Ser: 0.42 mg/dL — ABNORMAL LOW (ref 0.50–1.10)
GFR calc Af Amer: >90 mL/min (ref >90)

## 2012-08-07 LAB — GLUCOSE, CAPILLARY
Glucose-Capillary: 163 mg/dL — ABNORMAL HIGH (ref 70–99)
Glucose-Capillary: 183 mg/dL — ABNORMAL HIGH (ref 70–99)
Glucose-Capillary: 182 mg/dL — ABNORMAL HIGH (ref 70–99)

## 2012-08-07 MED ORDER — LIDOCAINE 5 % EX PTCH
1.0000 | MEDICATED_PATCH | CUTANEOUS | Status: DC
  Administered 2012-08-07 – 2012-08-09 (×3): 1 via TRANSDERMAL
  Filled 2012-08-07 (×4): qty 1

## 2012-08-07 MED ORDER — LORAZEPAM 2 MG/ML IJ SOLN
1.0000 mg | Freq: Four times a day (QID) | INTRAMUSCULAR | Status: DC | PRN
  Administered 2012-08-07 – 2012-08-09 (×7): 1 mg via INTRAVENOUS
  Filled 2012-08-07 (×7): qty 1

## 2012-08-07 NOTE — Progress Notes (Signed)
Pt's BP - 148/96 and P - 122 @ 1645.  MD notified and ordered to check BP manually in both arms.  This was done and MD notified.  Hector Shade [place]

## 2012-08-07 NOTE — Progress Notes (Addendum)
[facility]  Patient Name: Marissa Barrett Date of Encounter: 08/07/2012, 9:54 AM    Subjective  Still having nausea and vomiting and abdominal pain. Says maybe a bit less than yesterday   Objective    Physical Exam: Filed Vitals:   08/07/12 0612  BP: 148/87  Pulse: 101  Temp: 99.1 F (37.3 C)  Resp: 20   General: Chronically ill, retching, dry heaves in no acute distress. Lungs: Clear bilaterally to auscultation without wheezes, rales, or rhonchi. Breathing is unlabored. Heart: RRR S1 S2 without murmurs, rubs, or gallops.  Abdomen: Soft,  Very tender epigastrium but benign ocerall      Labs:  [facility] 08/07/12 0615 08/05/12 0645  NA 133* 141  K 3.6 3.7  CL 97 107  CO2 23 27  GLUCOSE 170* 80  BUN 7 9  CREATININE 0.42* 0.69  CALCIUM 9.6 9.0  MG -- --  PHOS -- --    Basename 08/05/12 0645  AST 8  ALT 7  ALKPHOS 60  BILITOT 0.5  PROT 6.3  ALBUMIN 2.9*   No results found for this basename: LIPASE:2,AMYLASE:2 in the last 72 hours  Basename 08/05/12 0645  WBC 6.3  NEUTROABS --  HGB 12.9  HCT 38.2  MCV 80.8  PLT 326        Assessment and Plan  1) Diabetic gastroparesis  Continue current therapy Add some lorazepam  2) Epigastric pain - musculoskeletal - abdominal wall pain from vomiting  Add lidoderm patch Reviewed need to brace upper abdomen with pillow Heating pad coming also  Iva Boop, MD, [facility] [phone] (pager) 08/07/2012 9:57 AM

## 2012-08-07 NOTE — Progress Notes (Signed)
[facility] PROGRESS NOTE  Elie Gragert VWU:[id number] DOB: [birth year] DOA: 08/03/2012 PCP: Ahmed Prima, MD  Brief narrative Brief narrative:  37 y.o. 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 Her initial workup in the ER with admitting labs which included CBC CMP were unremarkable. Recent abdominal imaging CT scan on 07/23/2012 was unremarkable. Patient describes the pain as generalized, nonradiating, constant, no aggravating factors relieved with pain medications, associated with nausea vomiting, no diarrhea no blood or mucus in stool pain ongoing for the last 3-4 days according to the patient. Admitted for further management.   Assessment/Plan: Principal Problem:  *Nausea & vomiting Active Problems:  DM (diabetes mellitus), type 2, uncontrolled with complications  Diabetic gastroparesis associated with type 2 diabetes mellitus  Epigastric pain  Hypokalemia    1. Recurrent/chronic nausea, vomiting and abdominal pain: has been extensively worked up in the past. GI had evaluated her on 1/14 and strongly recommended detox her of narcotics especially in the context of GI dysmotility/gastroparesis. She continues to complain of upper abdominal pain but does not look in painful discomfort. Passing flatus. Last BM apparently 4 days ago. Although gastroparesis has been questioned, GES has been normal. Recommended she keep up followup appointment with  Dr. Rudean Hitt at [facility] on D/C. Attempted to advance diet on 1/22-tolerated clears but then multiple episodes of nausea, vomiting and retching. [facility] consulted-input appreciated. GI indicates diabetic gastroparesis and musculoskeletal epigastric pain. IV azithromycin, ketorolac and Lidoderm patch added to IV Reglan and Protonix. Topical heating pad. Vomiting is slightly better. Still complains of nausea and abdominal pain 2. DM (diabetes mellitus): This is type 2, and appears too tightly controlled and at risk for hypoglycemia. Given current poor by mouth intake, reduced Lantus to half the home dose and monitor. CBGs in the mid 100s. 3. Hypokalemia: Repleted 4. Hypothyroidism: Continue Synthroid.    Code Status: Full Code.  Family Communication: Discussed with patient and spouse at bedside. Disposition Plan: Home when able to tolerate by mouth.    Consultants:  [facility]  Procedures:  None   Antibiotics:  None  HPI/Subjective: Vomiting slightly better. Still nauseous and complains of abdominal pain  Objective: Vital signs in last 24 hours: Temp:  [98.6 F (37 C)-99.1 F (37.3 C)] 98.9 F (37.2 C) (01/24 1408) Pulse Rate:  [101-121] 116  (01/24 1408) Resp:  [18-20] 20  (01/24 1408) BP: (148-173)/(87-102) 173/95 mmHg (01/24 1408) SpO2:  [99 %-100 %] 99 % (01/24 1408) Weight:  [84.5 kg (186 lb 4.6 oz)] 84.5 kg (186 lb 4.6 oz) (01/24 0612) Weight change:  Last BM Date: 08/02/12  Intake/Output from previous day: 01/23 0701 - 01/24 0700 In: 1275.1 [I.V.:1275.1] Out: -      Physical Exam:  General: No obvious distress.  Respiratory system: Clear to auscultation. No increased work of breathing.  Cardiovascular system: First and second heart sounds heard, regular rate and rhythm. No JVD, murmurs or pedal edema.  Gastrointestinal system: Abdomen  is nondistended, soft, mild diffuse tenderness-especially at epigastrium-? Subjective/inconsistent. No  rigidity, guarding or rebound. Normal bowel sounds heard.  Central nervous system: Alert and oriented. No focal neurological deficits.  Extremities: Symmetric 5 x 5 power  Lab Results:  Basename 08/05/12 0645  WBC 6.3  HGB 12.9  HCT 38.2  PLT 326    Basename 08/07/12 0615 08/05/12 0645  NA 133* 141  K 3.6 3.7  CL 97 107  CO2 23 27  GLUCOSE 170* 80  BUN 7 9  CREATININE 0.42* 0.69  CALCIUM 9.6 9.0   No results found for this or any previous visit (from the past 240 hour(s)).   Studies/Results: Dg Abd Portable 2v  08/06/2012  *RADIOLOGY REPORT*  Clinical Data: Worsening nausea and vomiting.  PORTABLE ABDOMEN - 2 VIEW  Comparison: Abdominal radiograph performed 06/04/2012, and chest and abdominal radiographs performed 05/17/2012  Findings: The lungs are well-aerated and clear.  There is no evidence of focal opacification, pleural effusion or pneumothorax. The cardiomediastinal silhouette is within normal limits.  The visualized bowel gas pattern is unremarkable.  Scattered stool and air are seen within the colon; there is no evidence of small bowel dilatation to suggest obstruction.  No free intra-abdominal air is identified on the provided upright view.  No acute osseous abnormalities are seen; the sacroiliac joints are unremarkable in appearance.  Postoperative change is noted at the right hemipelvis.  IMPRESSION:  1.  Unremarkable bowel gas pattern; no free intra-abdominal air seen. 2.  No acute cardiopulmonary process identified.   Original Report Authenticated By: Tonia Ghent, M.D.     Medications: Scheduled Meds:    . erythromycin  250 mg Intravenous Q8H  . heparin  5,000 Units Subcutaneous Q8H  . insulin aspart  0-9 Units Subcutaneous Q4H  . insulin glargine  10 Units Subcutaneous BID  . ketorolac  30 mg Intravenous Q6H  . levothyroxine  200 mcg Oral QAC breakfast  . lidocaine  1 patch Transdermal Q24H  . metoCLOPramide (REGLAN) injection  10 mg Intravenous TID AC & HS   . pantoprazole (PROTONIX) IV  40 mg Intravenous Q12H   Continuous Infusions:    . 0.9 % NaCl with KCl 40 mEq / L 75 mL/hr at 08/07/12 0839   PRN Meds:.albuterol, HYDROcodone-acetaminophen, LORazepam, morphine injection, ondansetron (ZOFRAN) IV, ondansetron, polyethylene glycol, sorbitol    LOS: 4 days   [facility]  [facility] Pager [phone]. If 8PM-8AM, please contact night-coverage at [email or web address], password [facility] 08/07/2012, 2:43 PM  LOS: 4 days

## 2012-08-08 LAB — GLUCOSE, CAPILLARY
Glucose-Capillary: 98 mg/dL (ref 70–99)
Glucose-Capillary: 94 mg/dL (ref 70–99)
Glucose-Capillary: 85 mg/dL (ref 70–99)
Glucose-Capillary: 96 mg/dL (ref 70–99)

## 2012-08-08 NOTE — Progress Notes (Signed)
[facility] PROGRESS NOTE  Marissa Barrett ZOX:[id number] DOB: [birth year] DOA: 08/03/2012 PCP: Ahmed Prima, MD  Brief narrative Brief narrative:  37 y.o. 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 Her initial workup in the ER with admitting labs which included CBC CMP were unremarkable. Recent abdominal imaging CT scan on 07/23/2012 was unremarkable. Patient describes the pain as generalized, nonradiating, constant, no aggravating factors relieved with pain medications, associated with nausea vomiting, no diarrhea no blood or mucus in stool pain ongoing for the last 3-4 days according to the patient. Admitted for further management.   Assessment/Plan: Principal Problem:  *Nausea & vomiting Active Problems:  DM (diabetes mellitus), type 2, uncontrolled with complications  Diabetic gastroparesis associated with type 2 diabetes mellitus  Epigastric pain  Hypokalemia    1. Recurrent/chronic nausea, vomiting and abdominal pain: has been extensively worked up in the past. GI had evaluated her on 1/14 and strongly recommended detox her of narcotics especially in the context of GI dysmotility/gastroparesis. Although gastroparesis has been questioned, GES has been normal. Recommended she keep up followup appointment with Dr. Rudean Hitt at [facility] on D/C. Attempted to advance diet on 1/22-tolerated clears but then multiple episodes of nausea,  vomiting and retching. [facility] consulted-input appreciated. GI indicates diabetic gastroparesis and musculoskeletal epigastric pain. IV azithromycin, ketorolac and Lidoderm patch added to IV Reglan and Protonix. Topical heating pad. No Vomiting- tolerating clears. Still complains of nausea and abdominal pain- slightly better. 2. DM (diabetes mellitus): This is type 2, and appears too tightly controlled and at risk for hypoglycemia. Given current poor by mouth intake, reduced Lantus to half the home dose and monitor. CBGs in high 90's. 3. Hypokalemia: Repleted 4. Hypothyroidism: Continue Synthroid. 5. Elevated BP: could be from pain. Monitor off medications for now.    Code Status: Full Code.  Family Communication: Discussed with patient. Disposition Plan: Home when able to tolerate by mouth.    Consultants:  [facility]  Procedures:  None   Antibiotics:  IV Erythromycin 1/23> (for gastroparesis)  HPI/Subjective: No Vomiting- tolerating clears. Still complains of nausea and abdominal pain- slightly better.  Objective: Vital signs in last 24 hours: Temp:  [97.9 F (36.6 C)-98.9 F (37.2 C)] 97.9 F (36.6 C) (01/25 0625) Pulse Rate:  [94-122] 94  (01/25 0625) Resp:  [20] 20  (01/25 0625) BP: (105-173)/(69-106) 117/84 mmHg (01/25 0625) SpO2:  [97 %-100 %] 97 % (01/25 0625) Weight change:  Last BM Date: 08/02/12  Intake/Output from previous day:       Physical Exam:  General: No obvious distress. Ambulating halls with family.  Respiratory system: Clear to auscultation. No increased work of breathing.  Cardiovascular system: First and second heart sounds heard, regular rate and rhythm. No JVD, murmurs or pedal edema.  Gastrointestinal system: Abdomen is ? Mildly distended, soft, mild diffuse tenderness-especially at epigastrium-? Subjective/inconsistent. No rigidity, guarding or rebound. Normal bowel sounds heard.  Central nervous system: Alert and oriented. No  focal neurological deficits.  Extremities: Symmetric 5 x 5 power  Lab Results: No results found for this basename: WBC:2,HGB:2,HCT:2,PLT:2 in the last 72 hours  Basename 08/07/12 0615  NA 133*  K 3.6  CL 97  CO2 23  GLUCOSE 170*  BUN 7  CREATININE 0.42*  CALCIUM 9.6   No results found for this or any previous visit (from the past 240 hour(s)).   Studies/Results: No results found.  Medications: Scheduled Meds:    . erythromycin  250 mg Intravenous Q8H  . heparin  5,000 Units Subcutaneous Q8H  . insulin aspart  0-9 Units Subcutaneous Q4H  . insulin glargine  10 Units Subcutaneous BID  . ketorolac  30 mg Intravenous Q6H  . levothyroxine  200 mcg Oral QAC breakfast  . lidocaine  1 patch Transdermal Q24H  . metoCLOPramide (REGLAN) injection  10 mg Intravenous TID AC & HS  . pantoprazole (PROTONIX) IV  40 mg Intravenous Q12H   Continuous Infusions:    . 0.9 % NaCl with KCl 40 mEq / L 75 mL/hr at 08/07/12 2259   PRN Meds:.albuterol, HYDROcodone-acetaminophen, LORazepam, morphine injection, ondansetron (ZOFRAN) IV, ondansetron, polyethylene glycol, sorbitol    LOS: 5 days   [facility]  [facility] Pager [phone]. If 8PM-8AM, please contact night-coverage at [email or web address], password [facility] 08/08/2012, 11:33 AM  LOS: 5 days

## 2012-08-08 NOTE — Progress Notes (Signed)
[facility]  Patient Name: Yesena Reaves Date of Encounter: 08/08/2012, 12:40 PM    Subjective  Still having nausea, no vomiting, less abdominal pain. Walked the halls Slept all night   Objective    Physical Exam: Filed Vitals:   08/08/12 0625  BP: 117/84  Pulse: 94  Temp: 97.9 F (36.6 C)  Resp: 20   General: Chronically ill,but better appearance, looks somewhat rested. Abdomen: Soft,  less tender epigastrium but benign overall      .    Assessment and Plan  1) Diabetic gastroparesis  Continue current therapy Seems better today Emycin 7-10 d max  2) Epigastric pain - musculoskeletal - abdominal wall pain from vomiting  Better w. Lidoderm, Toradol, heating pad   She is improving! I think lorazepam helping also  If she continues to improve will try to transition to orals She is going to need gastroparesis diet education - not yet ordered  Iva Boop, MD, [facility] [phone] (pager) 08/08/2012 12:40 PM

## 2012-08-09 LAB — GLUCOSE, CAPILLARY
Glucose-Capillary: 70 mg/dL (ref 70–99)
Glucose-Capillary: 87 mg/dL (ref 70–99)
Glucose-Capillary: 141 mg/dL — ABNORMAL HIGH (ref 70–99)

## 2012-08-09 MED ORDER — INSULIN GLARGINE 100 UNIT/ML ~~LOC~~ SOLN
5.0000 [IU] | Freq: Two times a day (BID) | SUBCUTANEOUS | Status: DC
  Administered 2012-08-09 – 2012-08-10 (×2): 5 [IU] via SUBCUTANEOUS

## 2012-08-09 MED ORDER — LORAZEPAM 1 MG PO TABS
1.0000 mg | ORAL_TABLET | Freq: Three times a day (TID) | ORAL | Status: DC | PRN
  Administered 2012-08-09 – 2012-08-10 (×2): 1 mg via ORAL
  Filled 2012-08-09 (×2): qty 1

## 2012-08-09 MED ORDER — BISACODYL 10 MG RE SUPP
10.0000 mg | Freq: Once | RECTAL | Status: AC
  Administered 2012-08-09: 10 mg via RECTAL
  Filled 2012-08-09: qty 1

## 2012-08-09 MED ORDER — ERYTHROMYCIN BASE 250 MG PO TBEC
250.0000 mg | DELAYED_RELEASE_TABLET | Freq: Three times a day (TID) | ORAL | Status: DC
  Administered 2012-08-09 – 2012-08-10 (×3): 250 mg via ORAL
  Filled 2012-08-09 (×6): qty 1

## 2012-08-09 MED ORDER — METOCLOPRAMIDE HCL 5 MG/5ML PO SOLN
10.0000 mg | Freq: Three times a day (TID) | ORAL | Status: DC
  Administered 2012-08-09 – 2012-08-10 (×5): 10 mg via ORAL
  Filled 2012-08-09 (×8): qty 10

## 2012-08-09 MED ORDER — PANTOPRAZOLE SODIUM 40 MG PO TBEC
40.0000 mg | DELAYED_RELEASE_TABLET | Freq: Two times a day (BID) | ORAL | Status: DC
  Administered 2012-08-09 – 2012-08-10 (×2): 40 mg via ORAL
  Filled 2012-08-09 (×2): qty 1

## 2012-08-09 NOTE — Progress Notes (Signed)
[facility] PROGRESS NOTE  Marissa Barrett UJW:[id number] DOB: 05-24-1976 DOA: 08/03/2012 PCP: Ahmed Prima, MD  Brief narrative Brief narrative:  37 y.o. 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 Her initial workup in the ER with admitting labs which included CBC CMP were unremarkable. Recent abdominal imaging CT scan on 07/23/2012 was unremarkable. Patient describes the pain as generalized, nonradiating, constant, no aggravating factors relieved with pain medications, associated with nausea vomiting, no diarrhea no blood or mucus in stool pain ongoing for the last 3-4 days according to the patient. Admitted for further management.   Assessment/Plan: Principal Problem:  *Nausea & vomiting Active Problems:  DM (diabetes mellitus), type 2, uncontrolled with complications  Diabetic gastroparesis associated with type 2 diabetes mellitus  Epigastric pain  Hypokalemia    1. Recurrent/chronic nausea, vomiting and abdominal pain: has been extensively worked up in the past. GI had evaluated her on 1/14 and strongly recommended detox her of narcotics especially in the context of GI dysmotility/gastroparesis. Although gastroparesis has been questioned, GES has been normal. Recommended she keep up followup appointment with Dr. Rudean Hitt at [facility] on D/C. Attempted to advance diet on 1/22-tolerated clears but then multiple episodes of nausea,  vomiting and retching. [facility] consulted-input appreciated. GI indicates diabetic gastroparesis and musculoskeletal epigastric pain. No further nausea or vomiting. Tolerating liquid diet and willing to advance. Flatus but no BM for approximately a week. GI saw and made medication changes.  2. DM (diabetes mellitus): This is type 2, and appears too tightly controlled and at risk for hypoglycemia. Given current poor by mouth intake, reduced Lantus. CBGs in high 70s-80s. 3. Hypokalemia: Repleted 4. Hypothyroidism: Continue Synthroid. 5. Elevated BP: could be from pain. Monitor off medications for now. Controlled.    Code Status: Full Code.  Family Communication: Discussed with patient and spouse. Disposition Plan: Home when able to tolerate by mouth. Possible discharge home on 1/27.    Consultants:  [facility]  Procedures:  None   Antibiotics:  IV Erythromycin 1/23> (for gastroparesis)  HPI/Subjective: No nausea or vomiting- tolerating liquids. Abdominal pain decreasing/mild. Flatus +. No BM.  Objective: Vital signs in last 24 hours: Temp:  [98.3 F (36.8 C)-98.6 F (37 C)] 98.4 F (36.9 C) (01/26 1409) Pulse Rate:  [87-95] 95  (01/26 1409) Resp:  [17-20] 17  (01/26 1409) BP: (106-114)/(66-69) 106/69 mmHg (01/26 1409) SpO2:  [99 %-100 %] 99 % (01/26 1409) Weight:  [89.6 kg (197 lb 8.5 oz)] 89.6 kg (197 lb 8.5 oz) (01/26 0510) Weight change:  Last BM Date: 08/02/12  Intake/Output from previous day: 01/25 0701 - 01/26 0700 In: 2643 [P.O.:390; I.V.:2037; IV Piggyback:216] Out: 1000 [Urine:1000] Total I/O In: 840 [P.O.:240; I.V.:600] Out: -    Physical Exam:  General: No obvious distress. Looks much better than she did 2-3 days ago.  Respiratory system: Clear to auscultation. No increased work of breathing.  Cardiovascular system: First and second heart sounds heard, regular rate  and rhythm. No JVD, murmurs or pedal edema.  Gastrointestinal system: Abdomen is  non-distended, soft, minimal epigastric tenderness. No rigidity, guarding or rebound. Normal bowel sounds heard.  Central nervous system: Alert and oriented. No focal neurological deficits.  Extremities: Symmetric 5 x 5 power  Lab Results: No results found for this basename: WBC:2,HGB:2,HCT:2,PLT:2 in the last 72 hours  Basename 08/07/12 0615  NA 133*  K 3.6  CL 97  CO2 23  GLUCOSE 170*  BUN 7  CREATININE 0.42*  CALCIUM 9.6   No results found for this or any previous visit (from the past 240 hour(s)).   Studies/Results: No results found.  Medications: Scheduled Meds:    . erythromycin  250 mg Oral Q8H  . heparin  5,000 Units Subcutaneous Q8H  . insulin aspart  0-9 Units Subcutaneous Q4H  . insulin glargine  10 Units Subcutaneous BID  . ketorolac  30 mg Intravenous Q6H  . levothyroxine  200 mcg Oral QAC breakfast  . lidocaine  1 patch Transdermal Q24H  . metoCLOPramide  10 mg Oral TID AC & HS  . pantoprazole  40 mg Oral BID AC   Continuous Infusions:    . 0.9 % NaCl with KCl 40 mEq / L 75 mL/hr at 08/09/12 0009   PRN Meds:.albuterol, HYDROcodone-acetaminophen, LORazepam, morphine injection, ondansetron (ZOFRAN) IV, ondansetron, polyethylene glycol, sorbitol    LOS: 6 days   [facility]  [facility] Pager [phone]. If 8PM-8AM, please contact night-coverage at [email or web address], password [facility] 08/09/2012, 3:52 PM  LOS: 6 days

## 2012-08-09 NOTE — Progress Notes (Signed)
[facility]  Patient Name: Marissa Barrett Date of Encounter: 08/09/2012, 12:41 PM    Subjective  less abdominal pain "a little" - no nausea and vomiting, tolerating clears, + flatus   Objective    Physical Exam: Filed Vitals:   08/09/12 0510  BP: 114/66  Pulse: 87  Temp: 98.3 F (36.8 C)  Resp: 20   General: Chronically ill,but better appearance, looks somewhat rested. Abdomen: Soft,  less tender epigastrium but benign overall      .    Assessment and Plan  1) Diabetic gastroparesis - much better  Change to oral liquid metaclopramide and erythromycin pills Advance diet She says she knows about gastroparesis diet Hgb A1 C is better c/w few months ago   2) Epigastric pain - musculoskeletal - abdominal wall pain from vomiting  Better w. Lidoderm, Toradol, heating pad  Continue these meds here When goes home would try topical NSAID e.g Voltaren and could try Lidoderm patch but suspect it will not be covered by Medicaid - neither may Voltaren - can use OTC salicylate sportscreme instead Agree that decreasing narcotics is an overall goal  Could also send home on some lorazepam oral prn if desired - I bet this helps her I would also rx prochloperazine 25 mg suppositories to use if cannot tolerate oral meds  GI f/u at [facility] is planned I believe  Signing off - discussed w/ Dr. Franchot Gallo, MD, [facility] [phone] (pager) 08/09/2012 12:41 PM

## 2012-08-10 LAB — GLUCOSE, CAPILLARY
Glucose-Capillary: 86 mg/dL (ref 70–99)
Glucose-Capillary: 110 mg/dL — ABNORMAL HIGH (ref 70–99)

## 2012-08-10 MED ORDER — ONDANSETRON HCL 8 MG PO TABS: 8.0000 mg | ORAL_TABLET | Freq: Three times a day (TID) | ORAL | Status: DC | PRN

## 2012-08-10 MED ORDER — ERYTHROMYCIN BASE 250 MG PO TBEC: 250.0000 mg | DELAYED_RELEASE_TABLET | Freq: Three times a day (TID) | ORAL | Status: DC

## 2012-08-10 MED ORDER — PROMETHAZINE HCL 25 MG RE SUPP: 25.0000 mg | Freq: Four times a day (QID) | RECTAL | Status: DC | PRN

## 2012-08-10 MED ORDER — OXYCODONE-ACETAMINOPHEN 5-325 MG PO TABS: 1.0000 | ORAL_TABLET | ORAL | Status: DC | PRN

## 2012-08-10 MED ORDER — METOCLOPRAMIDE HCL 10 MG PO TABS: 10.0000 mg | ORAL_TABLET | Freq: Three times a day (TID) | ORAL | Status: DC

## 2012-08-10 MED ORDER — DICLOFENAC SODIUM 1 % TD GEL: 2.0000 g | Freq: Four times a day (QID) | TRANSDERMAL | Status: DC | PRN

## 2012-08-10 MED ORDER — LIDOCAINE 5 % EX PTCH: 1.0000 | MEDICATED_PATCH | CUTANEOUS | Status: DC

## 2012-08-10 MED ORDER — INSULIN GLARGINE 100 UNIT/ML ~~LOC~~ SOLN: 10.0000 [IU] | Freq: Every day | SUBCUTANEOUS | Status: DC

## 2012-08-10 NOTE — Progress Notes (Signed)
Patient discharged to home with husband.  Discharge teaching completed including follow up care, medications, diet and activity.  Verbalizes understanding with no further questions.  Vital signs stable, tolerating regular diet.  Discharged per wheelchair with husband.

## 2012-08-10 NOTE — Discharge Summary (Signed)
Physician Discharge Summary  Vanita Cannell ZOX:[id number] DOB: [birth year] DOA: 08/03/2012  PCP: Ahmed Prima, MD  Admit date: 08/03/2012 Discharge date: 08/10/2012  Time spent: Greater than 30 minutes  Recommendations for Outpatient Follow-up:  1. With Dr. Kelton Pillar, GI at [facility] on 08/11/12 at 9:30 AM-prior appointment 2. With Dr. Ahmed Prima, PCP in one week from hospital discharge.  Discharge Diagnoses:  Principal Problem:  *Nausea & vomiting Active Problems:  DM (diabetes mellitus), type 2, uncontrolled with complications  Diabetic gastroparesis associated with type 2 diabetes mellitus  Epigastric pain  Hypokalemia   Discharge Condition: Improved and stable.  Diet recommendation: Gastroparesis diabetic diet  Filed Weights   08/07/12 0612 08/09/12 0510 08/10/12 0500  Weight: 84.5 kg (186 lb 4.6 oz) 89.6 kg (197 lb 8.5 oz) 87.9 kg (193 lb 12.6 oz)    History of present illness:  37 y.o. 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 Her initial workup in the ER with admitting labs which included CBC CMP were unremarkable. Recent abdominal imaging CT scan on 07/23/2012 was unremarkable. Patient describes the pain as generalized, nonradiating, constant, no aggravating factors relieved with pain medications, associated with nausea vomiting, no diarrhea no blood or mucus in stool pain ongoing for the last 3-4 days according  to the patient. Admitted for further management.   Hospital Course:  1. Recurrent/chronic nausea, vomiting and abdominal pain: has been extensively worked up in the past. GI had evaluated her on 1/14 and strongly recommended detox her of narcotics especially in the context of GI dysmotility/gastroparesis. Attempted to advance diet on 1/22- but had multiple episodes of nausea, vomiting and retching. [facility] consulted. They opined that she had diabetic gastroparesis and musculoskeletal epigastric pain. She was placed on IV Toradol, heating pad, IV Exythromycin, ativan prn for anxiety, changed Protonix and Reglan to IV. Over the next 3-4 days, she gradually improved and her diet was advanced. She has no further nausea or vomiting. She is tolerating regular consistency diet. She is aware of Gastroparesis diet. Had BM yesterday after Dulcolax suppository. She was changed to oral and topical medications. She is advised to keep appointment with GI MD at [facility]. 2. DM (diabetes mellitus): This is type 2, and appears too tightly controlled and at risk for hypoglycemia. Given current poor by mouth intake, reduced Lantus. Her Insulins can be titrated as OP based of her CBG's. 3. Hypokalemia: Repleted 4. Hypothyroidism: Continue Synthroid. 5. Elevated BP: could be from pain. Monitor off medications for now. Controlled.  Consultants:  [facility]  Procedures:  None   Discharge Exam:  Denies nausea or vomiting. Still complains of upper abdominal pain but is significantly better than 3-4 days ago. Had small BM after Dulcolax suppository last night. Tolerating regular consistency diet.  Filed Vitals:   08/09/12 1409 08/09/12 2119 08/10/12 0500 08/10/12 0539  BP: 106/69 100/55  110/68  Pulse: 95 92  76  Temp: 98.4 F (36.9 C) 98.6  F (37 C)  97.9 F (36.6 C)  TempSrc: Oral Oral  Oral  Resp: 17 18  16   Height:      Weight:   87.9 kg (193 lb 12.6 oz)   SpO2: 99% 100%  100%    General: No  obvious distress. Looks much better than she did 2-3 days ago.  Respiratory system: Clear to auscultation. No increased work of breathing.  Cardiovascular system: First and second heart sounds heard, regular rate and rhythm. No JVD, murmurs or pedal edema.  Gastrointestinal system: Abdomen is non-distended, soft, minimal epigastric tenderness. No rigidity, guarding or rebound. Normal bowel sounds heard.  Central nervous system: Alert and oriented. No focal neurological deficits.  Extremities: Symmetric 5 x 5 power  Discharge Instructions      Discharge Orders    Future Orders Please Complete By Expires   Increase activity slowly      Discharge instructions      Comments:   DIET: Gastroparesis Diabetic diet.   Call MD for:  persistant nausea and vomiting      Call MD for:  severe uncontrolled pain          Medication List     As of 08/10/2012 11:21 AM    STOP taking these medications         acetaminophen 500 MG tablet   Commonly known as: TYLENOL      hyoscyamine 0.125 MG SL tablet   Commonly known as: LEVSIN SL      promethazine 25 MG tablet   Commonly known as: PHENERGAN      TAKE these medications         albuterol 108 (90 BASE) MCG/ACT inhaler   Commonly known as: PROVENTIL HFA;VENTOLIN HFA   Inhale 2 puffs into the lungs every 6 (six) hours as needed. Wheezing or shortness of breath      diclofenac sodium 1 % Gel   Commonly known as: VOLTAREN   Apply 2 g topically 4 (four) times daily as needed (apply to upper abdominal wall as needed for pain.).      erythromycin 250 MG EC tablet   Commonly known as: ERY-TAB   Take 1 tablet (250 mg total) by mouth every 8 (eight) hours.      insulin glargine 100 UNIT/ML injection   Commonly known as: LANTUS   Inject 10 Units into the skin at bedtime.      insulin lispro 100 UNIT/ML injection   Commonly known as: HUMALOG   Inject 2-10 Units into the skin 4 (four) times daily -  with meals and at bedtime. Inject 2-10 Units  into the skin 4 times daily before meals and nightly. Inject 2-10 Units into the skin 4 times daily before meals and nightly. BG = 70-199  None BG = 200-250 2 units BG = 251-300 4 units BG = 301-350 6 units BG = 351-400 8 units BG > 400  10 units and Call Provider    Hypoglycemia Guidelines: If BG < 70 Give 4oz juice and recheck BG in .      levothyroxine 200 MCG tablet   Commonly known as: SYNTHROID, LEVOTHROID   Take 200 mcg by mouth Daily. Take 1 tablet (200 mcg total) by mouth daily. On empty stomach      lidocaine 5 %   Commonly known as: LIDODERM   Place 1 patch onto the skin daily. Remove & Discard patch within 12 hours or as directed by MD  metoCLOPramide 10 MG tablet   Commonly known as: REGLAN   Take 1 tablet (10 mg total) by mouth 4 (four) times daily -  before meals and at bedtime.      ondansetron 8 MG tablet   Commonly known as: ZOFRAN   Take 1 tablet (8 mg total) by mouth every 8 (eight) hours as needed for nausea.      oxyCODONE-acetaminophen 5-325 MG per tablet   Commonly known as: PERCOCET/ROXICET   Take 1 tablet by mouth every 4 (four) hours as needed for pain.      pantoprazole 40 MG tablet   Commonly known as: PROTONIX   Take 40 mg by mouth 2 (two) times daily before a meal.      polyethylene glycol packet   Commonly known as: MIRALAX / GLYCOLAX   Take 17 g by mouth Daily. Take 17 g by mouth daily.      promethazine 25 MG suppository   Commonly known as: PHENERGAN   Place 1 suppository (25 mg total) rectally every 6 (six) hours as needed (for nausea & vomiting not releaved by Zofran.).        Follow-up Information    Follow up with Dr. Kelton Pillar at [facility]. On 08/11/2012. (9:30 am)       Follow up with LEE,HOLLY, MD. Schedule an appointment as soon as possible for a visit in 1 week.   Contact information:   [address] [place] [address] [phone]           The results of significant diagnostics from this  hospitalization (including imaging, microbiology, ancillary and laboratory) are listed below for reference.    Significant Diagnostic Studies: Ct Head Wo Contrast  07/27/2012  *RADIOLOGY REPORT*  Clinical Data:  Nausea vomiting and headache  CT HEAD WITHOUT CONTRAST  Technique:  Contiguous axial images were obtained from the base of the skull through the vertex without contrast  Comparison:  None.  Findings:  The brain has a normal appearance without evidence for hemorrhage, acute infarction, hydrocephalus, or mass lesion.  There is no extra axial fluid collection.  The skull and paranasal sinuses are normal.  IMPRESSION: Normal CT of the head without contrast.   Original Report Authenticated By: Janeece Riggers, M.D.    Ct Abdomen Pelvis W Contrast  07/23/2012  *RADIOLOGY REPORT*  Clinical Data: Abdominal pain. Nausea and vomiting.  Type 2 diabetes.  CT ABDOMEN AND PELVIS WITH CONTRAST  Technique:  Multidetector CT imaging of the abdomen and pelvis was performed following the standard protocol during bolus administration of intravenous contrast.  Contrast: OMNIPAQUE IOHEXOL 300 MG/ML  SOLN  Comparison: Prior CT abdomen 03/21/2012.  Findings: Increased body habitus. Unremarkable liver.  No focal hepatic defects or dilated ducts.  Normal gallbladder appearance by CT.  No visible ascites.  Kidneys are symmetric in enhancement and contour without visible masses or hydronephrosis.  No perinephric stranding or visible calculi.  Normal appearance to the bladder, with unremarkable adrenal glands, pancreas, and spleen.  There is no aneurysmal dilatation of the abdominal aorta.  No retroperitoneal adenopathy.  Unremarkable appearing stomach, small bowel, and colon.   No visible appendiceal inflammation.  No pneumoperitoneum, pneumatosis, or free pelvic fluid. Slight enlarged bilateral iliac and inguinal lymph nodes are unchanged from priors.  Unremarkable appearing pelvis post hysterectomy.  No adnexal lesion.  No  aggressive osseous lesions.  Normal heart size with clear lung bases.  IMPRESSION: Unremarkable appearing CT abdomen and pelvis.  No inflammatory process,  bowel obstruction, or solid organ abnormality.   Original Report Authenticated By: Davonna Belling, M.D.    Dg Abd Portable 2v  08/06/2012  *RADIOLOGY REPORT*  Clinical Data: Worsening nausea and vomiting.  PORTABLE ABDOMEN - 2 VIEW  Comparison: Abdominal radiograph performed 06/04/2012, and chest and abdominal radiographs performed 05/17/2012  Findings: The lungs are well-aerated and clear.  There is no evidence of focal opacification, pleural effusion or pneumothorax. The cardiomediastinal silhouette is within normal limits.  The visualized bowel gas pattern is unremarkable.  Scattered stool and air are seen within the colon; there is no evidence of small bowel dilatation to suggest obstruction.  No free intra-abdominal air is identified on the provided upright view.  No acute osseous abnormalities are seen; the sacroiliac joints are unremarkable in appearance.  Postoperative change is noted at the right hemipelvis.  IMPRESSION:  1.  Unremarkable bowel gas pattern; no free intra-abdominal air seen. 2.  No acute cardiopulmonary process identified.   Original Report Authenticated By: Tonia Ghent, M.D.     Microbiology: No results found for this or any previous visit (from the past 240 hour(s)).   Labs: Basic Metabolic Panel:  Lab 08/07/12 4098 08/05/12 0645 08/04/12 0645  NA 133* 141 138  K 3.6 3.7 3.4*  CL 97 107 103  CO2 23 27 24   GLUCOSE 170* 80 106*  BUN 7 9 8   CREATININE 0.42* 0.69 0.50  CALCIUM 9.6 9.0 9.1  MG -- -- --  PHOS -- -- --   Liver Function Tests:  Lab 08/05/12 0645 08/04/12 0645  AST 8 10  ALT 7 7  ALKPHOS 60 67  BILITOT 0.5 0.6  PROT 6.3 6.8  ALBUMIN 2.9* 3.2*   No results found for this basename: LIPASE:5,AMYLASE:5 in the last 168 hours No results found for this basename: AMMONIA:5 in the last 168  hours CBC:  Lab 08/05/12 0645  WBC 6.3  NEUTROABS --  HGB 12.9  HCT 38.2  MCV 80.8  PLT 326   Cardiac Enzymes: No results found for this basename: CKTOTAL:5,CKMB:5,CKMBINDEX:5,TROPONINI:5 in the last 168 hours BNP: BNP (last 3 results) No results found for this basename: PROBNP:3 in the last 8760 hours CBG:  Lab 08/10/12 0810 08/10/12 0419 08/09/12 2353 08/09/12 1948 08/09/12 1715  GLUCAP 110* 86 133* 141* 87   Urine pregnancy test: Negative Hemoglobin A1c: 9.5. TSH: 2.268 UDS positive for opiates    Signed:  Loveah Like  [facility] 08/10/2012, 11:21 AM

## 2013-03-26 ENCOUNTER — Emergency Department (HOSPITAL_COMMUNITY)
Admission: EM | Admit: 2013-03-26 | Discharge: 2013-03-27 | Disposition: A | Discharge status: Home / Self Care | Payer: Medicaid Other | Attending: Emergency Medicine | Admitting: Emergency Medicine

## 2013-03-26 ENCOUNTER — Encounter (HOSPITAL_COMMUNITY): Payer: Self-pay | Admitting: Emergency Medicine

## 2013-03-26 ENCOUNTER — Emergency Department (HOSPITAL_COMMUNITY): Payer: Medicaid Other

## 2013-03-26 DIAGNOSIS — E785 Hyperlipidemia, unspecified: Secondary | ICD-10-CM | POA: Insufficient documentation

## 2013-03-26 DIAGNOSIS — Z8719 Personal history of other diseases of the digestive system: Secondary | ICD-10-CM | POA: Insufficient documentation

## 2013-03-26 DIAGNOSIS — N61 Mastitis without abscess: Secondary | ICD-10-CM | POA: Insufficient documentation

## 2013-03-26 DIAGNOSIS — Z79899 Other long term (current) drug therapy: Secondary | ICD-10-CM | POA: Insufficient documentation

## 2013-03-26 DIAGNOSIS — J45909 Unspecified asthma, uncomplicated: Secondary | ICD-10-CM | POA: Insufficient documentation

## 2013-03-26 DIAGNOSIS — N611 Abscess of the breast and nipple: Secondary | ICD-10-CM

## 2013-03-26 DIAGNOSIS — E119 Type 2 diabetes mellitus without complications: Secondary | ICD-10-CM | POA: Insufficient documentation

## 2013-03-26 DIAGNOSIS — Z794 Long term (current) use of insulin: Secondary | ICD-10-CM | POA: Insufficient documentation

## 2013-03-26 DIAGNOSIS — Z8669 Personal history of other diseases of the nervous system and sense organs: Secondary | ICD-10-CM | POA: Insufficient documentation

## 2013-03-26 DIAGNOSIS — Z8659 Personal history of other mental and behavioral disorders: Secondary | ICD-10-CM | POA: Insufficient documentation

## 2013-03-26 DIAGNOSIS — Z87448 Personal history of other diseases of urinary system: Secondary | ICD-10-CM | POA: Insufficient documentation

## 2013-03-26 MED ORDER — HYDROMORPHONE HCL PF 1 MG/ML IJ SOLN
1.0000 mg | Freq: Once | INTRAMUSCULAR | Status: AC
  Administered 2013-03-26: 1 mg via INTRAMUSCULAR
  Filled 2013-03-26: qty 1

## 2013-03-26 MED ORDER — HYDROMORPHONE HCL PF 1 MG/ML IJ SOLN
1.0000 mg | Freq: Once | INTRAMUSCULAR | Status: AC
  Administered 2013-03-27: 1 mg via INTRAMUSCULAR
  Filled 2013-03-26: qty 1

## 2013-03-26 MED ORDER — CLINDAMYCIN HCL 150 MG PO CAPS
300.0000 mg | ORAL_CAPSULE | Freq: Once | ORAL | Status: AC
  Administered 2013-03-26: 300 mg via ORAL
  Filled 2013-03-26: qty 2

## 2013-03-26 MED ORDER — OXYCODONE-ACETAMINOPHEN 5-325 MG PO TABS: 2.0000 | ORAL_TABLET | ORAL | Status: DC | PRN

## 2013-03-26 MED ORDER — CLINDAMYCIN HCL 150 MG PO CAPS: 300.0000 mg | ORAL_CAPSULE | Freq: Three times a day (TID) | ORAL | Status: DC

## 2013-03-26 NOTE — ED Provider Notes (Signed)
CSN: [id number]     Arrival date & time 03/26/13  1844 History   First MD Initiated Contact with Patient 03/26/13 2133     Chief Complaint  Patient presents with  . Cyst   (Consider location/radiation/quality/duration/timing/severity/associated sxs/prior Treatment) HPI History provided by pt.   Pt has had an abscess of medial left breast x 1 week.  Severely painful and draining purulent fluid.  No associated fever, drainage/bleeding from nipple or other skin changes.  No relief w/ warm compresses and percocet.  Had same on R breast last year.  Insulin-dependent diabetic.   Past Medical History  Diagnosis Date  . Interstitial cystitis   . Asthma   . HLD (hyperlipidemia)   . Gastritis     mild per EGD (04/2012)  . Type II diabetes mellitus 2006    Requiring insulin  . History of blood transfusion 2003    "I was anemic" (07/23/2012)  . GERD (gastroesophageal reflux disease)   . Migraines   . Daily headache   . Anxiety   . Exertional dyspnea    Past Surgical History  Procedure Laterality Date  . Esophagogastroduodenoscopy  05/09/2012    Procedure: ESOPHAGOGASTRODUODENOSCOPY (EGD);  Surgeon: Charna Elizabeth, MD;  Location: [facility] ENDOSCOPY;  Service: Endoscopy;  Laterality: N/A;  . Bladder surgery  2007    "opened me up; related to interstitial cystitis" (06/05/2012)  . Abdominal hysterectomy  2004    "Adenomyosis" (07/23/2012)   Family History  Problem Relation Age of Onset  . Sarcoidosis Mother   . Hypertension Mother   . Diabetes Mother   . Cerebral aneurysm Maternal Grandmother    History  Substance Use Topics  . Smoking status: Never Smoker   . Smokeless tobacco: Never Used  . Alcohol Use: No     Comment: rarely   OB History   Grav Para Term Preterm Abortions TAB SAB Ect Mult Living                 Review of Systems  All other systems reviewed and are negative.    Allergies  Kiwi extract  Home Medications   Current Outpatient Rx  Name  Route  Sig  Dispense   Refill  . albuterol (PROVENTIL HFA;VENTOLIN HFA) 108 (90 BASE) MCG/ACT inhaler   Inhalation   Inhale 2 puffs into the lungs every 6 (six) hours as needed. Wheezing or shortness of breath         . insulin detemir (LEVEMIR) 100 UNIT/ML injection   Subcutaneous   Inject 25 Units into the skin 2 (two) times daily.         . insulin lispro (HUMALOG) 100 UNIT/ML injection   Subcutaneous   Inject 2-10 Units into the skin 4 (four) times daily -  with meals and at bedtime. Inject 2-10 Units into the skin 4 times daily before meals and nightly. Inject 2-10 Units into the skin 4 times daily before meals and nightly. BG = 70-199  None BG = 200-250 2 units BG = 251-300 4 units BG = 301-350 6 units BG = 351-400 8 units BG > 400  10 units and Call Provider    Hypoglycemia Guidelines: If BG < 70 Give 4oz juice and recheck BG in .         [name] Kitchen levothyroxine (SYNTHROID, LEVOTHROID) 200 MCG tablet   Oral   Take 200 mcg by mouth Daily. Take 1 tablet (200 mcg total) by mouth daily. On empty stomach         .  ondansetron (ZOFRAN) 8 MG tablet   Oral   Take 1 tablet (8 mg total) by mouth every 8 (eight) hours as needed for nausea.   20 tablet   0   . oxyCODONE-acetaminophen (PERCOCET/ROXICET) 5-325 MG per tablet   Oral   Take 1 tablet by mouth every 4 (four) hours as needed for pain.   15 tablet   0    BP 152/84  Pulse 115  Temp(Src) 98.8 F (37.1 C)  Resp 16  Ht 5\' 4"  (1.626 m)  Wt 230 lb (104.327 kg)  BMI 39.46 kg/m2  SpO2 97%  LMP 02/15/2003 Physical Exam  Nursing note and vitals reviewed. Constitutional: She is oriented to person, place, and time. She appears well-developed and well-nourished. No distress.  HENT:  Head: Normocephalic and atraumatic.  Eyes:  Normal appearance  Neck: Normal range of motion.  Pulmonary/Chest: Effort normal.  Medial 1/3 of L breast indurated and erythematous.  Fluctuance and drainage of purulent fluid at left lower inner quadrant.  Entire breast  as well as sternum are ttp.  No drainage from nipple.   Musculoskeletal: Normal range of motion.  Neurological: She is alert and oriented to person, place, and time.  Psychiatric: She has a normal mood and affect. Her behavior is normal.    ED Course  Procedures (including critical care time)  INCISION AND DRAINAGE Performed by: Ruby Cola E Consent: Verbal consent obtained. Risks and benefits: risks, benefits and alternatives were discussed Type: abscess  Body area: L breast  Anesthesia: local infiltration  Incision was made with a scalpel.  Local anesthetic: lidocaine 2% w/ epinephrine  Anesthetic total: 8 ml  Complexity: complex Blunt dissection to break up loculations  Drainage: purulent  Drainage amount: copious   Packing material: 1/4 in iodoform gauze  Patient tolerance: Patient tolerated the procedure well with no immediate complications.    Labs Review  Labs Reviewed - No data to display Imaging Review No results found.  MDM   1. Abscess of left breast    Poorly controlled insulin-dependent diabetic presents w/ abscess and cellulitis of L breast.  CXR performed to look for signs of underlying osteomyelitis d/t severe tenderness over sternum and is unremarkable.  Abscess I&D'd and pt received first dose of clindamycin.  Prescribed percocet for pain.  I advised return in 2 days for recheck and [facility].  Recommended f/u with PCP for diabetes as well. Cbg 256 today and pt reports that it normally ranges between 300 & 400..  11:56 PM     Otilio Miu, PA-C 03/26/13 2356  Arie Sabina Lauramae Kneisley, PA-C 03/26/13 [phone]

## 2013-03-26 NOTE — ED Notes (Signed)
Patient with abscess on left breast with drainage.  Patient is nauseated at this time.

## 2013-03-27 NOTE — ED Provider Notes (Signed)
Medical screening examination/treatment/procedure(s) were performed by non-physician practitioner and as supervising physician I was immediately available for consultation/collaboration.   Dagmar Hait, MD 03/27/13 [phone]

## 2013-03-28 ENCOUNTER — Encounter (HOSPITAL_COMMUNITY): Payer: Self-pay | Admitting: Emergency Medicine

## 2013-03-28 ENCOUNTER — Inpatient Hospital Stay (HOSPITAL_COMMUNITY)
Admission: EM | Admit: 2013-03-28 | Discharge: 2013-04-01 | DRG: 601 | Disposition: A | Discharge status: Home / Self Care | Payer: Medicaid Other | Attending: Internal Medicine | Admitting: Internal Medicine

## 2013-03-28 DIAGNOSIS — N61 Mastitis without abscess: Principal | ICD-10-CM | POA: Diagnosis present

## 2013-03-28 DIAGNOSIS — R0789 Other chest pain: Secondary | ICD-10-CM

## 2013-03-28 DIAGNOSIS — E039 Hypothyroidism, unspecified: Secondary | ICD-10-CM

## 2013-03-28 DIAGNOSIS — R739 Hyperglycemia, unspecified: Secondary | ICD-10-CM

## 2013-03-28 DIAGNOSIS — IMO0002 Reserved for concepts with insufficient information to code with codable children: Secondary | ICD-10-CM

## 2013-03-28 DIAGNOSIS — E1149 Type 2 diabetes mellitus with other diabetic neurological complication: Secondary | ICD-10-CM | POA: Diagnosis present

## 2013-03-28 DIAGNOSIS — R072 Precordial pain: Secondary | ICD-10-CM

## 2013-03-28 DIAGNOSIS — Z794 Long term (current) use of insulin: Secondary | ICD-10-CM

## 2013-03-28 DIAGNOSIS — E1143 Type 2 diabetes mellitus with diabetic autonomic (poly)neuropathy: Secondary | ICD-10-CM

## 2013-03-28 DIAGNOSIS — R1013 Epigastric pain: Secondary | ICD-10-CM

## 2013-03-28 DIAGNOSIS — F411 Generalized anxiety disorder: Secondary | ICD-10-CM | POA: Diagnosis present

## 2013-03-28 DIAGNOSIS — E785 Hyperlipidemia, unspecified: Secondary | ICD-10-CM

## 2013-03-28 DIAGNOSIS — K3184 Gastroparesis: Secondary | ICD-10-CM | POA: Diagnosis present

## 2013-03-28 DIAGNOSIS — E1165 Type 2 diabetes mellitus with hyperglycemia: Secondary | ICD-10-CM

## 2013-03-28 DIAGNOSIS — K219 Gastro-esophageal reflux disease without esophagitis: Secondary | ICD-10-CM | POA: Diagnosis present

## 2013-03-28 DIAGNOSIS — R112 Nausea with vomiting, unspecified: Secondary | ICD-10-CM

## 2013-03-28 DIAGNOSIS — E876 Hypokalemia: Secondary | ICD-10-CM

## 2013-03-28 DIAGNOSIS — K859 Acute pancreatitis without necrosis or infection, unspecified: Secondary | ICD-10-CM

## 2013-03-28 DIAGNOSIS — K3189 Other diseases of stomach and duodenum: Secondary | ICD-10-CM

## 2013-03-28 DIAGNOSIS — N611 Abscess of the breast and nipple: Secondary | ICD-10-CM

## 2013-03-28 DIAGNOSIS — N3010 Interstitial cystitis (chronic) without hematuria: Secondary | ICD-10-CM

## 2013-03-28 DIAGNOSIS — J45909 Unspecified asthma, uncomplicated: Secondary | ICD-10-CM | POA: Diagnosis present

## 2013-03-28 NOTE — ED Notes (Signed)
Patient here for wound check and packing change.  Patient had I&D of left breast on Friday night.

## 2013-03-29 ENCOUNTER — Inpatient Hospital Stay (HOSPITAL_COMMUNITY): Payer: Medicaid Other

## 2013-03-29 ENCOUNTER — Encounter (HOSPITAL_COMMUNITY): Payer: Self-pay | Admitting: Internal Medicine

## 2013-03-29 DIAGNOSIS — N61 Mastitis without abscess: Principal | ICD-10-CM

## 2013-03-29 DIAGNOSIS — R0789 Other chest pain: Secondary | ICD-10-CM | POA: Diagnosis present

## 2013-03-29 DIAGNOSIS — N611 Abscess of the breast and nipple: Secondary | ICD-10-CM | POA: Diagnosis present

## 2013-03-29 DIAGNOSIS — E039 Hypothyroidism, unspecified: Secondary | ICD-10-CM | POA: Diagnosis present

## 2013-03-29 DIAGNOSIS — R079 Chest pain, unspecified: Secondary | ICD-10-CM

## 2013-03-29 LAB — BASIC METABOLIC PANEL
BUN: 10 mg/dL (ref 6–23)
CO2: 25 mEq/L (ref 19–32)
Calcium: 9.1 mg/dL (ref 8.4–10.5)
Creatinine, Ser: 0.37 mg/dL — ABNORMAL LOW (ref 0.50–1.10)
GFR calc Af Amer: >90 mL/min (ref >90)
GFR calc non Af Amer: >90 mL/min (ref >90)
GFR calc non Af Amer: >90 mL/min (ref >90)
Glucose, Bld: 332 mg/dL — ABNORMAL HIGH (ref 70–99)
Potassium: 3.7 mEq/L (ref 3.5–5.1)
Sodium: 135 mEq/L (ref 135–145)

## 2013-03-29 LAB — CBC WITH DIFFERENTIAL/PLATELET
Basophils Relative: 1 % (ref 0–1)
Eosinophils Absolute: 0.2 10*3/uL (ref 0.0–0.7)
HCT: 38.4 % (ref 36.0–46.0)
Hemoglobin: 13.2 g/dL (ref 12.0–15.0)
MCH: 27.7 pg (ref 26.0–34.0)
MCHC: 34.4 g/dL (ref 30.0–36.0)
Monocytes Absolute: 0.5 10*3/uL (ref 0.1–1.0)
Monocytes Relative: 7 % (ref 3–12)
RDW: 12 % (ref 11.5–15.5)

## 2013-03-29 LAB — CBC
Hemoglobin: 12.2 g/dL (ref 12.0–15.0)
MCH: 27.8 pg (ref 26.0–34.0)
MCV: 80.4 fL (ref 78.0–100.0)
RBC: 4.39 MIL/uL (ref 3.87–5.11)
WBC: 6.6 10*3/uL (ref 4.0–10.5)

## 2013-03-29 LAB — HEMOGLOBIN A1C: Hgb A1c MFr Bld: 13 % — ABNORMAL HIGH (ref <5.7)

## 2013-03-29 LAB — GLUCOSE, CAPILLARY: Glucose-Capillary: 303 mg/dL — ABNORMAL HIGH (ref 70–99)

## 2013-03-29 LAB — TSH: TSH: 16.784 u[IU]/mL — ABNORMAL HIGH (ref 0.350–4.500)

## 2013-03-29 LAB — MRSA PCR SCREENING: MRSA by PCR: NEGATIVE

## 2013-03-29 MED ORDER — VANCOMYCIN HCL 10 G IV SOLR
2000.0000 mg | Freq: Once | INTRAVENOUS | Status: AC
  Administered 2013-03-29: 2000 mg via INTRAVENOUS
  Filled 2013-03-29 (×2): qty 2000

## 2013-03-29 MED ORDER — INSULIN ASPART 100 UNIT/ML ~~LOC~~ SOLN
0.0000 [IU] | Freq: Every day | SUBCUTANEOUS | Status: DC
  Administered 2013-03-29 – 2013-03-30 (×2): 2 [IU] via SUBCUTANEOUS

## 2013-03-29 MED ORDER — INSULIN DETEMIR 100 UNIT/ML ~~LOC~~ SOLN
25.0000 [IU] | Freq: Two times a day (BID) | SUBCUTANEOUS | Status: DC
  Administered 2013-03-29: 25 [IU] via SUBCUTANEOUS
  Filled 2013-03-29 (×3): qty 0.25

## 2013-03-29 MED ORDER — LEVOTHYROXINE SODIUM 200 MCG PO TABS
200.0000 ug | ORAL_TABLET | Freq: Every day | ORAL | Status: DC
  Administered 2013-03-29 – 2013-04-01 (×4): 200 ug via ORAL
  Filled 2013-03-29 (×6): qty 1

## 2013-03-29 MED ORDER — INSULIN ASPART 100 UNIT/ML ~~LOC~~ SOLN: 0.0000 [IU] | Freq: Three times a day (TID) | SUBCUTANEOUS | Status: DC

## 2013-03-29 MED ORDER — ACETAMINOPHEN 325 MG PO TABS
650.0000 mg | ORAL_TABLET | Freq: Four times a day (QID) | ORAL | Status: DC | PRN
  Administered 2013-03-30: 650 mg via ORAL
  Filled 2013-03-29: qty 2

## 2013-03-29 MED ORDER — ONDANSETRON HCL 4 MG/2ML IJ SOLN
4.0000 mg | Freq: Four times a day (QID) | INTRAMUSCULAR | Status: DC | PRN
  Administered 2013-03-30 (×3): 4 mg via INTRAVENOUS
  Filled 2013-03-29 (×2): qty 2

## 2013-03-29 MED ORDER — SODIUM CHLORIDE 0.9 % IV SOLN
INTRAVENOUS | Status: AC
  Administered 2013-03-29 – 2013-03-30 (×2): via INTRAVENOUS

## 2013-03-29 MED ORDER — PIPERACILLIN-TAZOBACTAM 3.375 G IVPB
3.3750 g | Freq: Three times a day (TID) | INTRAVENOUS | Status: DC
  Administered 2013-03-29 – 2013-04-01 (×11): 3.375 g via INTRAVENOUS
  Filled 2013-03-29 (×11): qty 50

## 2013-03-29 MED ORDER — TECHNETIUM TC 99M MEDRONATE IV KIT
25.0000 | PACK | Freq: Once | INTRAVENOUS | Status: AC | PRN
  Administered 2013-03-29: 25 via INTRAVENOUS

## 2013-03-29 MED ORDER — OXYCODONE-ACETAMINOPHEN 5-325 MG PO TABS
2.0000 | ORAL_TABLET | Freq: Once | ORAL | Status: AC
  Administered 2013-03-29: 2 via ORAL
  Filled 2013-03-29: qty 2

## 2013-03-29 MED ORDER — INSULIN ASPART 100 UNIT/ML ~~LOC~~ SOLN
4.0000 [IU] | Freq: Three times a day (TID) | SUBCUTANEOUS | Status: DC
  Administered 2013-03-29 (×2): 4 [IU] via SUBCUTANEOUS
  Administered 2013-03-30: 13:00:00 via SUBCUTANEOUS

## 2013-03-29 MED ORDER — INSULIN DETEMIR 100 UNIT/ML ~~LOC~~ SOLN
28.0000 [IU] | Freq: Two times a day (BID) | SUBCUTANEOUS | Status: DC
  Administered 2013-03-29: 28 [IU] via SUBCUTANEOUS
  Filled 2013-03-29 (×3): qty 0.28

## 2013-03-29 MED ORDER — PROMETHAZINE HCL 25 MG PO TABS
25.0000 mg | ORAL_TABLET | Freq: Four times a day (QID) | ORAL | Status: DC | PRN
  Administered 2013-03-29 – 2013-03-30 (×2): 25 mg via ORAL
  Filled 2013-03-29 (×2): qty 1

## 2013-03-29 MED ORDER — OXYCODONE-ACETAMINOPHEN 5-325 MG PO TABS
2.0000 | ORAL_TABLET | Freq: Four times a day (QID) | ORAL | Status: DC | PRN
  Administered 2013-03-29 – 2013-04-01 (×8): 2 via ORAL
  Filled 2013-03-29 (×10): qty 2

## 2013-03-29 MED ORDER — PIPERACILLIN-TAZOBACTAM 3.375 G IVPB 30 MIN: 3.3750 g | Freq: Once | INTRAVENOUS | Status: AC

## 2013-03-29 MED ORDER — TRAMADOL HCL 50 MG PO TABS
50.0000 mg | ORAL_TABLET | Freq: Four times a day (QID) | ORAL | Status: DC | PRN
  Administered 2013-03-29 – 2013-03-31 (×3): 50 mg via ORAL
  Filled 2013-03-29 (×3): qty 1

## 2013-03-29 MED ORDER — INSULIN ASPART 100 UNIT/ML ~~LOC~~ SOLN
0.0000 [IU] | Freq: Three times a day (TID) | SUBCUTANEOUS | Status: DC
  Administered 2013-03-29: 8 [IU] via SUBCUTANEOUS
  Administered 2013-03-30: 5 [IU] via SUBCUTANEOUS
  Administered 2013-03-30: 3 [IU] via SUBCUTANEOUS
  Administered 2013-03-30: 8 [IU] via SUBCUTANEOUS
  Administered 2013-03-31 (×2): 5 [IU] via SUBCUTANEOUS
  Administered 2013-03-31: 3 [IU] via SUBCUTANEOUS
  Administered 2013-04-01: 5 [IU] via SUBCUTANEOUS

## 2013-03-29 MED ORDER — ALBUTEROL SULFATE HFA 108 (90 BASE) MCG/ACT IN AERS
2.0000 | INHALATION_SPRAY | Freq: Four times a day (QID) | RESPIRATORY_TRACT | Status: DC | PRN
  Filled 2013-03-29: qty 6.7

## 2013-03-29 MED ORDER — VANCOMYCIN HCL IN DEXTROSE 1-5 GM/200ML-% IV SOLN
1000.0000 mg | Freq: Two times a day (BID) | INTRAVENOUS | Status: DC
  Administered 2013-03-29 – 2013-03-31 (×5): 1000 mg via INTRAVENOUS
  Filled 2013-03-29 (×6): qty 200

## 2013-03-29 MED ORDER — ONDANSETRON HCL 4 MG PO TABS: 4.0000 mg | ORAL_TABLET | Freq: Four times a day (QID) | ORAL | Status: DC | PRN

## 2013-03-29 MED ORDER — ACETAMINOPHEN 650 MG RE SUPP: 650.0000 mg | Freq: Four times a day (QID) | RECTAL | Status: DC | PRN

## 2013-03-29 MED ORDER — INSULIN ASPART 100 UNIT/ML ~~LOC~~ SOLN
0.0000 [IU] | Freq: Three times a day (TID) | SUBCUTANEOUS | Status: DC
  Administered 2013-03-29: 5 [IU] via SUBCUTANEOUS
  Administered 2013-03-29: 7 [IU] via SUBCUTANEOUS

## 2013-03-29 NOTE — H&P (Signed)
[facility] History and Physical  Marissa Barrett ZOX:[id number] DOB: 01/28/1976 DOA: 03/28/2013  Referring physician: ER physician. PCP: Ahmed Prima, MD   Chief Complaint: Sternal pain.  HPI: Marissa Barrett is a 37 y.o. female who was recently in the hospital ER 2 days ago for left breast abscess which was drained and was discharged home with clindamycin and was advised to follow. On follow up patient was found to have sternal tenderness. Patient states started a week ago and her breast abscess began 2 weeks ago. On exam patient has significant tenderness in the left breast area medial aspect and also the sternal area. Patient does not look toxic. At this time radiologist has recommended three-phase bone scan to rule out osteomyelitis of sternum since patient cannot have MRI as patient has metallic wires placed for her interstitial cystitis. Patient denies any nausea vomiting abdominal pain difficulty breathing.   Review of Systems: As presented in the history of presenting illness, rest negative.  Past Medical History  Diagnosis Date  . Interstitial cystitis   . Asthma   . HLD (hyperlipidemia)   . Gastritis     mild per EGD (04/2012)  . Type II diabetes mellitus 2006    Requiring insulin  . History of blood transfusion 2003    "I was anemic" (07/23/2012)  . GERD (gastroesophageal reflux disease)   . Migraines   . Daily headache   . Anxiety   . Exertional dyspnea    Past Surgical History  Procedure Laterality Date  . Esophagogastroduodenoscopy  05/09/2012    Procedure: ESOPHAGOGASTRODUODENOSCOPY (EGD);  Surgeon: Charna Elizabeth, MD;  Location: [facility] ENDOSCOPY;  Service: Endoscopy;  Laterality: N/A;  . Bladder surgery  2007    "opened me up; related to interstitial cystitis" (06/05/2012)  . Abdominal hysterectomy  2004    "Adenomyosis" (07/23/2012)   Social History:  reports that she has never smoked. She has never used smokeless tobacco. She reports that she does not drink alcohol or use  illicit drugs.  home. where does patient live--  can do ADLs.  Can patient participate in ADLs?  Allergies  Allergen Reactions  . Kiwi Extract Anaphylaxis, Itching and Swelling    And tongue swelling  . Estradiol Swelling  . Metformin And Related Nausea And Vomiting and Other (See Comments)    Severe diarrhea    Family History  Problem Relation Age of Onset  . Sarcoidosis Mother   . Hypertension Mother   . Diabetes Mother   . Cerebral aneurysm Maternal Grandmother       Prior to Admission medications   Medication Sig Start Date End Date Taking? Authorizing Provider  acetaminophen (TYLENOL) 500 MG tablet Take 500 mg by mouth every 6 (six) hours as needed for pain.    Yes Historical Provider, MD  albuterol (PROVENTIL HFA;VENTOLIN HFA) 108 (90 BASE) MCG/ACT inhaler Inhale 2 puffs into the lungs every 6 (six) hours as needed for wheezing or shortness of breath.    Yes Historical Provider, MD  clindamycin (CLEOCIN) 150 MG capsule Take 300 mg by mouth 3 (three) times daily. 7 day course started 03/27/13   Yes Historical Provider, MD  insulin detemir (LEVEMIR) 100 UNIT/ML injection Inject 25 Units into the skin 2 (two) times daily.   Yes Historical Provider, MD  insulin lispro (HUMALOG) 100 UNIT/ML injection Inject 2-10 Units into the skin 4 (four) times daily -  with meals and at bedtime. Inject 2-10 Units into the skin 4 times daily before meals and nightly. Inject 2-10 Units  into the skin 4 times daily before meals and nightly. BG = 70-199  None BG = 200-250 2 units BG = 251-300 4 units BG = 301-350 6 units BG = 351-400 8 units BG > 400  10 units and Call Provider    Hypoglycemia Guidelines: If BG < 70 Give 4oz juice and recheck BG in . 06/26/12  Yes Historical Provider, MD  levothyroxine (SYNTHROID, LEVOTHROID) 200 MCG tablet Take 200 mcg by mouth daily before breakfast. Take on an empty stomach 04/30/12  Yes Historical Provider, MD  ondansetron (ZOFRAN) 8 MG tablet Take 8 mg by mouth  every 8 (eight) hours as needed for nausea.    Yes Historical Provider, MD  oxyCODONE-acetaminophen (PERCOCET/ROXICET) 5-325 MG per tablet Take 2 tablets by mouth every 4 (four) hours as needed for pain. 03/26/13  Yes Catherine E Schinlever, PA-C  promethazine (PHENERGAN) 25 MG tablet Take 25 mg by mouth every 6 (six) hours as needed for nausea.  10/12/12  Yes Historical Provider, MD  traMADol (ULTRAM) 50 MG tablet Take 50 mg by mouth every 6 (six) hours as needed for pain.  10/12/12  Yes Historical Provider, MD   Physical Exam: Filed Vitals:   03/28/13 2112  BP: 131/86  Pulse: 106  Temp: 98.4 F (36.9 C)  Resp: 18  SpO2: 98%     General:   well-developed well-nourished.  Eyes:  anicteric no pallor.   ENT:  no discharge from ears eyes nose mouth.  Neck:  no mass felt.   Cardiovascular:  S1-S2 heard.  Respiratory: No rhonchi or crepitations.   Abdomen:  soft nontender bowel sounds present.  Skin:  left breast has indurated skin on the medial aspect with skin changes of recent I&D. Sternum is tender.   Musculoskeletal:  no edema.   Psychiatric: appears normal.   Neurologic:  alert awake oriented to time place and person. Moves all extremities.   Labs on Admission:  Basic Metabolic Panel:  Recent Labs Lab 03/29/13 0002  NA 135  K 3.6  CL 100  CO2 23  GLUCOSE 279*  BUN 10  CREATININE 0.37*  CALCIUM 9.1   Liver Function Tests: No results found for this basename: AST, ALT, ALKPHOS, BILITOT, PROT, ALBUMIN,  in the last 168 hours No results found for this basename: LIPASE, AMYLASE,  in the last 168 hours No results found for this basename: AMMONIA,  in the last 168 hours CBC:  Recent Labs Lab 03/29/13 0002  WBC 6.5  NEUTROABS 3.7  HGB 13.2  HCT 38.4  MCV 80.5  PLT 337   Cardiac Enzymes: No results found for this basename: CKTOTAL, CKMB, CKMBINDEX, TROPONINI,  in the last 168 hours  BNP (last 3 results) No results found for this basename: PROBNP,  in the  last 8760 hours CBG:  Recent Labs Lab 03/26/13 2144  GLUCAP 257*    Radiological Exams on Admission: No results found.   Assessment/Plan Principal Problem:   Breast abscess Active Problems:   Interstitial cystitis   Diabetic gastroparesis associated with type 2 diabetes mellitus   Sternal pain   Hypothyroidism   1. Breast abscess with sternal tenderness concerning for osteomyelitis of the sternum  - at this time 3 phase bone scan has been ordered to check for osteomyelitis of the sternum. Patient cannot have MRI due to the metallic wire is placed for interstitial cystitis. For now we will keep patient on vancomycin and Zosyn.  2. Diabetes mellitus type 2 uncontrolled  - check hemoglobin  A1c and closely follow CBGs. Continue present medications and based on CBG checks and hemoglobin A1c further recommendations.  3. Hypothyroidism  - continue Synthroid check TSH.  4. History of interstitial cystitis    Code Status:  Full code.  Family Communication: None.  Disposition Plan: admit to inpatient.    KAKRAKANDY,ARSHAD N. [facility] Pager [phone].  If 7PM-7AM, please contact night-coverage [email or web address] Password [facility] 03/29/2013, 2:53 AM

## 2013-03-29 NOTE — Progress Notes (Signed)
Inpatient Diabetes Program Recommendations  AACE/ADA: New Consensus Statement on Inpatient Glycemic Control (2013)  Target Ranges:  Prepandial:   less than 140 mg/dL      Peak postprandial:   less than 180 mg/dL (1-2 hours)      Critically ill patients:  140 - 180 mg/dL     Results for Marissa Barrett, Marissa Barrett (MRN [id number]) as of 03/29/2013 12:59  Ref. Range 03/29/2013 08:27 03/29/2013 11:58  Glucose-Capillary Latest Range: 70-99 mg/dL 578 (H) 469 (H)    **Admitted with breast abscess.    **Noted home dose of Levemir started today.  Also noted Novolog 4 units tid with meals (meal coverage) started at noon today.  **Noted A1c in process.  Will follow closely while inpatient. Ambrose Finland RN, MSN, CDE Diabetes Coordinator Inpatient Diabetes Program Team Pager: [phone] (8a-10p)

## 2013-03-29 NOTE — ED Provider Notes (Signed)
CSN: [id number]     Arrival date & time 03/28/13  2107 History   First MD Initiated Contact with Patient 03/28/13 2226     Chief Complaint  Patient presents with  . wound abscess-admission    (Consider location/radiation/quality/duration/timing/severity/associated sxs/prior Treatment) HPI History provided by pt and prior chart.  Per prior chart, pt presented to ED two days ago w/ abscess and cellulitis L medial breast.  Abscess and I&D'd, pt prescribed clindamycin and percocet and was instructed to return in 2 days for recheck.  Returns today w/ persistent drainage but improved erythema and pain.  There is however, increased pain at the sternum.  Aggravated by palpation.  No associated fever.  Has been compliant w/ abx.  Past Medical History  Diagnosis Date  . Interstitial cystitis   . Asthma   . HLD (hyperlipidemia)   . Gastritis     mild per EGD (04/2012)  . Type II diabetes mellitus 2006    Requiring insulin  . History of blood transfusion 2003    "I was anemic" (07/23/2012)  . GERD (gastroesophageal reflux disease)   . Migraines   . Daily headache   . Anxiety   . Exertional dyspnea    Past Surgical History  Procedure Laterality Date  . Esophagogastroduodenoscopy  05/09/2012    Procedure: ESOPHAGOGASTRODUODENOSCOPY (EGD);  Surgeon: Marissa Elizabeth, MD;  Location: [facility] ENDOSCOPY;  Service: Endoscopy;  Laterality: N/A;  . Bladder surgery  2007    "opened me up; related to interstitial cystitis" (06/05/2012)  . Abdominal hysterectomy  2004    "Adenomyosis" (07/23/2012)   Family History  Problem Relation Age of Onset  . Sarcoidosis Mother   . Hypertension Mother   . Diabetes Mother   . Cerebral aneurysm Maternal Grandmother    History  Substance Use Topics  . Smoking status: Never Smoker   . Smokeless tobacco: Never Used  . Alcohol Use: No     Comment: rarely   OB History   Grav Para Term Preterm Abortions TAB SAB Ect Mult Living                 Review of Systems  All  other systems reviewed and are negative.    Allergies  Kiwi extract; Estradiol; and Metformin and related  Home Medications   Current Outpatient Rx  Name  Route  Sig  Dispense  Refill  . acetaminophen (TYLENOL) 500 MG tablet   Oral   Take 500 mg by mouth every 6 (six) hours as needed for pain.          [name] Kitchen albuterol (PROVENTIL HFA;VENTOLIN HFA) 108 (90 BASE) MCG/ACT inhaler   Inhalation   Inhale 2 puffs into the lungs every 6 (six) hours as needed for wheezing or shortness of breath.          . clindamycin (CLEOCIN) 150 MG capsule   Oral   Take 300 mg by mouth 3 (three) times daily. 7 day course started 03/27/13         . insulin detemir (LEVEMIR) 100 UNIT/ML injection   Subcutaneous   Inject 25 Units into the skin 2 (two) times daily.         . insulin lispro (HUMALOG) 100 UNIT/ML injection   Subcutaneous   Inject 2-10 Units into the skin 4 (four) times daily -  with meals and at bedtime. Inject 2-10 Units into the skin 4 times daily before meals and nightly. Inject 2-10 Units into the skin 4 times daily before meals and  nightly. BG = 70-199  None BG = 200-250 2 units BG = 251-300 4 units BG = 301-350 6 units BG = 351-400 8 units BG > 400  10 units and Call Provider    Hypoglycemia Guidelines: If BG < 70 Give 4oz juice and recheck BG in .         [name] Kitchen levothyroxine (SYNTHROID, LEVOTHROID) 200 MCG tablet   Oral   Take 200 mcg by mouth daily before breakfast. Take on an empty stomach         . ondansetron (ZOFRAN) 8 MG tablet   Oral   Take 8 mg by mouth every 8 (eight) hours as needed for nausea.          [name] Kitchen oxyCODONE-acetaminophen (PERCOCET/ROXICET) 5-325 MG per tablet   Oral   Take 2 tablets by mouth every 4 (four) hours as needed for pain.   20 tablet   0   . promethazine (PHENERGAN) 25 MG tablet   Oral   Take 25 mg by mouth every 6 (six) hours as needed for nausea.          . traMADol (ULTRAM) 50 MG tablet   Oral   Take 50 mg by mouth every 6 (six)  hours as needed for pain.           BP 131/86  Pulse 106  Temp(Src) 98.4 F (36.9 C)  Resp 18  SpO2 98%  LMP 02/15/2003 Physical Exam  Nursing note and vitals reviewed. Constitutional: She is oriented to person, place, and time. She appears well-developed and well-nourished. No distress.  HENT:  Head: Normocephalic and atraumatic.  Eyes:  Normal appearance  Neck: Normal range of motion.  Cardiovascular: Normal rate and regular rhythm.   Pulmonary/Chest: Effort normal and breath sounds normal. No respiratory distress.  Erythema and induration of left breast has receded.  1.5cm incision lower inner quadrant w/ small amt purulent drainage.  No discharge/bleeding from nipple.  Severe tenderness medial half of breast as well as over the sternum. Bedside US neg for subcutaneous fluid collection overlying sternum.  Musculoskeletal: Normal range of motion.  Neurological: She is alert and oriented to person, place, and time.  Skin: Skin is warm and dry. No rash noted.  Psychiatric: She has a normal mood and affect. Her behavior is normal.    ED Course  Procedures (including critical care time) Labs Review Labs Reviewed  BASIC METABOLIC PANEL - Abnormal; Notable for the following:    Glucose, Bld 279 (*)    Creatinine, Ser 0.37 (*)    All other components within normal limits  CBC WITH DIFFERENTIAL  SEDIMENTATION RATE   Imaging Review No results found.  MDM  No diagnosis found. 36yo poorly controlled, insulin-dependent diabetic F presents for 2d recheck of L breast abscess/cellulitis.   Erythema, induration and pain of L breast has improved and wound continues to drain purulent fluid.  Has increased pain over the sternum. No evidence of osteomyelitis on CXR at initial visit.  Case dicussed w/ Dr. Criss Alvine.  He recommends admission for imaging of sternum.  MRI contraindicated d/t metal wires from bladder stimulator in back.  Pt to be admitted to [facility] and will likely have a bone  scan in am.  Sed rate pending.    Otilio Miu, PA-C 03/29/13 [phone]

## 2013-03-29 NOTE — Progress Notes (Signed)
Patient ID: Marissa Barrett  female  WUJ:[id number]    DOB: October 02, 1975    DOA: 03/28/2013  PCP: Ahmed Prima, MD  Assessment/Plan: Principal Problem: Left Breast abscess with sternal pain: Patient reports started as a small bump, denied any trauma.   - Patient had I&D of the abscess done on 03/26/13 in the ER, apparently patient was given clindamycin after wound packing was done. I do not see any cultures from that I&D.  - At the time of my examination, it is actively draining thick purulent material, 10x7 cm hard indurated area - Patient just had a bone scan done to rule out any osteomyelitis, results pending. There is no CT or breast ultrasound done.  - I had requested surgery to see the patient, but will likely need I&D in OR, aggressive wound care.  - Continue IV vancomycin and Zosyn, if patient does have sternal osteomyelitis, will require ID consultation and prolonged IV antibiotics - Will strongly recommend mammogram after the abscess has completely resolved     Active Problems:    Diabetic gastroparesis associated with type 2 diabetes mellitus: - Uncontrolled hemoglobin A1c 13.0  - Increased levemir to 28units BID, add meal coverage, moderate SSI    Hypothyroidism: TSH 16.7 - Currently on Synthroid 200 mcg daily  - Outpatient increase dose by PCP or endocrinologist.   DVT Prophylaxis:  Code Status:  Disposition:    Subjective: Still has pain in the left breast and the sternal area. Afebrile   Objective: Weight change:   Intake/Output Summary (Last 24 hours) at 03/29/13 1442 Last data filed at 03/29/13 1439  Gross per 24 hour  Intake  937.5 ml  Output      0 ml  Net  937.5 ml   Blood pressure 130/69, pulse 87, temperature 98 F (36.7 C), resp. rate 20, height 5\' 4"  (1.626 m), weight 104.3 kg (229 lb 15 oz), last menstrual period 02/15/2003, SpO2 100.00%.  Physical Exam: General: Alert and awake, oriented x3, not in any acute distress. HEENT: anicteric sclera, PERLA,  EOMI CVS: S1-S2 clear, no murmur rubs or gallops Chest: clear to auscultation bilaterally, no wheezing, rales or rhonchi Tenderness to palpation on the sternal area, actively draining purulent material from the left breast abscess, 10 x 7 cm hard indurated area, tender to touch  Abdomen: soft nontender, nondistended, normal bowel sounds  Extremities: no cyanosis, clubbing or edema noted bilaterally Neuro: Cranial nerves II-XII intact, no focal neurological deficits  Lab Results: Basic Metabolic Panel:  Recent Labs Lab 03/29/13 0002 03/29/13 0615  NA 135 135  K 3.6 3.7  CL 100 101  CO2 23 25  GLUCOSE 279* 332*  BUN 10 10  CREATININE 0.37* 0.38*  CALCIUM 9.1 8.8   Liver Function Tests: No results found for this basename: AST, ALT, ALKPHOS, BILITOT, PROT, ALBUMIN,  in the last 168 hours No results found for this basename: LIPASE, AMYLASE,  in the last 168 hours No results found for this basename: AMMONIA,  in the last 168 hours CBC:  Recent Labs Lab 03/29/13 0002 03/29/13 0615  WBC 6.5 6.6  NEUTROABS 3.7  --   HGB 13.2 12.2  HCT 38.4 35.3*  MCV 80.5 80.4  PLT 337 312   Cardiac Enzymes: No results found for this basename: CKTOTAL, CKMB, CKMBINDEX, TROPONINI,  in the last 168 hours BNP: No components found with this basename: POCBNP,  CBG:  Recent Labs Lab 03/26/13 2144 03/29/13 0827 03/29/13 1158  GLUCAP 257* 303* 300*  Micro Results: Recent Results (from the past 240 hour(s))  MRSA PCR SCREENING     Status: None   Collection Time    03/29/13  8:38 AM      Result Value Range Status   MRSA by PCR NEGATIVE  NEGATIVE Final   Comment:            The GeneXpert MRSA Assay (FDA     approved for NASAL specimens     only), is one component of a     comprehensive MRSA colonization     surveillance program. It is not     intended to diagnose MRSA     infection nor to guide or     monitor treatment for     MRSA infections.    Studies/Results: Dg Chest 2  View  03/26/2013   CLINICAL DATA:  Shortness of breath. Left breast abscess drained.  EXAM: CHEST  2 VIEW  COMPARISON:  05/17/2012  FINDINGS: The heart size and mediastinal contours are within normal limits. Both lungs are clear. The visualized skeletal structures are unremarkable.  IMPRESSION: No active cardiopulmonary disease.   Electronically Signed   By: Charlett Nose M.D.   On: 03/26/2013 23:45    Medications: Scheduled Meds: . insulin aspart  0-9 Units Subcutaneous TID WC  . insulin aspart  4 Units Subcutaneous TID WC  . insulin detemir  25 Units Subcutaneous BID  . levothyroxine  200 mcg Oral QAC breakfast  . piperacillin-tazobactam (ZOSYN)  IV  3.375 g Intravenous Q8H  . vancomycin  1,000 mg Intravenous Q12H      LOS: 1 day   RAI,RIPUDEEP M.D. [facility] 03/29/2013, 2:42 PM Pager: [phone]  If 7PM-7AM, please contact night-coverage [email or web address] Password [email or web address]

## 2013-03-29 NOTE — Consult Note (Signed)
Marissa Barrett 20-Oct-1975  [id number].    Requesting MD: Dr. Isidoro Donning Chief Complaint/Reason for Consult: Left breast abscess HPI:  37 y/o female with PMH uncontrolled diabetes, HTN, hypothroidism, ?delayed gastric emptying presents to [facility] for the with 3 weeks of developing a left breast lump.  She started to use warm compresses which did not resolve it.  It started as a small bump on the inferior medial aspect of the left breast.  It started draining last Thursday night, which is why she came to [facility] on Friday.  The ED PA I&D'ed it in the ER, and sent her home on clindamycin.  She complains of significant tenderness, erythema, and hardness over left side of sternum/inferior-medial breast.  The pain is constant, and moving makes it works.  +sweats, low grade fever, no chills.  Had a h/o right breast mass in July 2011 which resolved with I&D in the family practice office.  No h/o mammograms or ultrasound done, and no history of breast cancers.  +Nausea, no vomiting, no SOB/chest pain other than locally at the site.  Normal BM's and normal urination, no numbness/tingling, no dizziness or recent falls.  Last meal at lunch today.  DM A1C 9.5 in January.  Bladder stimulator for chronic cystitis placed in [place].  It was removed in the local area stimulator, but they couldn't remove the wires.  ROS: All systems reviewed and otherwise negative except for as above  Family History  Problem Relation Age of Onset  . Sarcoidosis Mother   . Hypertension Mother   . Diabetes Mother   . Cerebral aneurysm Maternal Grandmother     Past Medical History  Diagnosis Date  . Interstitial cystitis   . Asthma   . HLD (hyperlipidemia)   . Gastritis     mild per EGD (04/2012)  . Type II diabetes mellitus 2006    Requiring insulin  . History of blood transfusion 2003    "I was anemic" (07/23/2012)  . GERD (gastroesophageal reflux disease)   . Migraines   . Daily headache   . Anxiety   . Exertional dyspnea      Past Surgical History  Procedure Laterality Date  . Esophagogastroduodenoscopy  05/09/2012    Procedure: ESOPHAGOGASTRODUODENOSCOPY (EGD);  Surgeon: Charna Elizabeth, MD;  Location: [facility] ENDOSCOPY;  Service: Endoscopy;  Laterality: N/A;  . Bladder surgery  2007    "opened me up; related to interstitial cystitis" (06/05/2012)  . Abdominal hysterectomy  2004    "Adenomyosis" (07/23/2012)    Social History:  reports that she has never smoked. She has never used smokeless tobacco. She reports that she does not drink alcohol or use illicit drugs.  Allergies:  Allergies  Allergen Reactions  . Kiwi Extract Anaphylaxis, Itching and Swelling    And tongue swelling  . Estradiol Swelling  . Metformin And Related Nausea And Vomiting and Other (See Comments)    Severe diarrhea    Medications Prior to Admission  Medication Sig Dispense Refill  . acetaminophen (TYLENOL) 500 MG tablet Take 500 mg by mouth every 6 (six) hours as needed for pain.       [name] Kitchen albuterol (PROVENTIL HFA;VENTOLIN HFA) 108 (90 BASE) MCG/ACT inhaler Inhale 2 puffs into the lungs every 6 (six) hours as needed for wheezing or shortness of breath.       . clindamycin (CLEOCIN) 150 MG capsule Take 300 mg by mouth 3 (three) times daily. 7 day course started 03/27/13      . insulin detemir (LEVEMIR) 100 UNIT/ML  injection Inject 25 Units into the skin 2 (two) times daily.      . insulin lispro (HUMALOG) 100 UNIT/ML injection Inject 2-10 Units into the skin 4 (four) times daily -  with meals and at bedtime. Inject 2-10 Units into the skin 4 times daily before meals and nightly. Inject 2-10 Units into the skin 4 times daily before meals and nightly. BG = 70-199  None BG = 200-250 2 units BG = 251-300 4 units BG = 301-350 6 units BG = 351-400 8 units BG > 400  10 units and Call Provider    Hypoglycemia Guidelines: If BG < 70 Give 4oz juice and recheck BG in .      [name] Kitchen levothyroxine (SYNTHROID, LEVOTHROID) 200 MCG tablet Take 200 mcg by mouth  daily before breakfast. Take on an empty stomach      . ondansetron (ZOFRAN) 8 MG tablet Take 8 mg by mouth every 8 (eight) hours as needed for nausea.       [name] Kitchen oxyCODONE-acetaminophen (PERCOCET/ROXICET) 5-325 MG per tablet Take 2 tablets by mouth every 4 (four) hours as needed for pain.  20 tablet  0  . promethazine (PHENERGAN) 25 MG tablet Take 25 mg by mouth every 6 (six) hours as needed for nausea.       . traMADol (ULTRAM) 50 MG tablet Take 50 mg by mouth every 6 (six) hours as needed for pain.         Blood pressure 132/79, pulse 88, temperature 97.9 F (36.6 C), resp. rate 20, height 5\' 4"  (1.626 m), weight 229 lb 15 oz (104.3 kg), last menstrual period 02/15/2003, SpO2 99.00%. Physical Exam: General: pleasant, WD/WN AA female who is laying in bed in NAD HEENT: head is normocephalic, atraumatic.  Sclera are noninjected.  Ears and nose without any masses or lesions.  Mouth is pink and moist Breast:  Right breast normal, 10x8cm induration and fluctuance, central ulceration, inferior to the ulceration 1cm incision superficial to abscess cavity, left inferior sternum pain to palpation Heart: regular, rate, and rhythm.  No obvious murmurs, gallops, or rubs noted.  Palpable pedal pulses bilaterally Lungs: CTAB, no wheezes, rhonchi, or rales noted.  Respiratory effort nonlabored Abd: soft, NT/ND, +BS, no masses, hernias, or organomegaly MS: all 4 extremities are symmetrical with no cyanosis, clubbing, or edema. Skin: warm and dry with no masses, lesions, or rashes Psych: A&Ox3 with an appropriate affect.  Results for orders placed during the hospital encounter of 03/28/13 (from the past 48 hour(s))  CBC WITH DIFFERENTIAL     Status: None   Collection Time    03/29/13 12:02 AM      Result Value Range   WBC 6.5  4.0 - 10.5 K/uL   RBC 4.77  3.87 - 5.11 MIL/uL   Hemoglobin 13.2  12.0 - 15.0 g/dL   HCT 16.1  09.6 - 04.5 %   MCV 80.5  78.0 - 100.0 fL   MCH 27.7  26.0 - 34.0 pg   MCHC 34.4   30.0 - 36.0 g/dL   RDW 40.9  81.1 - 91.4 %   Platelets 337  150 - 400 K/uL   Neutrophils Relative % 56  43 - 77 %   Neutro Abs 3.7  1.7 - 7.7 K/uL   Lymphocytes Relative 33  12 - 46 %   Lymphs Abs 2.1  0.7 - 4.0 K/uL   Monocytes Relative 7  3 - 12 %   Monocytes Absolute 0.5  0.1 - 1.0  K/uL   Eosinophils Relative 3  0 - 5 %   Eosinophils Absolute 0.2  0.0 - 0.7 K/uL   Basophils Relative 1  0 - 1 %   Basophils Absolute 0.0  0.0 - 0.1 K/uL  BASIC METABOLIC PANEL     Status: Abnormal   Collection Time    03/29/13 12:02 AM      Result Value Range   Sodium 135  135 - 145 mEq/L   Potassium 3.6  3.5 - 5.1 mEq/L   Chloride 100  96 - 112 mEq/L   CO2 23  19 - 32 mEq/L   Glucose, Bld 279 (*) 70 - 99 mg/dL   BUN 10  6 - 23 mg/dL   Creatinine, Ser 1.61 (*) 0.50 - 1.10 mg/dL   Calcium 9.1  8.4 - 09.6 mg/dL   GFR calc non Af Amer >90  >90 mL/min   GFR calc Af Amer >90  >90 mL/min   Comment: (NOTE)     The eGFR has been calculated using the CKD EPI equation.     This calculation has not been validated in all clinical situations.     eGFR's persistently <90 mL/min signify possible Chronic Kidney     Disease.  SEDIMENTATION RATE     Status: Abnormal   Collection Time    03/29/13 12:02 AM      Result Value Range   Sed Rate 70 (*) 0 - 22 mm/hr  BASIC METABOLIC PANEL     Status: Abnormal   Collection Time    03/29/13  6:15 AM      Result Value Range   Sodium 135  135 - 145 mEq/L   Potassium 3.7  3.5 - 5.1 mEq/L   Chloride 101  96 - 112 mEq/L   CO2 25  19 - 32 mEq/L   Glucose, Bld 332 (*) 70 - 99 mg/dL   BUN 10  6 - 23 mg/dL   Creatinine, Ser 0.45 (*) 0.50 - 1.10 mg/dL   Calcium 8.8  8.4 - 40.9 mg/dL   GFR calc non Af Amer >90  >90 mL/min   GFR calc Af Amer >90  >90 mL/min   Comment: (NOTE)     The eGFR has been calculated using the CKD EPI equation.     This calculation has not been validated in all clinical situations.     eGFR's persistently <90 mL/min signify possible Chronic  Kidney     Disease.  CBC     Status: Abnormal   Collection Time    03/29/13  6:15 AM      Result Value Range   WBC 6.6  4.0 - 10.5 K/uL   RBC 4.39  3.87 - 5.11 MIL/uL   Hemoglobin 12.2  12.0 - 15.0 g/dL   HCT 81.1 (*) 91.4 - 78.2 %   MCV 80.4  78.0 - 100.0 fL   MCH 27.8  26.0 - 34.0 pg   MCHC 34.6  30.0 - 36.0 g/dL   RDW 95.6  21.3 - 08.6 %   Platelets 312  150 - 400 K/uL  TSH     Status: Abnormal   Collection Time    03/29/13  6:15 AM      Result Value Range   TSH 16.784 (*) 0.350 - 4.500 uIU/mL   Comment: Performed at [facility]  GLUCOSE, CAPILLARY     Status: Abnormal   Collection Time    03/29/13  8:27 AM  Result Value Range   Glucose-Capillary 303 (*) 70 - 99 mg/dL   Comment 1 Notify RN    MRSA PCR SCREENING     Status: None   Collection Time    03/29/13  8:38 AM      Result Value Range   MRSA by PCR NEGATIVE  NEGATIVE   Comment:            The GeneXpert MRSA Assay (FDA     approved for NASAL specimens     only), is one component of a     comprehensive MRSA colonization     surveillance program. It is not     intended to diagnose MRSA     infection nor to guide or     monitor treatment for     MRSA infections.  GLUCOSE, CAPILLARY     Status: Abnormal   Collection Time    03/29/13 11:58 AM      Result Value Range   Glucose-Capillary 300 (*) 70 - 99 mg/dL   Comment 1 Notify RN     No results found.    Assessment/Plan Left breast abscess Sternal pain 1.  Agree with Vanc and Zosyn, no cultures pending thus far, can obtain in the OR 2.  NPO, IVF, pain control, antiemetics 3.  Warm compresses 4.  Ambulate and IS 5.  SCD's and VTE prophylaxis per medicine, but none after midnight please!!! 6.  Plan for OR tomorrow morning, will discuss with Dr. Dwain Sarna to see if ultrasound is indicated 7.  NM 3 phase bone scan pending results  Multiple chronic medical conditions - per medicine Uncontrolled Type 2 DM - management will be crucial in  prevention of further abscesses ?Delayed gastric emptying - study was negative     DORT, Akiva Brassfield 03/29/2013, 1:22 PM Pager: [phone]

## 2013-03-29 NOTE — Consult Note (Signed)
She has left breast abscess.  Needs I/D in or tomorrow and we discussed that.  I don't think she needs a bone scan.  She needs glucose under better control

## 2013-03-29 NOTE — ED Provider Notes (Signed)
Medical screening examination/treatment/procedure(s) were conducted as a shared visit with non-physician practitioner(s) and myself.  I personally evaluated the patient during the encounter   Given patient's persistent sternal tenderness with abscess to left breast (abscess portion improving) and patient being a poorly controlled diabetic I feel she needs further w/u for osteo of sternum. Unable to get MRI. Will admit to hospitalist for IV abx and bone scan.  Audree Camel, MD 03/29/13 1257

## 2013-03-29 NOTE — Progress Notes (Signed)
ANTIBIOTIC CONSULT NOTE - INITIAL  Pharmacy Consult for Vancocin and Zosyn Indication: abscess w/ suspected osteomyelitis  Allergies  Allergen Reactions  . Kiwi Extract Anaphylaxis, Itching and Swelling    And tongue swelling  . Estradiol Swelling  . Metformin And Related Nausea And Vomiting and Other (See Comments)    Severe diarrhea    Patient Measurements: Height: 5\' 4"  (162.6 cm) Weight: 229 lb 15 oz (104.3 kg) IBW/kg (Calculated) : 54.7  Vital Signs: Temp: 98.4 F (36.9 C) (09/14 2112) BP: 131/98 mmHg (09/15 0245) Pulse Rate: 97 (09/15 0245)  Labs:  Recent Labs  03/29/13 0002  WBC 6.5  HGB 13.2  PLT 337  CREATININE 0.37*   Estimated Creatinine Clearance: 112.1 ml/min (by C-G formula based on Cr of 0.37).   Microbiology: No results found for this or any previous visit (from the past 720 hour(s)).  Medical History: Past Medical History  Diagnosis Date  . Interstitial cystitis   . Asthma   . HLD (hyperlipidemia)   . Gastritis     mild per EGD (04/2012)  . Type II diabetes mellitus 2006    Requiring insulin  . History of blood transfusion 2003    "I was anemic" (07/23/2012)  . GERD (gastroesophageal reflux disease)   . Migraines   . Daily headache   . Anxiety   . Exertional dyspnea     Assessment: 37yo female was seen in ED 2d ago for breast abscess which was drained and pt was discharged on clindamycin as outpt, presents now for wound check and packing change, concern for osteomyelitis of sternum given tenderness, to begin IV ABX.  Goal of Therapy:  Vancomycin trough level 15-20 mcg/ml  Plan:  Will give vancomycin 2000mg  IV x1 now then begin vanc 1g IV Q12H as well as Zosyn 3.375g IV Q8H and monitor CBC, Cx, levels prn.  Vernard Gambles, PharmD, BCPS  03/29/2013,3:12 AM

## 2013-03-29 NOTE — Plan of Care (Signed)
Bone scan reviewed IMPRESSION:  No scintigraphic evidence of osteomyelitis of the sternum.    Vennie Waymire M.D. [facility] 03/29/2013, 6:49 PM  Pager: [phone]

## 2013-03-30 ENCOUNTER — Encounter (HOSPITAL_COMMUNITY)
Admission: EM | Disposition: A | Discharge status: Home / Self Care | Payer: Self-pay | Source: Home / Self Care | Attending: Internal Medicine

## 2013-03-30 ENCOUNTER — Inpatient Hospital Stay (HOSPITAL_COMMUNITY): Payer: Medicaid Other | Admitting: Anesthesiology

## 2013-03-30 ENCOUNTER — Encounter (HOSPITAL_COMMUNITY): Payer: Self-pay | Admitting: Certified Registered Nurse Anesthetist

## 2013-03-30 ENCOUNTER — Encounter (HOSPITAL_COMMUNITY): Payer: Self-pay | Admitting: Anesthesiology

## 2013-03-30 DIAGNOSIS — K3184 Gastroparesis: Secondary | ICD-10-CM

## 2013-03-30 DIAGNOSIS — E1149 Type 2 diabetes mellitus with other diabetic neurological complication: Secondary | ICD-10-CM

## 2013-03-30 DIAGNOSIS — E1165 Type 2 diabetes mellitus with hyperglycemia: Secondary | ICD-10-CM

## 2013-03-30 HISTORY — PX: IRRIGATION AND DEBRIDEMENT ABSCESS: SHX5252

## 2013-03-30 LAB — BASIC METABOLIC PANEL
BUN: 8 mg/dL (ref 6–23)
CO2: 23 mEq/L (ref 19–32)
Calcium: 8.7 mg/dL (ref 8.4–10.5)
Creatinine, Ser: 0.38 mg/dL — ABNORMAL LOW (ref 0.50–1.10)
GFR calc non Af Amer: >90 mL/min (ref >90)
Glucose, Bld: 282 mg/dL — ABNORMAL HIGH (ref 70–99)
Sodium: 136 mEq/L (ref 135–145)

## 2013-03-30 LAB — CBC
HCT: 35.4 % — ABNORMAL LOW (ref 36.0–46.0)
Hemoglobin: 12.4 g/dL (ref 12.0–15.0)
MCH: 28 pg (ref 26.0–34.0)
MCHC: 35 g/dL (ref 30.0–36.0)
MCV: 79.9 fL (ref 78.0–100.0)

## 2013-03-30 LAB — GLUCOSE, CAPILLARY
Glucose-Capillary: 280 mg/dL — ABNORMAL HIGH (ref 70–99)
Glucose-Capillary: 220 mg/dL — ABNORMAL HIGH (ref 70–99)
Glucose-Capillary: 209 mg/dL — ABNORMAL HIGH (ref 70–99)

## 2013-03-30 LAB — T3: T3, Total: 88.5 ng/dl (ref 80.0–204.0)

## 2013-03-30 SURGERY — IRRIGATION AND DEBRIDEMENT ABSCESS
Anesthesia: General | Site: Breast | Laterality: Left | Wound class: Dirty or Infected

## 2013-03-30 MED ORDER — MIDAZOLAM HCL 5 MG/5ML IJ SOLN
INTRAMUSCULAR | Status: DC | PRN
  Administered 2013-03-30: 2 mg via INTRAVENOUS

## 2013-03-30 MED ORDER — LACTATED RINGERS IV SOLN: INTRAVENOUS | Status: DC

## 2013-03-30 MED ORDER — LIDOCAINE HCL (CARDIAC) 20 MG/ML IV SOLN
INTRAVENOUS | Status: DC | PRN
  Administered 2013-03-30: 70 mg via INTRAVENOUS

## 2013-03-30 MED ORDER — LACTATED RINGERS IV SOLN
INTRAVENOUS | Status: DC
  Administered 2013-03-30 (×2): via INTRAVENOUS
  Administered 2013-03-31: 20 mL/h via INTRAVENOUS

## 2013-03-30 MED ORDER — FENTANYL CITRATE 0.05 MG/ML IJ SOLN
INTRAMUSCULAR | Status: AC
  Administered 2013-03-30: 10:00:00
  Filled 2013-03-30: qty 2

## 2013-03-30 MED ORDER — FENTANYL CITRATE 0.05 MG/ML IJ SOLN
25.0000 ug | INTRAMUSCULAR | Status: AC | PRN
  Administered 2013-03-30 (×6): 25 ug via INTRAVENOUS

## 2013-03-30 MED ORDER — INSULIN DETEMIR 100 UNIT/ML ~~LOC~~ SOLN
30.0000 [IU] | Freq: Two times a day (BID) | SUBCUTANEOUS | Status: DC
  Administered 2013-03-30 – 2013-03-31 (×2): 30 [IU] via SUBCUTANEOUS
  Filled 2013-03-30 (×3): qty 0.3

## 2013-03-30 MED ORDER — FENTANYL CITRATE 0.05 MG/ML IJ SOLN
INTRAMUSCULAR | Status: AC
  Administered 2013-03-30: 11:00:00
  Filled 2013-03-30: qty 2

## 2013-03-30 MED ORDER — FENTANYL CITRATE 0.05 MG/ML IJ SOLN
INTRAMUSCULAR | Status: DC | PRN
  Administered 2013-03-30: 50 ug via INTRAVENOUS
  Administered 2013-03-30: 100 ug via INTRAVENOUS
  Administered 2013-03-30 (×2): 50 ug via INTRAVENOUS

## 2013-03-30 MED ORDER — PROPOFOL 10 MG/ML IV BOLUS
INTRAVENOUS | Status: DC | PRN
  Administered 2013-03-30: 150 mg via INTRAVENOUS

## 2013-03-30 MED ORDER — 0.9 % SODIUM CHLORIDE (POUR BTL) OPTIME
TOPICAL | Status: DC | PRN
  Administered 2013-03-30: 1000 mL

## 2013-03-30 MED ORDER — HYDROMORPHONE HCL PF 1 MG/ML IJ SOLN
2.0000 mg | Freq: Once | INTRAMUSCULAR | Status: AC
  Administered 2013-03-30: 2 mg via INTRAVENOUS

## 2013-03-30 MED ORDER — HYDROMORPHONE HCL PF 1 MG/ML IJ SOLN
1.0000 mg | INTRAMUSCULAR | Status: DC | PRN
  Administered 2013-03-30 – 2013-04-01 (×15): 1 mg via INTRAVENOUS
  Filled 2013-03-30 (×16): qty 1

## 2013-03-30 MED ORDER — INSULIN ASPART 100 UNIT/ML ~~LOC~~ SOLN
6.0000 [IU] | Freq: Three times a day (TID) | SUBCUTANEOUS | Status: DC
  Administered 2013-03-30 – 2013-04-01 (×5): 6 [IU] via SUBCUTANEOUS

## 2013-03-30 SURGICAL SUPPLY — 48 items
BANDAGE GAUZE ELAST BULKY 4 IN (GAUZE/BANDAGES/DRESSINGS) IMPLANT
BINDER BREAST XLRG (GAUZE/BANDAGES/DRESSINGS) ×2 IMPLANT
BINDER BREAST XXLRG (GAUZE/BANDAGES/DRESSINGS) ×2 IMPLANT
BLADE SURG 10 STRL SS (BLADE) ×2 IMPLANT
BLADE SURG 15 STRL LF DISP TIS (BLADE) ×1 IMPLANT
BLADE SURG 15 STRL SS (BLADE) ×1
CANISTER SUCTION 2500CC (MISCELLANEOUS) ×2 IMPLANT
CHLORAPREP W/TINT 26ML (MISCELLANEOUS) IMPLANT
CLEANER TIP ELECTROSURG 2X2 (MISCELLANEOUS) ×2 IMPLANT
CLOTH BEACON ORANGE TIMEOUT ST (SAFETY) ×2 IMPLANT
COVER SURGICAL LIGHT HANDLE (MISCELLANEOUS) ×2 IMPLANT
DECANTER SPIKE VIAL GLASS SM (MISCELLANEOUS) IMPLANT
DRAPE EXTREMITY T 121X128X90 (DRAPE) IMPLANT
DRAPE LAPAROSCOPIC ABDOMINAL (DRAPES) ×2 IMPLANT
DRSG PAD ABDOMINAL 8X10 ST (GAUZE/BANDAGES/DRESSINGS) ×2 IMPLANT
ELECT REM PT RETURN 9FT ADLT (ELECTROSURGICAL) ×2
ELECTRODE REM PT RTRN 9FT ADLT (ELECTROSURGICAL) ×1 IMPLANT
GAUZE PACKING IODOFORM 1/2 (PACKING) ×2 IMPLANT
GAUZE SPONGE 4X4 16PLY XRAY LF (GAUZE/BANDAGES/DRESSINGS) IMPLANT
GLOVE BIO SURGEON STRL SZ7 (GLOVE) ×2 IMPLANT
GLOVE BIOGEL PI IND STRL 7.0 (GLOVE) ×3 IMPLANT
GLOVE BIOGEL PI IND STRL 7.5 (GLOVE) ×1 IMPLANT
GLOVE BIOGEL PI INDICATOR 7.0 (GLOVE) ×3
GLOVE BIOGEL PI INDICATOR 7.5 (GLOVE) ×1
GLOVE SURG SS PI 7.0 STRL IVOR (GLOVE) ×6 IMPLANT
GOWN STRL NON-REIN LRG LVL3 (GOWN DISPOSABLE) ×6 IMPLANT
KIT BASIN OR (CUSTOM PROCEDURE TRAY) ×2 IMPLANT
KIT ROOM TURNOVER OR (KITS) ×2 IMPLANT
NEEDLE HYPO 25X1 1.5 SAFETY (NEEDLE) IMPLANT
NS IRRIG 1000ML POUR BTL (IV SOLUTION) ×2 IMPLANT
PACK SURGICAL SETUP 50X90 (CUSTOM PROCEDURE TRAY) ×2 IMPLANT
PAD ARMBOARD 7.5X6 YLW CONV (MISCELLANEOUS) ×4 IMPLANT
PENCIL BUTTON HOLSTER BLD 10FT (ELECTRODE) ×2 IMPLANT
SPECIMEN JAR SMALL (MISCELLANEOUS) IMPLANT
SPONGE GAUZE 4X4 12PLY (GAUZE/BANDAGES/DRESSINGS) ×2 IMPLANT
SPONGE LAP 18X18 X RAY DECT (DISPOSABLE) ×2 IMPLANT
SUT MNCRL AB 4-0 PS2 18 (SUTURE) ×2 IMPLANT
SUT VIC AB 3-0 SH 27 (SUTURE) ×1
SUT VIC AB 3-0 SH 27XBRD (SUTURE) ×1 IMPLANT
SWAB COLLECTION DEVICE MRSA (MISCELLANEOUS) IMPLANT
SYR BULB 3OZ (MISCELLANEOUS) ×2 IMPLANT
SYR CONTROL 10ML LL (SYRINGE) IMPLANT
TOWEL OR 17X24 6PK STRL BLUE (TOWEL DISPOSABLE) ×2 IMPLANT
TOWEL OR 17X26 10 PK STRL BLUE (TOWEL DISPOSABLE) ×2 IMPLANT
TUBE ANAEROBIC SPECIMEN COL (MISCELLANEOUS) IMPLANT
TUBE CONNECTING 12X1/4 (SUCTIONS) ×2 IMPLANT
WATER STERILE IRR 1000ML POUR (IV SOLUTION) IMPLANT
YANKAUER SUCT BULB TIP NO VENT (SUCTIONS) ×2 IMPLANT

## 2013-03-30 NOTE — Progress Notes (Signed)
Inpatient Diabetes Program Recommendations  AACE/ADA: New Consensus Statement on Inpatient Glycemic Control (2013)  Target Ranges:  Prepandial:   less than 140 mg/dL      Peak postprandial:   less than 180 mg/dL (1-2 hours)      Critically ill patients:  140 - 180 mg/dL     Results for Marissa Barrett, Marissa Barrett (MRN [id number]) as of 03/30/2013 12:22  Ref. Range 03/29/2013 08:27 03/29/2013 11:58 03/29/2013 17:26 03/29/2013 21:57  Glucose-Capillary Latest Range: 70-99 mg/dL 865 (H) 784 (H) 696 (H) 243 (H)    Results for Marissa Barrett, Marissa Barrett (MRN [id number]) as of 03/30/2013 12:22  Ref. Range 03/30/2013 07:38 03/30/2013 10:36 03/30/2013 11:54  Glucose-Capillary Latest Range: 70-99 mg/dL 440 (H) 102 (H) 725 (H)    Results for Marissa Barrett, Marissa Barrett (MRN [id number]) as of 03/30/2013 12:22  Ref. Range 03/29/2013 06:15  Hemoglobin A1C Latest Range: <5.7 % 13.0 (H)    **A1c results show suboptimal control at home.  CBGs continue to be elevated here in hospital as well.  **Spoke with patient about her recent A1c results.  Patient somewhat surprised to hear that her A1c was so high.  Patient states she takes her insulin as prescribed.  Does not have troubles getting her insulin and patient does have a CBG meter at home.  Reviewed A1c results with patient and reminded her that her goal A1c is 7% or less per ADA standards.  Patient stated she plans to get established with a new PCP.  Asked patient if she needed assistance with finding a new MD, however, patient stated she felt like she could find a new MD on her own.  Encouraged patient to take better control of her CBGs as this will help promote healing and help prevent further infection.  **Noted patient received a total of 53 units of Levemir yesterday.  Patient will get a total of 56 units of Levemir today.  **Recommend the following insulin adjustments:  1. Increase Levemir further to 30 units bid 2. Increase Novolog meal coverage to 6 units tid with meals   Will follow. Ambrose Finland RN, MSN, CDE Diabetes Coordinator Inpatient Diabetes Program Team Pager: [phone] (8a-10p)

## 2013-03-30 NOTE — Anesthesia Preprocedure Evaluation (Addendum)
Anesthesia Evaluation  Patient identified by MRN, date of birth, ID band Patient awake    Reviewed: Allergy & Precautions, H&P , NPO status , Patient's Chart, lab work & pertinent test results  Airway Mallampati: II TM Distance: >3 FB Neck ROM: full    Dental no notable dental hx. (+) Teeth Intact and Dental Advisory Given   Pulmonary shortness of breath and with exertion, asthma ,  breath sounds clear to auscultation  Pulmonary exam normal       Cardiovascular Exercise Tolerance: Good negative cardio ROS  Rhythm:regular Rate:Normal     Neuro/Psych negative neurological ROS  negative psych ROS   GI/Hepatic negative GI ROS, Neg liver ROS,   Endo/Other  diabetes, Well Controlled, Type 2, Oral Hypoglycemic AgentsHypothyroidism Morbid obesity  Renal/GU negative Renal ROS  negative genitourinary   Musculoskeletal   Abdominal (+) + obese,   Peds  Hematology negative hematology ROS (+)   Anesthesia Other Findings   Reproductive/Obstetrics negative OB ROS                         Anesthesia Physical Anesthesia Plan  ASA: III  Anesthesia Plan: General   Post-op Pain Management:    Induction: Intravenous  Airway Management Planned: LMA  Additional Equipment:   Intra-op Plan:   Post-operative Plan:   Informed Consent: I have reviewed the patients History and Physical, chart, labs and discussed the procedure including the risks, benefits and alternatives for the proposed anesthesia with the patient or authorized representative who has indicated his/her understanding and acceptance.   Dental Advisory Given  Plan Discussed with: CRNA and Surgeon  Anesthesia Plan Comments:         Anesthesia Quick Evaluation

## 2013-03-30 NOTE — Op Note (Signed)
Preoperative diagnosis: Left breast abscess Postoperative diagnosis: same as above Procedure: Left breast abscess incision and drainage Surgeon: Dr. Harden Mo Anesthesia: Gen. Specimens: Skin and breast tissue to pathology, cultures to microbiology Estimated blood loss: Minimal Complications: None Drains: None Sponge count correct at completion Disposition to recovery stable  Indications: This 37 year old female with diabetes who presents with a left breast abscess present for a number of days. This was attempted to be drained in the emergency room without success. This was still draining pus when I evaluated her. I discussed the need to be opened more to be adequately drained. She had eaten and I discussed doing procedure with her the following day.  Procedure: After informed consent was obtained the patient was taken to the operating room. She was already on antibiotics on the floor. Sequential compression devices were on her legs. She was placed under general anesthesia. Her left breast was prepped and draped in the standard sterile surgical fashion. Surgical timeout was performed.  She already had an ulcerated area that was open on her left breast. I made an elliptical incision encompassing necrotic skin as well as the areas that were draining purulence. This ended up being a fairly decent-sized hole. The pus was completely evacuated. Cultures were taken. I then irrigated this copiously. I then packed this with iodoform gauze. Dressings and a breast binder were then placed. She tolerated this well was extubated and transferred to recovery stable.

## 2013-03-30 NOTE — Transfer of Care (Signed)
Immediate Anesthesia Transfer of Care Note  Patient: Marissa Barrett  Procedure(s) Performed: Procedure(s): IRRIGATION AND DEBRIDEMENT LEFT BREAST ABSCESS (Left)  Patient Location: PACU  Anesthesia Type:General  Level of Consciousness: awake, alert  and oriented  Airway & Oxygen Therapy: Patient Spontanous Breathing and Patient connected to nasal cannula oxygen  Post-op Assessment: Report given to PACU RN, Post -op Vital signs reviewed and stable and Patient moving all extremities X 4  Post vital signs: Reviewed and stable  Complications: No apparent anesthesia complications

## 2013-03-30 NOTE — Anesthesia Procedure Notes (Signed)
Procedure Name: LMA Insertion Date/Time: 03/30/2013 8:56 AM Performed by: Orvilla Fus A Pre-anesthesia Checklist: Patient identified, Timeout performed, Emergency Drugs available, Suction available and Patient being monitored Patient Re-evaluated:Patient Re-evaluated prior to inductionOxygen Delivery Method: Circle system utilized Preoxygenation: Pre-oxygenation with 100% oxygen Intubation Type: IV induction Ventilation: Mask ventilation without difficulty LMA: LMA inserted LMA Size: 4.0 Number of attempts: 1 Placement Confirmation: positive ETCO2 and breath sounds checked- equal and bilateral Tube secured with: Tape Dental Injury: Teeth and Oropharynx as per pre-operative assessment

## 2013-03-30 NOTE — Anesthesia Postprocedure Evaluation (Signed)
  Anesthesia Post-op Note  Patient: Marissa Barrett  Procedure(s) Performed: Procedure(s) (LRB): IRRIGATION AND DEBRIDEMENT LEFT BREAST ABSCESS (Left)  Patient Location: PACU  Anesthesia Type: General  Level of Consciousness: awake and alert   Airway and Oxygen Therapy: Patient Spontanous Breathing  Post-op Pain: mild  Post-op Assessment: Post-op Vital signs reviewed, Patient's Cardiovascular Status Stable, Respiratory Function Stable, Patent Airway and No signs of Nausea or vomiting  Last Vitals:  Filed Vitals:   03/30/13 1017  BP: 156/104  Pulse: 88  Temp:   Resp: 12    Post-op Vital Signs: stable   Complications: No apparent anesthesia complications

## 2013-03-30 NOTE — Preoperative (Signed)
Beta Blockers   Reason not to administer Beta Blockers:Not Applicable 

## 2013-03-30 NOTE — Progress Notes (Signed)
Patient ID: Marissa Barrett  female  EAV:[id number]    DOB: [birth year]    DOA: 03/28/2013  PCP: Ahmed Prima, MD  Assessment/Plan: Principal Problem: Left Breast abscess with sternal pain: Patient reports started as a small bump, denied any trauma.   - Patient had I&D of the abscess done on 03/26/13 in the ER, apparently patient was given clindamycin after wound packing, no cx were sent.   - Appreciate surgery for seeing the patient,s/p left breast abscess incision and drainage today, follow cultures - Continue IV vancomycin and Zosyn - Bone scan negative for any sternal osteomyelitis  - strongly recommended mammogram after the abscess has completely resolved     Active Problems:   Diabetic gastroparesis associated with type 2 diabetes mellitus: - Uncontrolled hemoglobin A1c 13.0  - Increased levemir to 30 units BID, inc meal coverage to 6 units TID, moderate SSI    Hypothyroidism: TSH 16.7 - Currently on Synthroid 200 mcg daily  - Outpatient increase dose by PCP or endocrinologist.   DVT Prophylaxis:  Code Status:  Disposition:    Subjective: Seen patient after surgery, hungry, wants pain medication  Objective: Weight change: 0 kg (0 lb)  Intake/Output Summary (Last 24 hours) at 03/30/13 1402 Last data filed at 03/30/13 1100  Gross per 24 hour  Intake 2537.5 ml  Output      0 ml  Net 2537.5 ml   Blood pressure 142/89, pulse 82, temperature 98 F (36.7 C), temperature source Oral, resp. rate 16, height 5\' 4"  (1.626 m), weight 104.3 kg (229 lb 15 oz), last menstrual period 02/15/2003, SpO2 98.00%.  Physical Exam: General: Alert and awake, oriented x3 CVS: S1-S2 clear, no murmur rubs or gallops Chest: CTAB, Dressing intact Abdomen: soft nontender, nondistended, normal bowel sounds  Extremities: no cyanosis, clubbing or edema noted bilaterally   Lab Results: Basic Metabolic Panel:  Recent Labs Lab 03/29/13 0615 03/30/13 0545  NA 135 136  K 3.7 3.8  CL 101 101  CO2  25 23  GLUCOSE 332* 282*  BUN 10 8  CREATININE 0.38* 0.38*  CALCIUM 8.8 8.7   Liver Function Tests: No results found for this basename: AST, ALT, ALKPHOS, BILITOT, PROT, ALBUMIN,  in the last 168 hours No results found for this basename: LIPASE, AMYLASE,  in the last 168 hours No results found for this basename: AMMONIA,  in the last 168 hours CBC:  Recent Labs Lab 03/29/13 0002 03/29/13 0615 03/30/13 0545  WBC 6.5 6.6 6.1  NEUTROABS 3.7  --   --   HGB 13.2 12.2 12.4  HCT 38.4 35.3* 35.4*  MCV 80.5 80.4 79.9  PLT 337 312 332   Cardiac Enzymes: No results found for this basename: CKTOTAL, CKMB, CKMBINDEX, TROPONINI,  in the last 168 hours BNP: No components found with this basename: POCBNP,  CBG:  Recent Labs Lab 03/29/13 1726 03/29/13 2157 03/30/13 0738 03/30/13 1036 03/30/13 1154  GLUCAP 279* 243* 280* 223* 220*     Micro Results: Recent Results (from the past 240 hour(s))  MRSA PCR SCREENING     Status: None   Collection Time    03/29/13  8:38 AM      Result Value Range Status   MRSA by PCR NEGATIVE  NEGATIVE Final   Comment:            The GeneXpert MRSA Assay (FDA     approved for NASAL specimens     only), is one component of a     comprehensive MRSA  colonization     surveillance program. It is not     intended to diagnose MRSA     infection nor to guide or     monitor treatment for     MRSA infections.    Studies/Results: Dg Chest 2 View  03/26/2013   CLINICAL DATA:  Shortness of breath. Left breast abscess drained.  EXAM: CHEST  2 VIEW  COMPARISON:  05/17/2012  FINDINGS: The heart size and mediastinal contours are within normal limits. Both lungs are clear. The visualized skeletal structures are unremarkable.  IMPRESSION: No active cardiopulmonary disease.   Electronically Signed   By: Charlett Nose M.D.   On: 03/26/2013 23:45    Medications: Scheduled Meds: . insulin aspart  0-15 Units Subcutaneous TID WC  . insulin aspart  0-5 Units  Subcutaneous QHS  . insulin aspart  4 Units Subcutaneous TID WC  . insulin detemir  28 Units Subcutaneous BID  . levothyroxine  200 mcg Oral QAC breakfast  . piperacillin-tazobactam (ZOSYN)  IV  3.375 g Intravenous Q8H  . vancomycin  1,000 mg Intravenous Q12H      LOS: 2 days   RAI,RIPUDEEP M.D. [facility] 03/30/2013, 2:02 PM Pager: [phone]  If 7PM-7AM, please contact night-coverage [email or web address] Password [email or web address]

## 2013-03-31 DIAGNOSIS — E039 Hypothyroidism, unspecified: Secondary | ICD-10-CM

## 2013-03-31 LAB — BASIC METABOLIC PANEL
BUN: 6 mg/dL (ref 6–23)
CO2: 25 mEq/L (ref 19–32)
Chloride: 100 mEq/L (ref 96–112)
GFR calc non Af Amer: >90 mL/min (ref >90)
Glucose, Bld: 205 mg/dL — ABNORMAL HIGH (ref 70–99)
Potassium: 3.9 mEq/L (ref 3.5–5.1)
Sodium: 136 mEq/L (ref 135–145)

## 2013-03-31 LAB — CBC
HCT: 38.8 % (ref 36.0–46.0)
Hemoglobin: 13.1 g/dL (ref 12.0–15.0)
RBC: 4.78 MIL/uL (ref 3.87–5.11)
WBC: 7.4 10*3/uL (ref 4.0–10.5)

## 2013-03-31 LAB — GLUCOSE, CAPILLARY
Glucose-Capillary: 211 mg/dL — ABNORMAL HIGH (ref 70–99)
Glucose-Capillary: 174 mg/dL — ABNORMAL HIGH (ref 70–99)

## 2013-03-31 LAB — VANCOMYCIN, TROUGH: Vancomycin Tr: 5 ug/mL — ABNORMAL LOW (ref 10.0–20.0)

## 2013-03-31 MED ORDER — INSULIN DETEMIR 100 UNIT/ML ~~LOC~~ SOLN
34.0000 [IU] | Freq: Two times a day (BID) | SUBCUTANEOUS | Status: DC
Start: 2013-03-31 — End: 2013-04-01
  Administered 2013-03-31 – 2013-04-01 (×2): 34 [IU] via SUBCUTANEOUS
  Filled 2013-03-31 (×3): qty 0.34

## 2013-03-31 MED ORDER — VANCOMYCIN HCL IN DEXTROSE 1-5 GM/200ML-% IV SOLN
1000.0000 mg | Freq: Three times a day (TID) | INTRAVENOUS | Status: DC
  Administered 2013-03-31 – 2013-04-01 (×2): 1000 mg via INTRAVENOUS
  Filled 2013-03-31 (×3): qty 200

## 2013-03-31 NOTE — Care Management Note (Signed)
  Page 1 of 1   03/31/2013     10:52:29 AM   CARE MANAGEMENT NOTE 03/31/2013  Patient:  Marissa Barrett, Marissa Barrett   Account Number:  [id number]  Date Initiated:  03/31/2013  Documentation initiated by:  Ronny Flurry  Subjective/Objective Assessment:     Action/Plan:   Anticipated DC Date:  04/01/2013   Anticipated DC Plan:  HOME W HOME HEALTH SERVICES         Choice offered to / List presented to:  C-1 Patient        HH arranged  HH-1 RN      [facility] agency  [facility]   Status of service:   Medicare Important Message given?   (If response is "NO", the following Medicare IM given date fields will be blank) Date Medicare IM given:   Date Additional Medicare IM given:    Discharge Disposition:    Per UR Regulation:    If discussed at Long Length of Stay Meetings, dates discussed:    Comments:  03-31-13 Facesheet information confirmed with patient , however , cell phone currently does not have a charge. Patient states her husband can assist with dressing changes . Ronny Flurry RN BSN [phone]

## 2013-03-31 NOTE — Progress Notes (Signed)
1 Day Post-Op  Subjective: Still having pain that is helped some with dilaudid.   Objective: Vital signs in last 24 hours: Temp:  [97.2 F (36.2 C)-98.1 F (36.7 C)] 98.1 F (36.7 C) (09/17 0451) Pulse Rate:  [82-95] 94 (09/17 0451) Resp:  [12-20] 20 (09/17 0451) BP: (123-158)/(79-104) 123/79 mmHg (09/17 0451) SpO2:  [93 %-98 %] 93 % (09/17 0451) Last BM Date:  (pta)  Intake/Output from previous day: 09/16 0701 - 09/17 0700 In: 1289.2 [P.O.:240; I.V.:799.2; IV Piggyback:250] Out: -  Intake/Output this shift:    PE: CHEST: Left breast with dressing in place with mild amount of blood-staining. Dressing removed and packing left in place, 3cm extension of firmness per patient is better than before surgery, no erythema or exudate  Lab Results:   Recent Labs  03/30/13 0545 03/31/13 0425  WBC 6.1 7.4  HGB 12.4 13.1  HCT 35.4* 38.8  PLT 332 340   BMET  Recent Labs  03/30/13 0545 03/31/13 0425  NA 136 136  K 3.8 3.9  CL 101 100  CO2 23 25  GLUCOSE 282* 205*  BUN 8 6  CREATININE 0.38* 0.44*  CALCIUM 8.7 9.4   PT/INR No results found for this basename: LABPROT, INR,  in the last 72 hours CMP     Component Value Date/Time   NA 136 03/31/2013 0425   K 3.9 03/31/2013 0425   CL 100 03/31/2013 0425   CO2 25 03/31/2013 0425   GLUCOSE 205* 03/31/2013 0425   BUN 6 03/31/2013 0425   CREATININE 0.44* 03/31/2013 0425   CREATININE 0.62 02/15/2012 0835   CALCIUM 9.4 03/31/2013 0425   PROT 6.3 08/05/2012 0645   ALBUMIN 2.9* 08/05/2012 0645   AST 8 08/05/2012 0645   ALT 7 08/05/2012 0645   ALKPHOS 60 08/05/2012 0645   BILITOT 0.5 08/05/2012 0645   GFRNONAA >90 03/31/2013 0425   GFRAA >90 03/31/2013 0425   Lipase     Component Value Date/Time   LIPASE 43 08/03/2012 0838       Studies/Results: Nm Bone Scan 3 Phase  03/29/2013   CLINICAL DATA:  Draining cyst left of sternum. Evaluate for osteomyelitis  EXAM: NUCLEAR MEDICINE 3-PHASE BONE SCAN  TECHNIQUE: Radionuclide  angiographic images, immediate static blood pool images, and 3-hour delayed static images were obtained of the chest wall after intravenous injection of radiopharmaceutical.  COMPARISON:  Chest radiograph 03/26/2013  RADIOPHARMACEUTICALS:  25 mCi Technetium 99 MDP  FINDINGS: Vascular phase:  No abnormal vascular flow to the sternum.  Blood pool phase: No abnormal blood pool activity in the sternum.  Delayed phase: No abnormal delayed phase activity of the sternum or ribs.  IMPRESSION: No scintigraphic evidence of osteomyelitis of the sternum.   Electronically Signed   By: Genevive Bi M.D.   On: 03/29/2013 15:46    Anti-infectives: Anti-infectives   Start     Dose/Rate Route Frequency Ordered Stop   03/29/13 1600  vancomycin (VANCOCIN) IVPB 1000 mg/200 mL premix     1,000 mg 200 mL/hr over 60 Minutes Intravenous Every 12 hours 03/29/13 0314     03/29/13 0800  piperacillin-tazobactam (ZOSYN) IVPB 3.375 g     3.375 g 12.5 mL/hr over 240 Minutes Intravenous Every 8 hours 03/29/13 0314     03/29/13 0315  vancomycin (VANCOCIN) 2,000 mg in sodium chloride 0.9 % 500 mL IVPB     2,000 mg 250 mL/hr over 120 Minutes Intravenous  Once 03/29/13 0314 03/29/13 0804   03/29/13 0315  piperacillin-tazobactam (ZOSYN) IVPB 3.375 g     3.375 g 100 mL/hr over 30 Minutes Intravenous  Once 03/29/13 0314 03/29/13 0559       Assessment/Plan 1. Left breast abscess 2. Asthma 3. Type II Diabetes Mellitus 4. Anxiety 5. Interstitial cystitis  Plan: - Post op day 1 of I&D with packing in place and no signs of infection. Afebrile and no leukocytosis.  - Pain control - pain moderately controlled with percocet and dilaudid.  - Leave dressing 1 more day and evaluate tomorrow for possible d/c; Remove packing and change idoform to regular gauze tomorrow AM at bedside.  - Glucose control per primary, currently likely higher in setting if recent infection - Home health ordered for dressing change BID   LOS: 3 days     Leona Singleton, MD 03/31/2013, 8:34 AM Pager: [phone]

## 2013-03-31 NOTE — Progress Notes (Signed)
Agree with above 

## 2013-03-31 NOTE — Progress Notes (Signed)
ANTIBIOTIC CONSULT NOTE - FOLLOW UP  Pharmacy Consult for vancomycin and Zosyn Indication: L breast abscess  Allergies  Allergen Reactions  . Kiwi Extract Anaphylaxis, Itching and Swelling    And tongue swelling  . Estradiol Swelling  . Metformin And Related Nausea And Vomiting and Other (See Comments)    Severe diarrhea    Patient Measurements: Height: 5\' 4"  (162.6 cm) Weight: 229 lb 15 oz (104.3 kg) IBW/kg (Calculated) : 54.7  Vital Signs: Temp: 98.2 F (36.8 C) (09/17 1356) Temp src: Oral (09/17 1356) BP: 127/80 mmHg (09/17 1356) Pulse Rate: 92 (09/17 1356) Intake/Output from previous day: 09/16 0701 - 09/17 0700 In: 1289.2 [P.O.:240; I.V.:799.2; IV Piggyback:250] Out: -  Intake/Output from this shift: Total I/O In: 240 [P.O.:240] Out: -   Labs:  Recent Labs  03/29/13 0615 03/30/13 0545 03/31/13 0425  WBC 6.6 6.1 7.4  HGB 12.2 12.4 13.1  PLT 312 332 340  CREATININE 0.38* 0.38* 0.44*   Estimated Creatinine Clearance: 112.1 ml/min (by C-G formula based on Cr of 0.44).  Recent Labs  03/31/13 1607  VANCOTROUGH 5.0*     Microbiology: Recent Results (from the past 720 hour(s))  MRSA PCR SCREENING     Status: None   Collection Time    03/29/13  8:38 AM      Result Value Range Status   MRSA by PCR NEGATIVE  NEGATIVE Final   Comment:            The GeneXpert MRSA Assay (FDA     approved for NASAL specimens     only), is one component of a     comprehensive MRSA colonization     surveillance program. It is not     intended to diagnose MRSA     infection nor to guide or     monitor treatment for     MRSA infections.  CULTURE, ROUTINE-ABSCESS     Status: None   Collection Time    03/30/13  9:13 AM      Result Value Range Status   Specimen Description ABSCESS BREAST LEFT   Final   Special Requests NONE   Final   Gram Stain     Final   Value: ABUNDANT WBC PRESENT,BOTH PMN AND MONONUCLEAR     NO SQUAMOUS EPITHELIAL CELLS SEEN     RARE GRAM POSITIVE  COCCI     IN PAIRS     Performed at [facility]   Culture     Final   Value: NO GROWTH 1 DAY     Performed at [facility]   Report Status PENDING   Incomplete  ANAEROBIC CULTURE     Status: None   Collection Time    03/30/13  9:13 AM      Result Value Range Status   Specimen Description ABSCESS BREAST LEFT   Final   Special Requests NONE   Final   Gram Stain     Final   Value: ABUNDANT WBC PRESENT,BOTH PMN AND MONONUCLEAR     NO SQUAMOUS EPITHELIAL CELLS SEEN     RARE GRAM POSITIVE COCCI     IN PAIRS     Performed at [facility]   Culture     Final   Value: NO ANAEROBES ISOLATED; CULTURE IN PROGRESS FOR 5 DAYS     Performed at [facility]   Report Status PENDING   Incomplete    Anti-infectives   Start     [employer]  Frequency Ordered Stop   03/29/13 1600  vancomycin (VANCOCIN) IVPB 1000 mg/200 mL premix     1,000 mg 200 mL/hr over 60 Minutes Intravenous Every 12 hours 03/29/13 0314     03/29/13 0800  piperacillin-tazobactam (ZOSYN) IVPB 3.375 g     3.375 g 12.5 mL/hr over 240 Minutes Intravenous Every 8 hours 03/29/13 0314     03/29/13 0315  vancomycin (VANCOCIN) 2,000 mg in sodium chloride 0.9 % 500 mL IVPB     2,000 mg 250 mL/hr over 120 Minutes Intravenous  Once 03/29/13 0314 03/29/13 0804   03/29/13 0315  piperacillin-tazobactam (ZOSYN) IVPB 3.375 g     3.375 g 100 mL/hr over 30 Minutes Intravenous  Once 03/29/13 0314 03/29/13 0559      Assessment: 37 yo obese female on day 3 vancomycin and Zosyn for L breast abscess s/p I&D on 9/12 and today. Renal function is stable. She is afebrile, WBC are normal, and abscess culture with no growth. Vancomycin trough is subtherapeutic at 5 on 1000 mg IV q12h. All doses charted as given. Plan is to discharge tomorrow if the wound looks okay.  Goal of Therapy:  Vancomycin trough level 15-20 mcg/ml Eradication of infection  Plan:  -Increase vancomycin to 1000 mg IV q8h -Continue Zosyn  3.375 g IV q8h to be infused over 4 hours -Monitor renal function, clinical course, and culture data  [facility], [place].D., BCPS Clinical Pharmacist Pager: [phone] 03/31/2013 5:24 PM

## 2013-03-31 NOTE — Progress Notes (Signed)
Chart reviewed.  Patient ID: Marissa Barrett  female  WUJ:[id number]    DOB: 1976-02-02    DOA: 03/28/2013  PCP: Ahmed Prima, MD  Assessment/Plan: Principal Problem: Left Breast abscess with sternal pain: Patient reports started as a small bump, denied any trauma.   - Patient had I&D of the abscess done on 03/26/13 in the ER, apparently patient was given clindamycin after wound packing, no cx were sent.   - Appreciate surgery for seeing the patient,s/p left breast abscess incision and drainage today, follow cultures - Continue IV vancomycin and Zosyn - Bone scan negative for any sternal osteomyelitis  - strongly recommended mammogram after the abscess has completely resolved Cultures pending. Possible discharge tomorrow if the wound looks okay.     Active Problems:   Diabetic gastroparesis associated with type 2 diabetes mellitus: - Uncontrolled hemoglobin A1c 13.0  - Increased levemir to 34 units BID, continue meal coverage 6 units TID, moderate SSI    Hypothyroidism: TSH 16.7 - Currently on Synthroid 200 mcg daily  Patient reports that she had been out of her Synthroid for 2 weeks. Recommend repeating in a month or 2.  Subjective: Had some nausea yesterday. Better today. Pain moderately controlled.  Objective: Weight change:   Intake/Output Summary (Last 24 hours) at 03/31/13 1537 Last data filed at 03/31/13 0930  Gross per 24 hour  Intake    730 ml  Output      0 ml  Net    730 ml   Blood pressure 127/80, pulse 92, temperature 98.2 F (36.8 C), temperature source Oral, resp. rate 18, height 5\' 4"  (1.626 m), weight 104.3 kg (229 lb 15 oz), last menstrual period 02/15/2003, SpO2 95.00%.  Physical Exam: General: Alert and awake, oriented x3 CVS: S1-S2 clear, no murmur rubs or gallops Chest: CTAB, Dressing intact Abdomen: soft nontender, nondistended, normal bowel sounds  Extremities: no cyanosis, clubbing or edema noted bilaterally  Lab Results: Basic Metabolic  Panel:  Recent Labs Lab 03/30/13 0545 03/31/13 0425  NA 136 136  K 3.8 3.9  CL 101 100  CO2 23 25  GLUCOSE 282* 205*  BUN 8 6  CREATININE 0.38* 0.44*  CALCIUM 8.7 9.4   Liver Function Tests: No results found for this basename: AST, ALT, ALKPHOS, BILITOT, PROT, ALBUMIN,  in the last 168 hours No results found for this basename: LIPASE, AMYLASE,  in the last 168 hours No results found for this basename: AMMONIA,  in the last 168 hours CBC:  Recent Labs Lab 03/29/13 0002  03/30/13 0545 03/31/13 0425  WBC 6.5  < > 6.1 7.4  NEUTROABS 3.7  --   --   --   HGB 13.2  < > 12.4 13.1  HCT 38.4  < > 35.4* 38.8  MCV 80.5  < > 79.9 81.2  PLT 337  < > 332 340  < > = values in this interval not displayed. Cardiac Enzymes: No results found for this basename: CKTOTAL, CKMB, CKMBINDEX, TROPONINI,  in the last 168 hours BNP: No components found with this basename: POCBNP,  CBG:  Recent Labs Lab 03/30/13 1154 03/30/13 1645 03/30/13 2210 03/31/13 0736 03/31/13 1140  GLUCAP 220* 163* 209* 211* 174*     Micro Results: Recent Results (from the past 240 hour(s))  MRSA PCR SCREENING     Status: None   Collection Time    03/29/13  8:38 AM      Result Value Range Status   MRSA by PCR NEGATIVE  NEGATIVE Final  Comment:            The GeneXpert MRSA Assay (FDA     approved for NASAL specimens     only), is one component of a     comprehensive MRSA colonization     surveillance program. It is not     intended to diagnose MRSA     infection nor to guide or     monitor treatment for     MRSA infections.  CULTURE, ROUTINE-ABSCESS     Status: None   Collection Time    03/30/13  9:13 AM      Result Value Range Status   Specimen Description ABSCESS BREAST LEFT   Final   Special Requests NONE   Final   Gram Stain     Final   Value: ABUNDANT WBC PRESENT,BOTH PMN AND MONONUCLEAR     NO SQUAMOUS EPITHELIAL CELLS SEEN     RARE GRAM POSITIVE COCCI     IN PAIRS     Performed at  [facility]   Culture     Final   Value: NO GROWTH 1 DAY     Performed at [facility]   Report Status PENDING   Incomplete  ANAEROBIC CULTURE     Status: None   Collection Time    03/30/13  9:13 AM      Result Value Range Status   Specimen Description ABSCESS BREAST LEFT   Final   Special Requests NONE   Final   Gram Stain     Final   Value: ABUNDANT WBC PRESENT,BOTH PMN AND MONONUCLEAR     NO SQUAMOUS EPITHELIAL CELLS SEEN     RARE GRAM POSITIVE COCCI     IN PAIRS     Performed at [facility]   Culture     Final   Value: NO ANAEROBES ISOLATED; CULTURE IN PROGRESS FOR 5 DAYS     Performed at [facility]   Report Status PENDING   Incomplete    Studies/Results: Dg Chest 2 View  03/26/2013   CLINICAL DATA:  Shortness of breath. Left breast abscess drained.  EXAM: CHEST  2 VIEW  COMPARISON:  05/17/2012  FINDINGS: The heart size and mediastinal contours are within normal limits. Both lungs are clear. The visualized skeletal structures are unremarkable.  IMPRESSION: No active cardiopulmonary disease.   Electronically Signed   By: Charlett Nose M.D.   On: 03/26/2013 23:45    Medications: Scheduled Meds: . insulin aspart  0-15 Units Subcutaneous TID WC  . insulin aspart  0-5 Units Subcutaneous QHS  . insulin aspart  6 Units Subcutaneous TID WC  . insulin detemir  30 Units Subcutaneous BID  . levothyroxine  200 mcg Oral QAC breakfast  . piperacillin-tazobactam (ZOSYN)  IV  3.375 g Intravenous Q8H  . vancomycin  1,000 mg Intravenous Q12H      LOS: 3 days   Christiane Ha M.D. [facility] 03/31/2013, 3:37 PM Pager: [phone]  If 7PM-7AM, please contact night-coverage [email or web address] Password [email or web address]

## 2013-04-01 ENCOUNTER — Encounter (HOSPITAL_COMMUNITY): Payer: Self-pay | Admitting: General Surgery

## 2013-04-01 ENCOUNTER — Telehealth (INDEPENDENT_AMBULATORY_CARE_PROVIDER_SITE_OTHER): Payer: Self-pay

## 2013-04-01 LAB — BASIC METABOLIC PANEL
BUN: 8 mg/dL (ref 6–23)
Calcium: 9.2 mg/dL (ref 8.4–10.5)
Chloride: 101 mEq/L (ref 96–112)
Creatinine, Ser: 0.47 mg/dL — ABNORMAL LOW (ref 0.50–1.10)
GFR calc Af Amer: >90 mL/min (ref >90)

## 2013-04-01 LAB — CBC
HCT: 38.5 % (ref 36.0–46.0)
MCH: 27.9 pg (ref 26.0–34.0)
MCV: 80.2 fL (ref 78.0–100.0)
RDW: 12.1 % (ref 11.5–15.5)
WBC: 6.7 10*3/uL (ref 4.0–10.5)

## 2013-04-01 MED ORDER — INSULIN DETEMIR 100 UNIT/ML ~~LOC~~ SOLN: 36.0000 [IU] | Freq: Two times a day (BID) | SUBCUTANEOUS | Status: DC

## 2013-04-01 MED ORDER — OXYCODONE-ACETAMINOPHEN 5-325 MG PO TABS: 2.0000 | ORAL_TABLET | ORAL | Status: DC | PRN

## 2013-04-01 MED ORDER — INSULIN LISPRO 100 UNIT/ML ~~LOC~~ SOLN: 8.0000 [IU] | Freq: Three times a day (TID) | SUBCUTANEOUS | Status: AC

## 2013-04-01 MED ORDER — DOXYCYCLINE HYCLATE 100 MG PO TABS: 100.0000 mg | ORAL_TABLET | Freq: Two times a day (BID) | ORAL | Status: DC

## 2013-04-01 MED ORDER — AMOXICILLIN-POT CLAVULANATE 875-125 MG PO TABS: 1.0000 | ORAL_TABLET | Freq: Two times a day (BID) | ORAL | Status: DC

## 2013-04-01 MED ORDER — INSULIN DETEMIR 100 UNIT/ML ~~LOC~~ SOLN: 36.0000 [IU] | Freq: Two times a day (BID) | SUBCUTANEOUS | Status: AC

## 2013-04-01 NOTE — Progress Notes (Signed)
[facility]  SURGICAL [id number] [place] [address] Phone: [phone]  April 01, 2013  Patient: Marissa Barrett  Date of Birth: 1976/06/18  Date of Visit: 03/28/2013    To Whom It May Concern:  Treniya Lobb was seen and treated in our emergency department on 03/28/2013. Shaylyn Bawa  may return to work on 04/05/13.  Sincerely,     Crista Curb, M.D.

## 2013-04-01 NOTE — Telephone Encounter (Signed)
Kelly the PA called stating to make appt to be overbook on 9-30 at 1:30pm. She is aware schedule full.

## 2013-04-01 NOTE — Progress Notes (Signed)
Patient ID: Marissa Barrett, female   DOB: [birth year], 37 y.o.   MRN: [id number] 2 Days Post-Op  Subjective: Pt feels ok.  Some pain.  Objective: Vital signs in last 24 hours: Temp:  [98 F (36.7 C)-98.6 F (37 C)] 98 F (36.7 C) (09/18 0524) Pulse Rate:  [90-97] 90 (09/18 0524) Resp:  [18-20] 18 (09/18 0524) BP: (125-141)/(68-81) 125/68 mmHg (09/18 0524) SpO2:  [93 %-97 %] 93 % (09/18 0524) Last BM Date:  (pta)  Intake/Output from previous day: 09/17 0701 - 09/18 0700 In: 240 [P.O.:240] Out: -  Intake/Output this shift:    PE: Breast: left breast with packing removed.  Wound is clean with no evidence of further infection.  Minimal induration.  No remaining cellulitis.  Lab Results:   Recent Labs  03/31/13 0425 04/01/13 0430  WBC 7.4 6.7  HGB 13.1 13.4  HCT 38.8 38.5  PLT 340 370   BMET  Recent Labs  03/31/13 0425 04/01/13 0430  NA 136 136  K 3.9 4.0  CL 100 101  CO2 25 26  GLUCOSE 205* 277*  BUN 6 8  CREATININE 0.44* 0.47*  CALCIUM 9.4 9.2   PT/INR No results found for this basename: LABPROT, INR,  in the last 72 hours CMP     Component Value Date/Time   NA 136 04/01/2013 0430   K 4.0 04/01/2013 0430   CL 101 04/01/2013 0430   CO2 26 04/01/2013 0430   GLUCOSE 277* 04/01/2013 0430   BUN 8 04/01/2013 0430   CREATININE 0.47* 04/01/2013 0430   CREATININE 0.62 02/15/2012 0835   CALCIUM 9.2 04/01/2013 0430   PROT 6.3 08/05/2012 0645   ALBUMIN 2.9* 08/05/2012 0645   AST 8 08/05/2012 0645   ALT 7 08/05/2012 0645   ALKPHOS 60 08/05/2012 0645   BILITOT 0.5 08/05/2012 0645   GFRNONAA >90 04/01/2013 0430   GFRAA >90 04/01/2013 0430   Lipase     Component Value Date/Time   LIPASE 43 08/03/2012 0838       Studies/Results: No results found.  Anti-infectives: Anti-infectives   Start     Dose/Rate Route Frequency Ordered Stop   04/01/13 0000  vancomycin (VANCOCIN) IVPB 1000 mg/200 mL premix     1,000 mg 200 mL/hr over 60 Minutes Intravenous Every 8 hours 03/31/13  1739     03/29/13 1600  vancomycin (VANCOCIN) IVPB 1000 mg/200 mL premix  Status:  Discontinued     1,000 mg 200 mL/hr over 60 Minutes Intravenous Every 12 hours 03/29/13 0314 03/31/13 1739   03/29/13 0800  piperacillin-tazobactam (ZOSYN) IVPB 3.375 g     3.375 g 12.5 mL/hr over 240 Minutes Intravenous Every 8 hours 03/29/13 0314     03/29/13 0315  vancomycin (VANCOCIN) 2,000 mg in sodium chloride 0.9 % 500 mL IVPB     2,000 mg 250 mL/hr over 120 Minutes Intravenous  Once 03/29/13 0314 03/29/13 0804   03/29/13 0315  piperacillin-tazobactam (ZOSYN) IVPB 3.375 g     3.375 g 100 mL/hr over 30 Minutes Intravenous  Once 03/29/13 0314 03/29/13 0559       Assessment/Plan  1. Left breast abscess, s/p I&D 2. DM  Plan:  1. Patient stable for dc home from our standpoint 2. Cultures are still pending, but prelim show some rare g+ cocci.  Broad spectrum coverage is ok given cultures and DM.  D/w Dr. Lendell Caprice 3. WD dressing changes at home.  Husband knows how to do this. 4. Follow up in clinic in 2  weeks   LOS: 4 days    Marissa Barrett E 04/01/2013, 9:43 AM Pager: [phone]

## 2013-04-01 NOTE — Progress Notes (Signed)
Pt and family given discharge instructions.  They verbalized understanding of all instructions and follow-ups.  We also discussed her dressing change and handout given.  Extra gauze, saline and abd pad given as ordered, to last her until Home Health sees her.  Ronny Flurry, RN CM stated that [facility] was set up and ready.  Pt ready for discharge home.  Taken downstairs via wheelchair to go home with family.

## 2013-04-01 NOTE — Discharge Summary (Signed)
Physician Discharge Summary  Marissa Barrett XBJ:[id number] DOB: [birth year] DOA: 03/28/2013  PCP: Ahmed Prima, MD  Admit date: 03/28/2013 Discharge date: 04/01/2013  Time spent: greater than 30 min  Recommendations for Outpatient Follow-up:  Optimize Diabetes control.  Monitor wound healing.  Repeat TSH in 1-2 months (TSH high, but had run out of synthroid PTA).  mammogram after abscess healed  Discharge Diagnoses:  Principal Problem:   Breast abscess Active Problems:   Interstitial cystitis   Diabetic gastroparesis associated with type 2 diabetes mellitus   Sternal pain   Hypothyroidism   Discharge Condition: stable  Filed Weights   03/29/13 0300 03/29/13 1002  Weight: 104.3 kg (229 lb 15 oz) 104.3 kg (229 lb 15 oz)    History of present illness:  Marissa Barrett is a 37 y.o. female who was recently in the hospital ER 2 days ago for left breast abscess which was drained and was discharged home with clindamycin and was advised to follow. On follow up patient was found to have sternal tenderness. Patient states started a week ago and her breast abscess began 2 weeks ago. On exam patient has significant tenderness in the left breast area medial aspect and also the sternal area. Patient does not look toxic. At this time radiologist has recommended three-phase bone scan to rule out osteomyelitis of sternum since patient cannot have MRI as patient has metallic wires placed for her interstitial cystitis. Patient denies any nausea vomiting abdominal pain difficulty breathing.   Hospital Course:  Left Breast abscess:  Started on vancomycin and zosyn.  Patient had I&D of the abscess done on 03/26/13 in the ER PTA, apparently patient was given clindamycin after wound packing, no cx were sent. Surgery consulted and took patient to OR for I&D.  Bone scan negative for any sternal osteomyelitis.  Cultures negative, but had been on antibiotics  Diabetic gastroparesis associated with type 2 diabetes mellitus:   - Uncontrolled hemoglobin A1c 13.0.  Increased levemir and lispro.  Needs better control  Hypothyroidism: TSH 16.7  - Currently on Synthroid 200 mcg daily  Patient reports that she had been out of her Synthroid for 2 weeks. Recommend repeating in a month or 2.   Procedures:  I&D breast abscess  Consultations:  General surgery  Discharge Exam: Filed Vitals:   04/01/13 0524  BP: 125/68  Pulse: 90  Temp: 98 F (36.7 C)  Resp: 18    General: nontoxic. Alert and oriented Breast:  Deep wound. No cellulitis  Discharge Instructions  Discharge Orders   Future Appointments Provider Department Dept Phone   04/13/2013 1:30 PM [facility], [place] [phone]   Future Orders Complete By Expires   Diet Carb Modified  As directed    Discharge instructions  As directed    Comments:     Monitor blood glucose at least 3 times daily before meals, record and bring to follow up visit with your primary care provider   Discharge wound care:  As directed    Comments:     Pack wound with saline soaked gauze twice daily.  Keep clean and dry.   Driving Restrictions  As directed    Comments:     No driving while on pain medications   Increase activity slowly  As directed        Medication List    STOP taking these medications       clindamycin 150 MG capsule  Commonly known as:  CLEOCIN  TAKE these medications       acetaminophen 500 MG tablet  Commonly known as:  TYLENOL  Take 500 mg by mouth every 6 (six) hours as needed for pain.     albuterol 108 (90 BASE) MCG/ACT inhaler  Commonly known as:  PROVENTIL HFA;VENTOLIN HFA  Inhale 2 puffs into the lungs every 6 (six) hours as needed for wheezing or shortness of breath.     amoxicillin-clavulanate 875-125 MG per tablet  Commonly known as:  AUGMENTIN  Take 1 tablet by mouth 2 (two) times daily.     doxycycline 100 MG tablet  Commonly known as:  VIBRA-TABS  Take 1 tablet (100 mg total) by  mouth 2 (two) times daily.     insulin detemir 100 UNIT/ML injection  Commonly known as:  LEVEMIR  Inject 0.36 mLs (36 Units total) into the skin 2 (two) times daily.     insulin lispro 100 UNIT/ML injection  Commonly known as:  HUMALOG  Inject 8 Units into the skin 3 (three) times daily before meals.     levothyroxine 200 MCG tablet  Commonly known as:  SYNTHROID, LEVOTHROID  Take 200 mcg by mouth daily before breakfast. Take on an empty stomach     ondansetron 8 MG tablet  Commonly known as:  ZOFRAN  Take 8 mg by mouth every 8 (eight) hours as needed for nausea.     oxyCODONE-acetaminophen 5-325 MG per tablet  Commonly known as:  PERCOCET/ROXICET  Take 2 tablets by mouth every 4 (four) hours as needed for pain.     promethazine 25 MG tablet  Commonly known as:  PHENERGAN  Take 25 mg by mouth every 6 (six) hours as needed for nausea.     ULTRAM 50 MG tablet  Generic drug:  traMADol  Take 50 mg by mouth every 6 (six) hours as needed for pain.       Allergies  Allergen Reactions  . Kiwi Extract Anaphylaxis, Itching and Swelling    And tongue swelling  . Estradiol Swelling  . Metformin And Related Nausea And Vomiting and Other (See Comments)    Severe diarrhea       Follow-up Information   Follow up with [facility] On 04/13/2013. (1:30, arrive at 1:00 to fill out paperwok)    Contact information:   [address]   [place] [address] [phone]       Follow up with LEE,HOLLY, MD In 1 week. (for diabetes control)    Specialty:  Family Medicine   Contact information:   [address] [place] [address] [phone]        The results of significant diagnostics from this hospitalization (including imaging, microbiology, ancillary and laboratory) are listed below for reference.    Significant Diagnostic Studies: Dg Chest 2 View  03/26/2013   CLINICAL DATA:  Shortness of breath. Left breast abscess drained.  EXAM: CHEST  2  VIEW  COMPARISON:  05/17/2012  FINDINGS: The heart size and mediastinal contours are within normal limits. Both lungs are clear. The visualized skeletal structures are unremarkable.  IMPRESSION: No active cardiopulmonary disease.   Electronically Signed   By: Charlett Nose M.D.   On: 03/26/2013 23:45   Nm Bone Scan 3 Phase  03/29/2013   CLINICAL DATA:  Draining cyst left of sternum. Evaluate for osteomyelitis  EXAM: NUCLEAR MEDICINE 3-PHASE BONE SCAN  TECHNIQUE: Radionuclide angiographic images, immediate static blood pool images, and 3-hour delayed static images  were obtained of the chest wall after intravenous injection of radiopharmaceutical.  COMPARISON:  Chest radiograph 03/26/2013  RADIOPHARMACEUTICALS:  25 mCi Technetium 99 MDP  FINDINGS: Vascular phase:  No abnormal vascular flow to the sternum.  Blood pool phase: No abnormal blood pool activity in the sternum.  Delayed phase: No abnormal delayed phase activity of the sternum or ribs.  IMPRESSION: No scintigraphic evidence of osteomyelitis of the sternum.   Electronically Signed   By: Genevive Bi M.D.   On: 03/29/2013 15:46    Microbiology: Recent Results (from the past 240 hour(s))  MRSA PCR SCREENING     Status: None   Collection Time    03/29/13  8:38 AM      Result Value Range Status   MRSA by PCR NEGATIVE  NEGATIVE Final   Comment:            The GeneXpert MRSA Assay (FDA     approved for NASAL specimens     only), is one component of a     comprehensive MRSA colonization     surveillance program. It is not     intended to diagnose MRSA     infection nor to guide or     monitor treatment for     MRSA infections.  CULTURE, ROUTINE-ABSCESS     Status: None   Collection Time    03/30/13  9:13 AM      Result Value Range Status   Specimen Description ABSCESS BREAST LEFT   Final   Special Requests NONE   Final   Gram Stain     Final   Value: ABUNDANT WBC PRESENT,BOTH PMN AND MONONUCLEAR     NO SQUAMOUS EPITHELIAL CELLS SEEN      RARE GRAM POSITIVE COCCI     IN PAIRS     Performed at [facility]   Culture     Final   Value: NO GROWTH 2 DAYS     Performed at [facility]   Report Status PENDING   Incomplete  ANAEROBIC CULTURE     Status: None   Collection Time    03/30/13  9:13 AM      Result Value Range Status   Specimen Description ABSCESS BREAST LEFT   Final   Special Requests NONE   Final   Gram Stain     Final   Value: ABUNDANT WBC PRESENT,BOTH PMN AND MONONUCLEAR     NO SQUAMOUS EPITHELIAL CELLS SEEN     RARE GRAM POSITIVE COCCI     IN PAIRS     Performed at [facility]   Culture     Final   Value: NO ANAEROBES ISOLATED; CULTURE IN PROGRESS FOR 5 DAYS     Performed at [facility]   Report Status PENDING   Incomplete     Labs: Basic Metabolic Panel:  Recent Labs Lab 03/29/13 0002 03/29/13 0615 03/30/13 0545 03/31/13 0425 04/01/13 0430  NA 135 135 136 136 136  K 3.6 3.7 3.8 3.9 4.0  CL 100 101 101 100 101  CO2 23 25 23 25 26   GLUCOSE 279* 332* 282* 205* 277*  BUN 10 10 8 6 8   CREATININE 0.37* 0.38* 0.38* 0.44* 0.47*  CALCIUM 9.1 8.8 8.7 9.4 9.2   Liver Function Tests: No results found for this basename: AST, ALT, ALKPHOS, BILITOT, PROT, ALBUMIN,  in the last 168 hours No results found for this basename: LIPASE, AMYLASE,  in the last 168 hours  No results found for this basename: AMMONIA,  in the last 168 hours CBC:  Recent Labs Lab 03/29/13 0002 03/29/13 0615 03/30/13 0545 03/31/13 0425 04/01/13 0430  WBC 6.5 6.6 6.1 7.4 6.7  NEUTROABS 3.7  --   --   --   --   HGB 13.2 12.2 12.4 13.1 13.4  HCT 38.4 35.3* 35.4* 38.8 38.5  MCV 80.5 80.4 79.9 81.2 80.2  PLT 337 312 332 340 370   Cardiac Enzymes: No results found for this basename: CKTOTAL, CKMB, CKMBINDEX, TROPONINI,  in the last 168 hours BNP: BNP (last 3 results) No results found for this basename: PROBNP,  in the last 8760 hours CBG:  Recent Labs Lab 03/31/13 0736 03/31/13 1140  03/31/13 1645 03/31/13 2152 04/01/13 0751  GLUCAP 211* 174* 224* 176* 220*   Signed:  Saprina Chuong L  [facility] 04/01/2013, 10:50 AM

## 2013-04-02 LAB — CULTURE, ROUTINE-ABSCESS: Culture: NO GROWTH

## 2013-04-04 LAB — ANAEROBIC CULTURE

## 2013-04-13 ENCOUNTER — Encounter (INDEPENDENT_AMBULATORY_CARE_PROVIDER_SITE_OTHER): Payer: Self-pay

## 2013-04-13 ENCOUNTER — Ambulatory Visit (INDEPENDENT_AMBULATORY_CARE_PROVIDER_SITE_OTHER): Payer: Medicaid Other | Admitting: General Surgery

## 2013-04-13 VITALS — BP 119/76 | HR 66 | Temp 98.0°F | Resp 14 | Ht 64.0 in | Wt 231.6 lb

## 2013-04-13 DIAGNOSIS — N611 Abscess of the breast and nipple: Secondary | ICD-10-CM

## 2013-04-13 DIAGNOSIS — N61 Mastitis without abscess: Secondary | ICD-10-CM

## 2013-04-13 MED ORDER — OXYCODONE-ACETAMINOPHEN 5-325 MG PO TABS: 2.0000 | ORAL_TABLET | ORAL | Status: AC | PRN

## 2013-04-13 NOTE — Patient Instructions (Addendum)
Once daily wet to dry dressing changes.  As needed pain medication.  Follow up in 2-3 weeks

## 2013-04-13 NOTE — Progress Notes (Signed)
Subjective: left breast abscess     Patient ID: Marissa Barrett, female   DOB: February 21, 1976, 37 y.o.   MRN: [id number]  HPI Marissa Barrett is a 37 y.o. female with a history of diabetes mellitus who was hospitalized for left sided sternal pain with questionable osteomyelitis.  Bone scan did not show osteomyelitis, rather a abscess upon [facility] exam.  She underwent I&D by Dr. Dwain Sarna.  She was discharged with antibiotics and bid wet to dry dressing.  Her pain is minimal.  Denies f/c/s.  Back to normal activities.    Review of Systems  Constitutional: Negative for fever and chills.  Gastrointestinal: Positive for nausea. Negative for vomiting.       Objective:   Physical Exam  Constitutional: She appears well-developed and well-nourished. No distress.  Skin: She is not diaphoretic.  Left breast-beefy red, no exudate or odor, no erythema, fluctuance.  She experienced mild tenderness when removing the dressing.       Assessment:     Left breast abscess    Plan:     Pathology report reviewed which showed inflammation, abscess, no malignancy.  She may reduce to once daily wet to dry dressing changes.  She was given a RX for 30 tablets of norco.  She will follow  Up in 2 weeks for a hopefully last wound check.  She follows up with pcp routinely and is being referred to Endo for diabetes.

## 2013-04-27 ENCOUNTER — Ambulatory Visit (INDEPENDENT_AMBULATORY_CARE_PROVIDER_SITE_OTHER): Payer: Medicaid Other | Admitting: General Surgery

## 2013-04-27 ENCOUNTER — Encounter (INDEPENDENT_AMBULATORY_CARE_PROVIDER_SITE_OTHER): Payer: Self-pay

## 2013-04-27 VITALS — BP 140/90 | HR 82 | Temp 97.9°F | Resp 16 | Ht 60.0 in | Wt 231.4 lb

## 2013-04-27 DIAGNOSIS — N611 Abscess of the breast and nipple: Secondary | ICD-10-CM

## 2013-04-27 DIAGNOSIS — N61 Mastitis without abscess: Secondary | ICD-10-CM

## 2013-04-27 NOTE — Patient Instructions (Signed)
Continue to keep site clean and a dressing over it.  Call if you have a problem.

## 2013-04-27 NOTE — Progress Notes (Signed)
  Subjective: Marissa Barrett is a 37 y.o. female with a history of diabetes mellitus who was hospitalized for left sided sternal pain with questionable osteomyelitis. Bone scan did not show osteomyelitis, rather a abscess upon [facility] exam. She underwent I&D by Dr. Dwain Sarna. She was discharged with antibiotics and bid wet to dry dressing. Her pain is minimal. Denies f/c/s. Back to normal activities.   Here for follow up wound check. She has done well since her last visit.   Objective: Vital signs in last 24 hours: BP 140/90  Pulse 82  Temp(Src) 97.9 F (36.6 C) (Temporal)  Resp 16  Ht 5' (1.524 m)  Wt 104.962 kg (231 lb 6.4 oz)  BMI 45.19 kg/m2  LMP 02/15/2003 Prior to Admission medications   Medication Sig Start Date End Date Taking? Authorizing Provider  acetaminophen (TYLENOL) 500 MG tablet Take 500 mg by mouth every 6 (six) hours as needed for pain.    Yes Historical Provider, MD  albuterol (PROVENTIL HFA;VENTOLIN HFA) 108 (90 BASE) MCG/ACT inhaler Inhale 2 puffs into the lungs every 6 (six) hours as needed for wheezing or shortness of breath.    Yes Historical Provider, MD  insulin detemir (LEVEMIR) 100 UNIT/ML injection Inject 0.36 mLs (36 Units total) into the skin 2 (two) times daily. 04/01/13  Yes Christiane Ha, MD  insulin lispro (HUMALOG) 100 UNIT/ML injection Inject 8 Units into the skin 3 (three) times daily before meals. 04/01/13  Yes Christiane Ha, MD  levothyroxine (SYNTHROID, LEVOTHROID) 200 MCG tablet Take 200 mcg by mouth daily before breakfast. Take on an empty stomach 04/30/12  Yes Historical Provider, MD  ondansetron (ZOFRAN) 8 MG tablet Take 8 mg by mouth every 8 (eight) hours as needed for nausea.    Yes Historical Provider, MD  traMADol (ULTRAM) 50 MG tablet Take 50 mg by mouth every 6 (six) hours as needed for pain.  10/12/12  Yes Historical Provider, MD  oxyCODONE-acetaminophen (PERCOCET/ROXICET) 5-325 MG per tablet Take 2 tablets by mouth every 4 (four) hours as  needed for pain. 04/13/13   Emina Riebock, NP  promethazine (PHENERGAN) 25 MG tablet Take 25 mg by mouth every 6 (six) hours as needed for nausea.  10/12/12   Historical Provider, MD        General appearance: alert, cooperative and no distress Skin: Open site left breast is about 1 cm x 1.5 cm almost healed up to epidermis.  Pink and clean.        Assessment/Plan Left breast abscess Diabetic gastroparesis associated with type 2 diabetes mellitus  Sternal pain  Hypothyroidism   Plan:  She can return as needed.  Continue current wound care.  We talked about her glucose control and she is working on this.       Randeep Biondolillo 04/27/2013

## 2014-02-02 IMAGING — CT CT HEAD W/O CM
1 series · 16 of 30 positions shown, 20 images · non-contrast
Comparison: None.

CLINICAL DATA: Nausea vomiting and headache

CT HEAD WITHOUT CONTRAST
TECHNIQUE: Contiguous axial images were obtained from the base of
the skull through the vertex without contrast

[Series 2: head routine 4.8 h37s · axial · 0.47mm/px · z∈[-134,+18]mm · 16 of 36 slices shown, 20 images]
[im 2/36  brain]
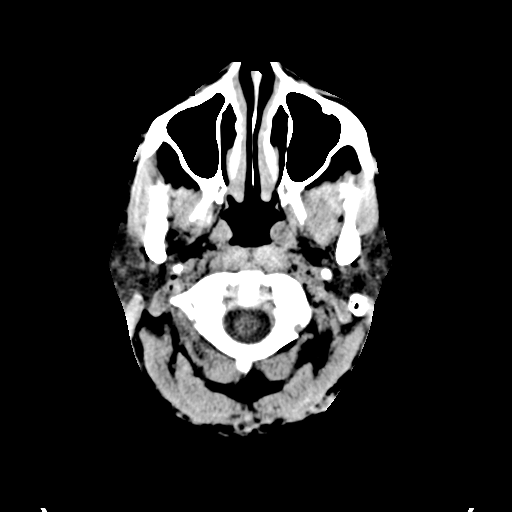
[im 2/36  bone]
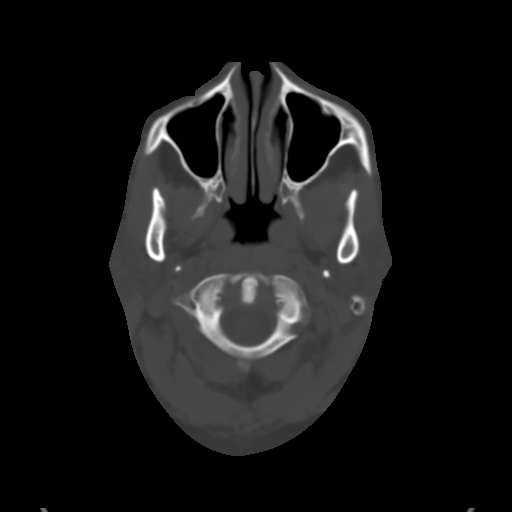
[im 4/36  brain]
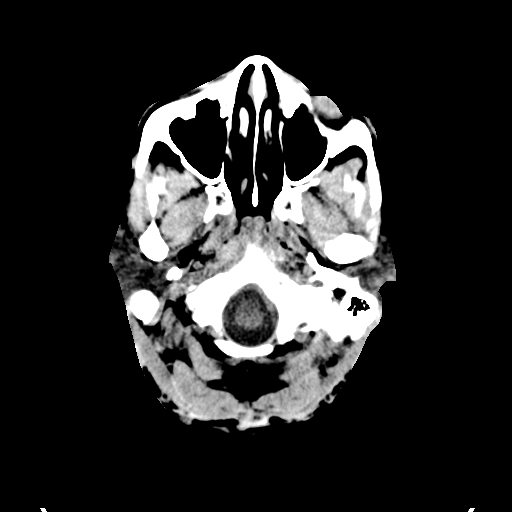
[im 7/36  brain]
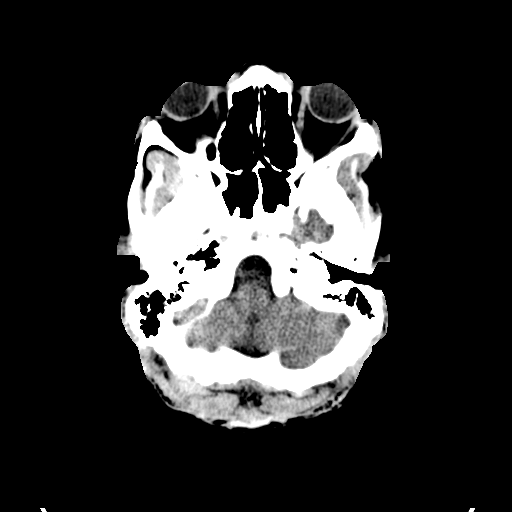
[im 9/36  brain]
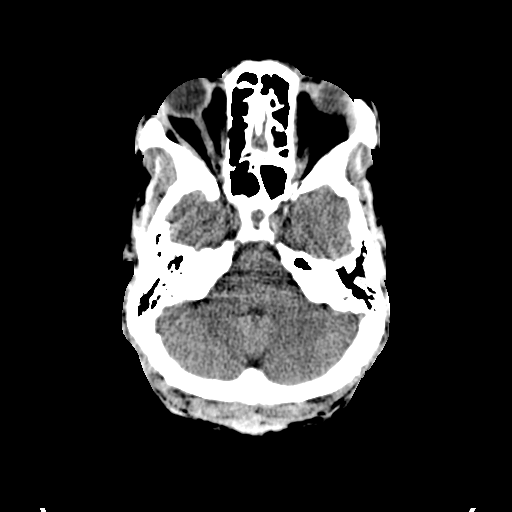
[im 10/36  brain]
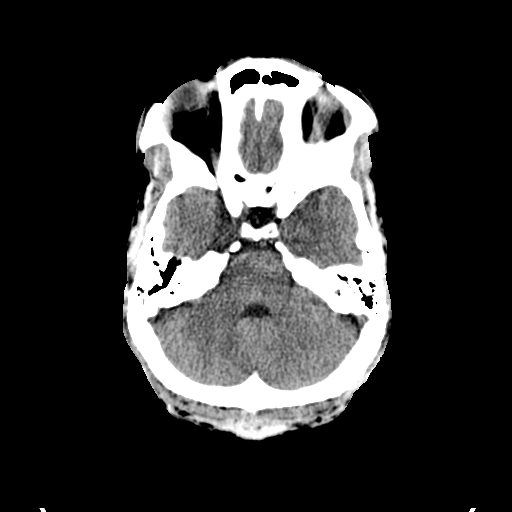
[im 10/36  bone]
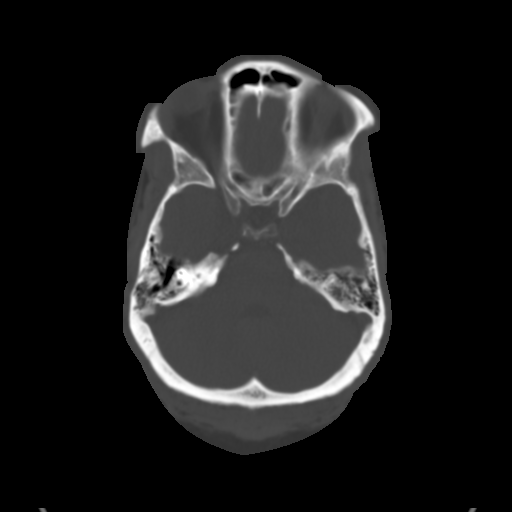
[im 13/36  brain]
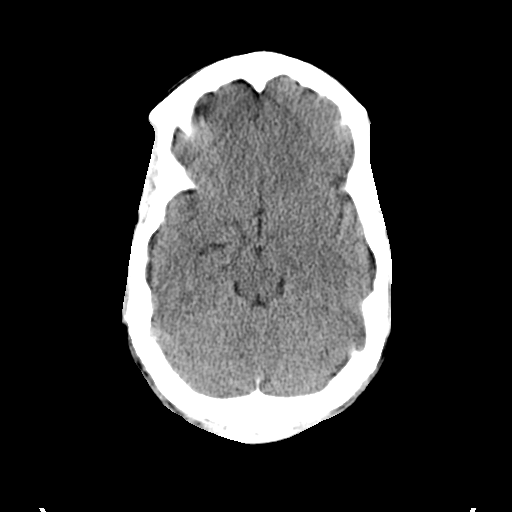
[im 15/36  brain]
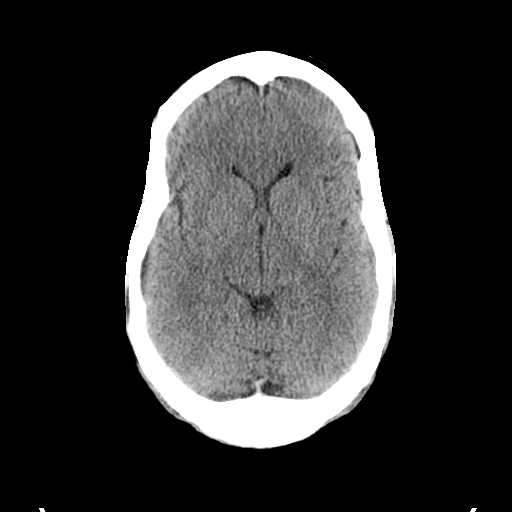
[im 17/36  brain]
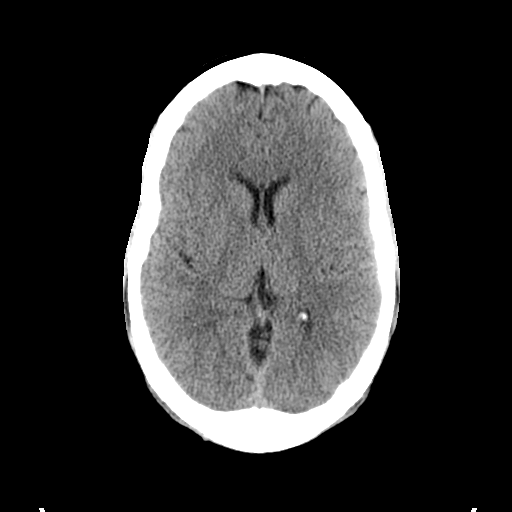
[im 19/36  brain]
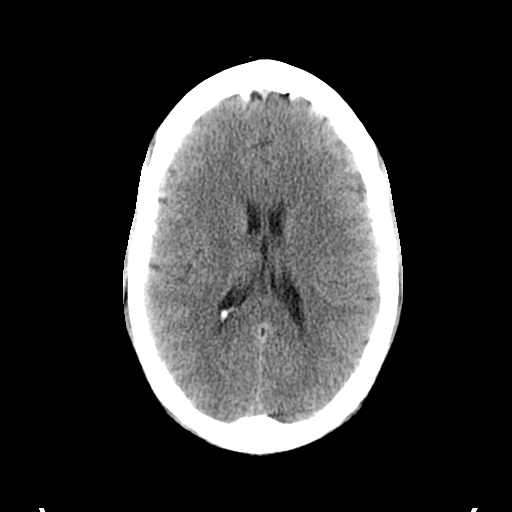
[im 19/36  bone]
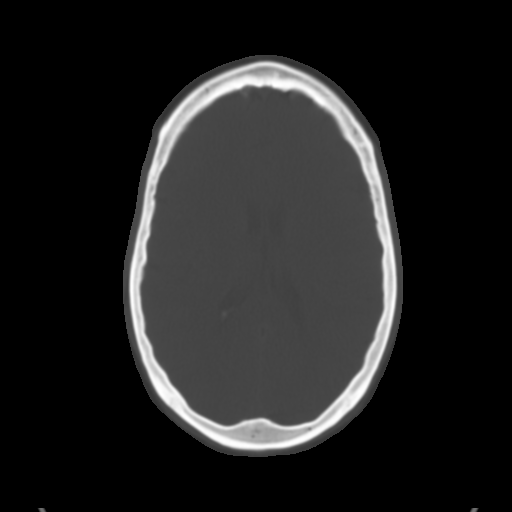
[im 21/36  brain]
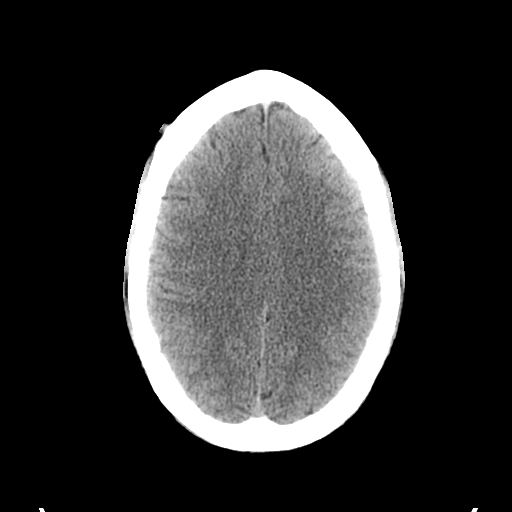
[im 23/36  brain]
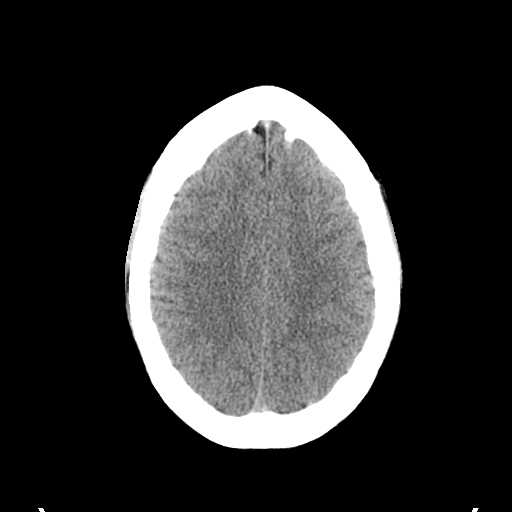
[im 26/36  brain]
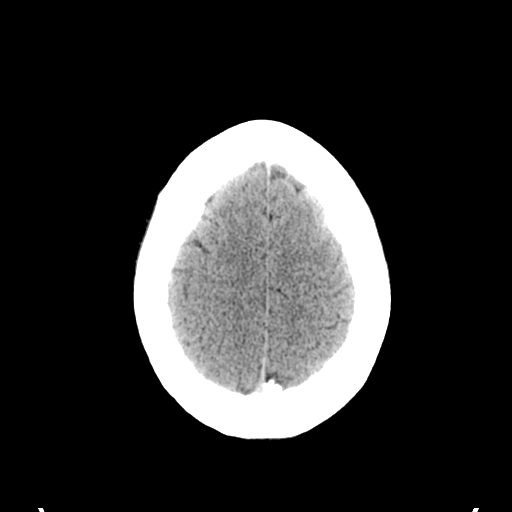
[im 27/36  brain]
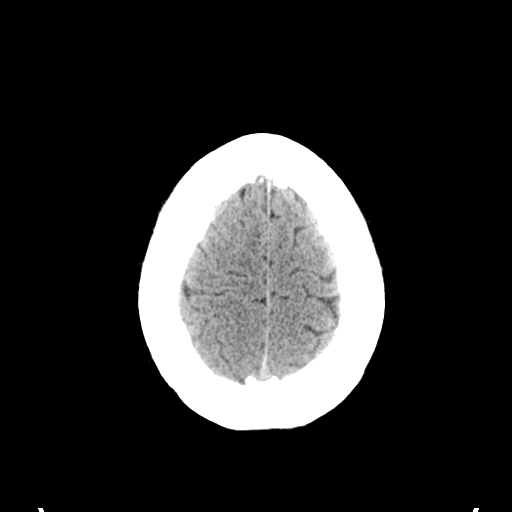
[im 27/36  bone]
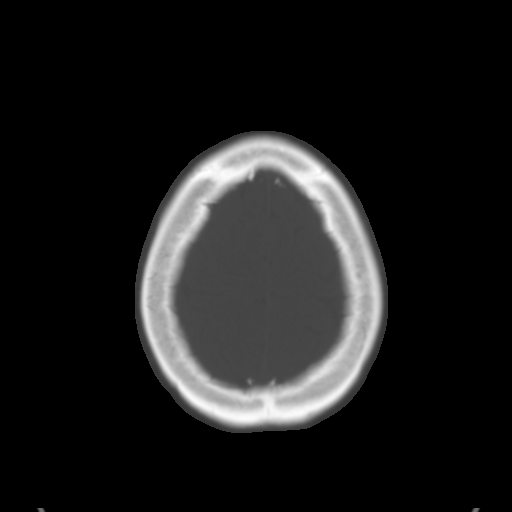
[im 29/36  brain]
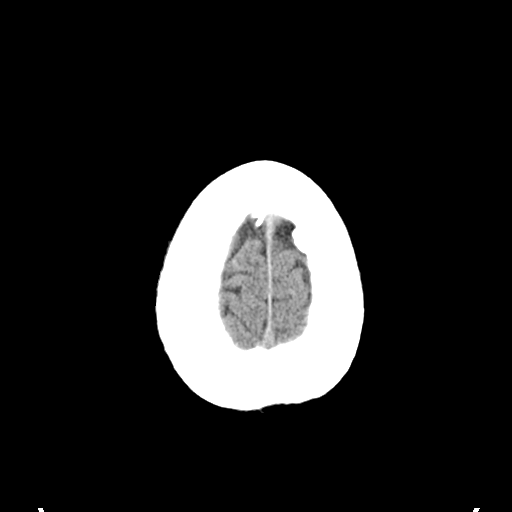
[im 32/36  brain]
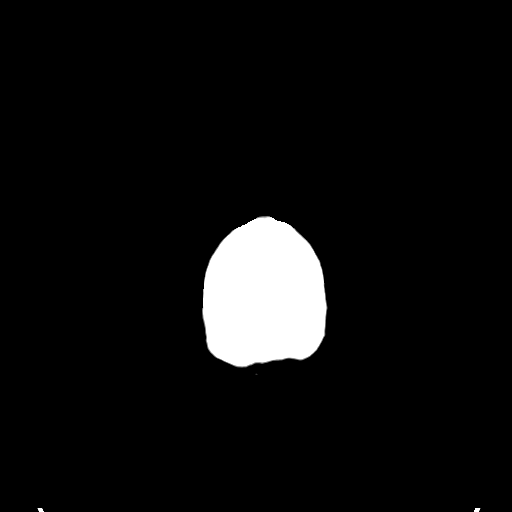
[im 34/36  brain]
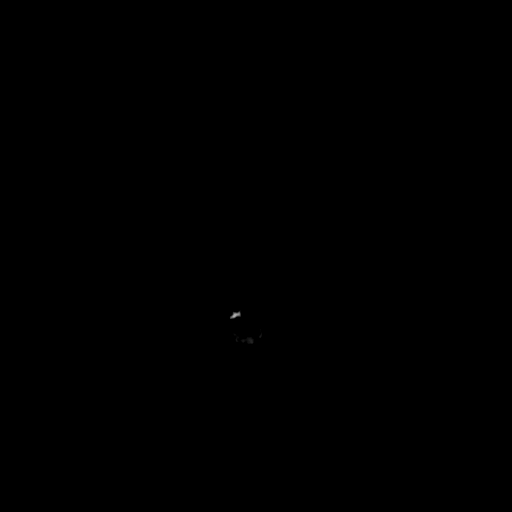

[16 of 30 positions shown; findings below may reference images not displayed]

FINDINGS: The brain has a normal appearance without evidence for
hemorrhage, acute infarction, hydrocephalus, or mass lesion.  There
is no extra axial fluid collection.  The skull and paranasal
sinuses are normal.
IMPRESSION: Normal CT of the head without contrast.

## 2014-10-02 IMAGING — CR DG CHEST 2V
2 series · 2 of 2 positions shown · non-contrast
Comparison: 05/17/2012

CLINICAL DATA: Shortness of breath. Left breast abscess drained.

EXAM:
CHEST  2 VIEW

[w chest pa]
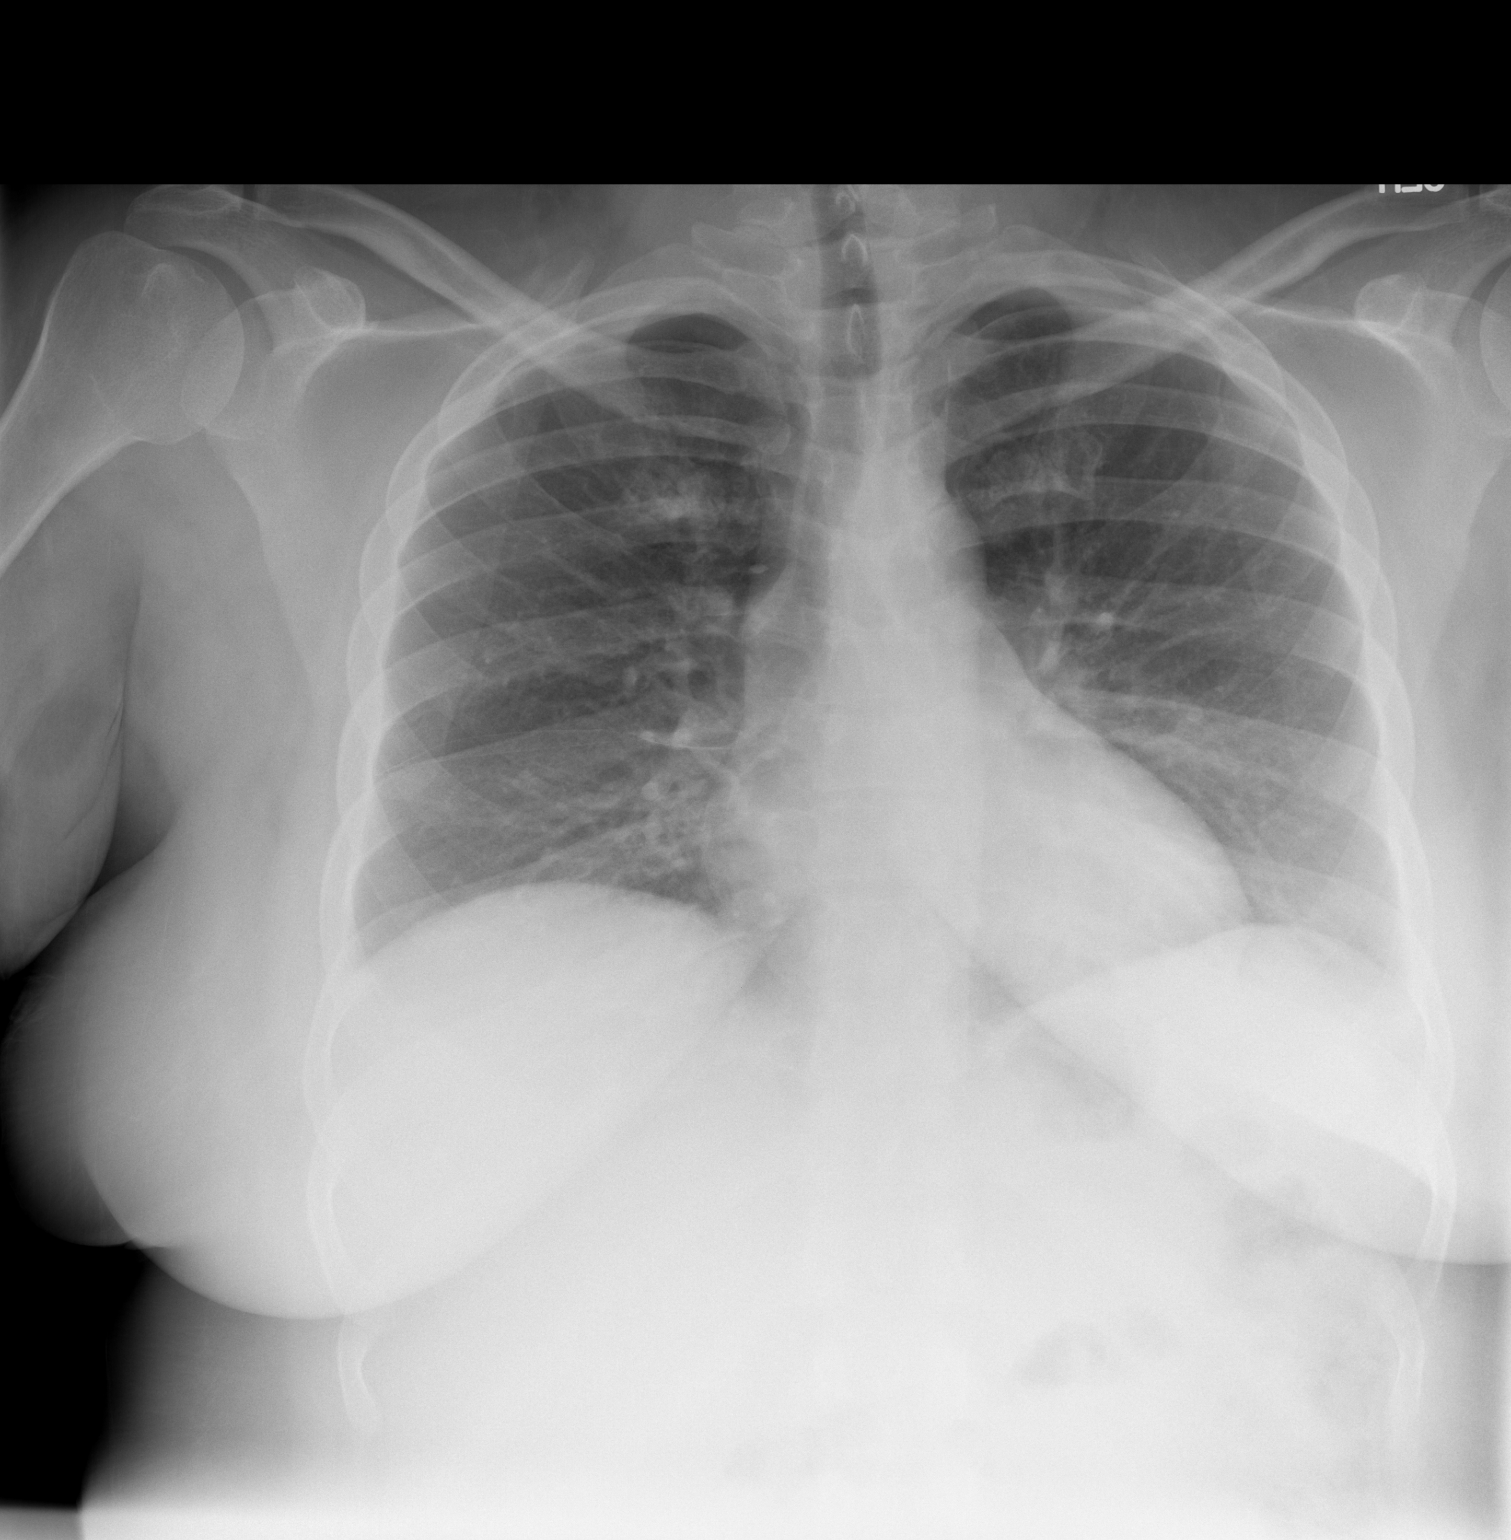

[w chest lat]
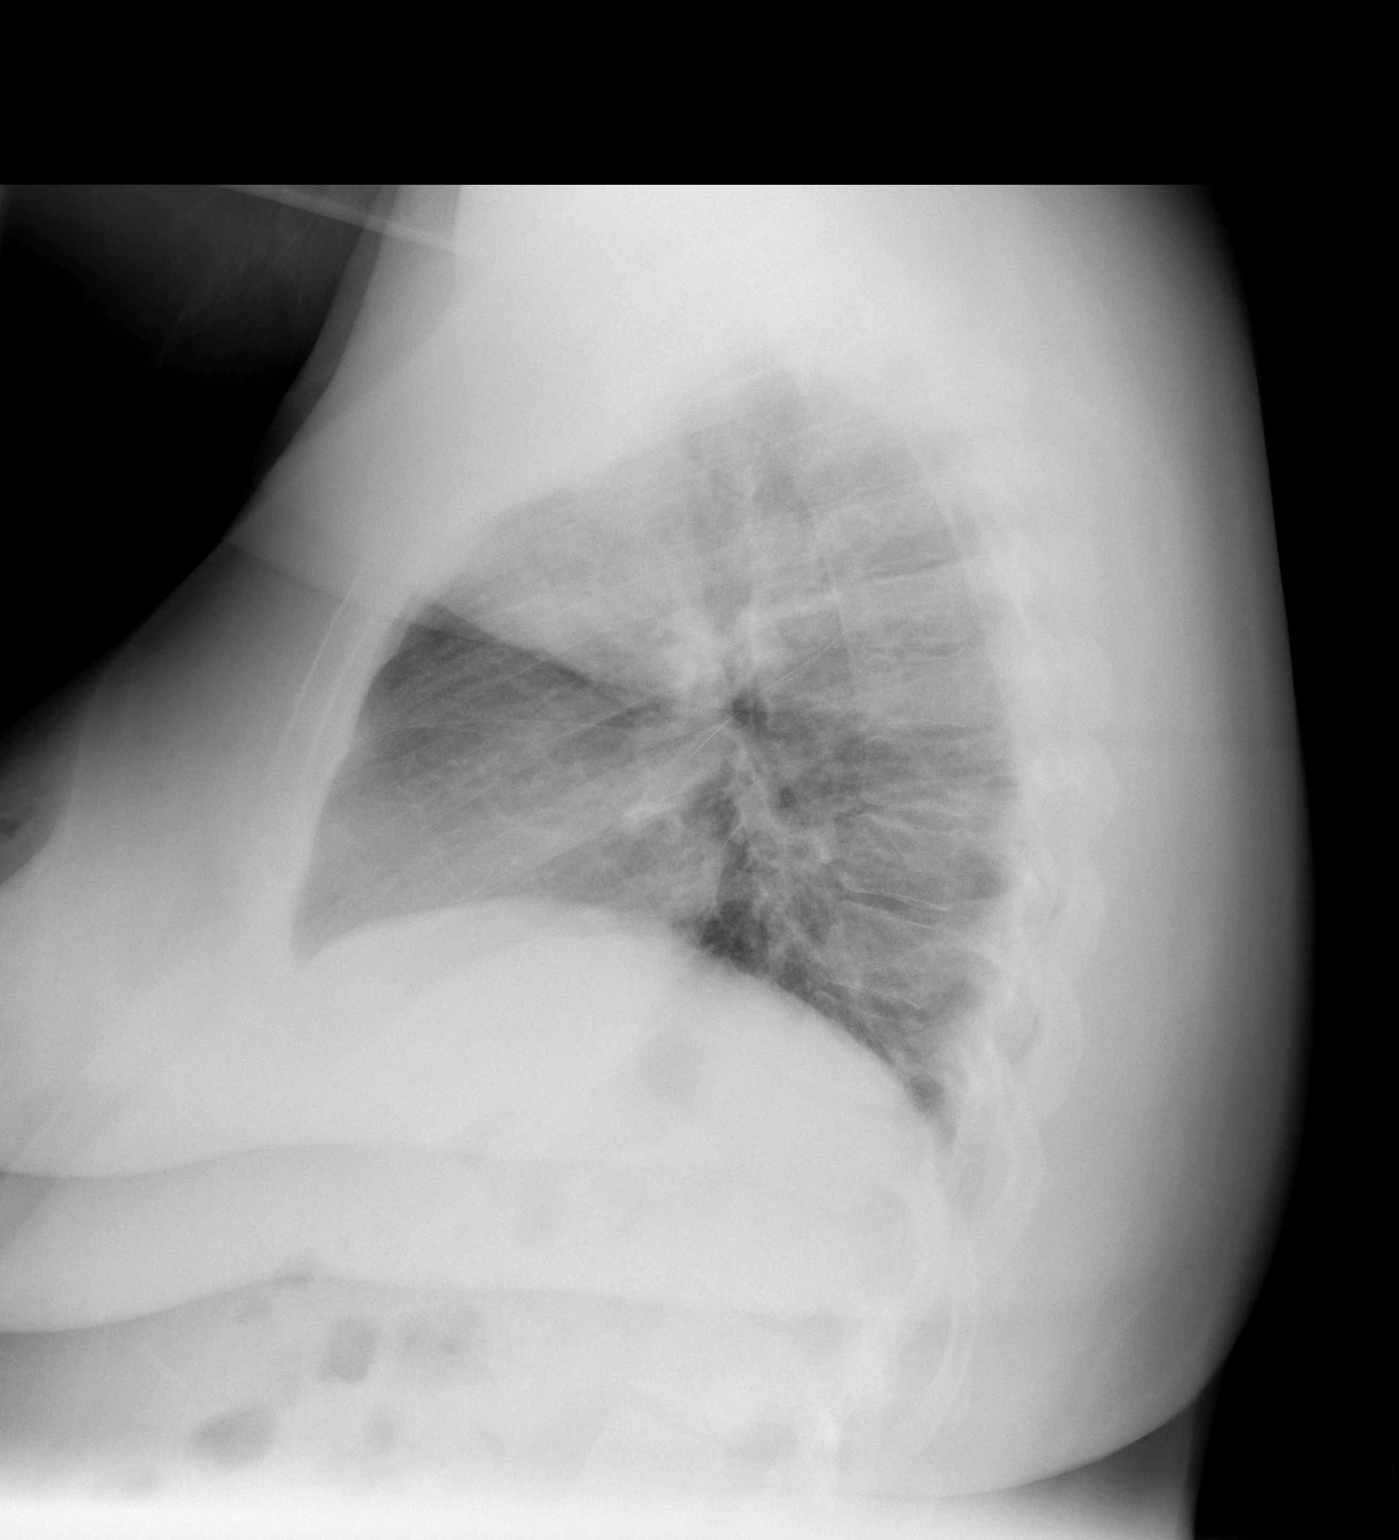

[2 of 2 positions shown; findings below may reference images not displayed]

FINDINGS: The heart size and mediastinal contours are within normal limits.
Both lungs are clear. The visualized skeletal structures are
unremarkable.
IMPRESSION: No active cardiopulmonary disease.

## 2019-03-24 ENCOUNTER — Ambulatory Visit (HOSPITAL_COMMUNITY): Payer: Medicaid Other | Admitting: Psychiatry

## 2019-04-06 ENCOUNTER — Ambulatory Visit (INDEPENDENT_AMBULATORY_CARE_PROVIDER_SITE_OTHER): Payer: Medicaid Other | Admitting: Psychiatry

## 2019-04-06 ENCOUNTER — Encounter (HOSPITAL_COMMUNITY): Payer: Self-pay | Admitting: Psychiatry

## 2019-04-06 DIAGNOSIS — F332 Major depressive disorder, recurrent severe without psychotic features: Secondary | ICD-10-CM

## 2019-04-06 DIAGNOSIS — F0630 Mood disorder due to known physiological condition, unspecified: Secondary | ICD-10-CM | POA: Diagnosis not present

## 2019-04-06 DIAGNOSIS — F411 Generalized anxiety disorder: Secondary | ICD-10-CM | POA: Diagnosis not present

## 2019-04-06 MED ORDER — ESCITALOPRAM OXALATE 10 MG PO TABS: 15.0000 mg | ORAL_TABLET | Freq: Every day | ORAL | 0 refills | Status: DC
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
## 2019-05-04 ENCOUNTER — Encounter (HOSPITAL_COMMUNITY): Payer: Self-pay | Admitting: Psychiatry

## 2019-05-04 ENCOUNTER — Ambulatory Visit (INDEPENDENT_AMBULATORY_CARE_PROVIDER_SITE_OTHER): Payer: Medicaid Other | Admitting: Psychiatry

## 2019-05-04 DIAGNOSIS — F411 Generalized anxiety disorder: Secondary | ICD-10-CM | POA: Diagnosis not present

## 2019-05-04 DIAGNOSIS — F0630 Mood disorder due to known physiological condition, unspecified: Secondary | ICD-10-CM

## 2019-05-04 DIAGNOSIS — F332 Major depressive disorder, recurrent severe without psychotic features: Secondary | ICD-10-CM | POA: Diagnosis not present

## 2019-05-04 MED ORDER — ESCITALOPRAM OXALATE 20 MG PO TABS: 20.0000 mg | ORAL_TABLET | Freq: Every day | ORAL | 0 refills | Status: DC

## 2019-05-04 NOTE — Progress Notes (Signed)
[place] Follow up visit   Patient Identification: Marissa Barrett MRN:  [id number] Date of Evaluation:  05/04/2019 Referral Source: primary care office Chief Complaint:  depression  Visit Diagnosis:    ICD-10-CM   1. Mood disorder in conditions classified elsewhere  F06.30   2. Severe episode of recurrent major depressive disorder, without psychotic features ([place])  F33.2   3. GAD (generalized anxiety disorder)  F41.1     I connected with Brandy Housand on 05/04/19 at  9:00 AM EDT by a video enabled telemedicine application and verified that I am speaking with the correct person using two identifiers.     I discussed the limitations of evaluation and management by telemedicine and the availability of in person appointments. The patient expressed understanding and agreed to proceed.  History of Present Illness: 43 years old currently married African-American female but separated referred by primary care physician for management of depression Patient has multiple medical concerns including breast cancer sarcoidosis heart condition and bypass graft    Multiple stressors including finances.  Multiple medical concerns main concern is she has to go to [facility] for  [facility] cardiology procedure.  Last visit lexapro was increased to 15mg . She still in [place] for the procedure but cant continue financially difficult situation and mom and daughter sick behind in [place].  Feels overwhelemed, did not schedule therapy as last recommended Worries related to her medical and heart condition. On NItroglycerine    No psychotic symptoms. No manic symptoms    Aggravating factor: CAD, cancer breast and surgeries, finances, multiple medical, daughter has austism Modifying factors daughters, grandkid, housing, disaiblity income, mom Duration more then 2 years    Past Psychiatric History: depression   Past Medical History:  Past Medical History:  Diagnosis Date  . Anxiety   . Asthma   . Daily headache    . Exertional dyspnea   . Gastritis    mild per EGD (04/2012)  . GERD (gastroesophageal reflux disease)   . History of blood transfusion 2003   "I was anemic" (07/23/2012)  . HLD (hyperlipidemia)   . Interstitial cystitis   . Migraines   . Type II diabetes mellitus ([place]) 2006   Requiring insulin    Past Surgical History:  Procedure Laterality Date  . ABDOMINAL HYSTERECTOMY  2004   "Adenomyosis" (07/23/2012)  . BLADDER SURGERY  2007   "opened me up; related to interstitial cystitis" (06/05/2012) bladder stimulator  . ESOPHAGOGASTRODUODENOSCOPY  05/09/2012   Procedure: ESOPHAGOGASTRODUODENOSCOPY (EGD);  Surgeon: Juanita Craver, MD;  [facility] ENDOSCOPY;  Service: Endoscopy;  Laterality: N/A;  . IRRIGATION AND DEBRIDEMENT ABSCESS Left 03/30/2013   Procedure: IRRIGATION AND DEBRIDEMENT LEFT BREAST ABSCESS;  Surgeon: Rolm Bookbinder, MD;  Location: [facility] OR;  Service: General;  Laterality: Left;    Family Psychiatric History: denies  Family History:  Family History  Problem Relation Age of Onset  . Sarcoidosis Mother   . Hypertension Mother   . Diabetes Mother   . Cerebral aneurysm Maternal Grandmother   . Hypothyroidism Mother     Social History:   Social History   Socioeconomic History  . Marital status: Married    Spouse name: Not on file  . Number of children: 2  . Years of education: college  . Highest education level: Not on file  Occupational History  . Occupation: unemployed  Social Needs  . Financial resource strain: Not on file  . Food insecurity    Worry: Not on file    Inability: Not on file  .  Transportation needs    Medical: Not on file    Non-medical: Not on file  Tobacco Use  . Smoking status: Never Smoker  . Smokeless tobacco: Never Used  Substance and Sexual Activity  . Alcohol use: No    Comment: rarely  . Drug use: No  . Sexual activity: Not Currently  Lifestyle  . Physical activity    Days per week: Not on file    Minutes per session: Not  on file  . Stress: Not on file  Relationships  . Social Herbalist on phone: Not on file    Gets together: Not on file    Attends religious service: Not on file    Active member of club or organization: Not on file    Attends meetings of clubs or organizations: Not on file    Relationship status: Not on file  Other Topics Concern  . Not on file  Social History Narrative   Lives in [place] with her family.       Allergies:   Allergies  Allergen Reactions  . Kiwi Extract Anaphylaxis, Itching and Swelling    And tongue swelling  . Estradiol Swelling  . Metformin And Related Nausea And Vomiting and Other (See Comments)    Severe diarrhea    Metabolic Disorder Labs: Lab Results  Component Value Date   HGBA1C 13.0 (H) 03/29/2013   MPG 326 (H) 03/29/2013   MPG 226 (H) 08/03/2012   No results found for: PROLACTIN Lab Results  Component Value Date   CHOL 214 (H) 07/24/2012   TRIG 170 (H) 07/24/2012   HDL 31 (L) 07/24/2012   CHOLHDL 6.9 07/24/2012   VLDL 34 07/24/2012   LDLCALC 149 (H) 07/24/2012   LDLCALC 195 (H) 02/15/2012   Lab Results  Component Value Date   TSH 16.784 (H) 03/29/2013    Therapeutic Level Labs: No results found for: LITHIUM No results found for: CBMZ No results found for: VALPROATE  Current Medications: Current Outpatient Medications  Medication Sig Dispense Refill  . acetaminophen (TYLENOL) 500 MG tablet Take 500 mg by mouth every 6 (six) hours as needed for pain.     [name] Kitchen albuterol (PROVENTIL HFA;VENTOLIN HFA) 108 (90 BASE) MCG/ACT inhaler Inhale 2 puffs into the lungs every 6 (six) hours as needed for wheezing or shortness of breath.     . busPIRone (BUSPAR) 5 MG tablet Take by mouth.    . escitalopram (LEXAPRO) 20 MG tablet Take 1 tablet (20 mg total) by mouth daily. 30 tablet 0  . insulin detemir (LEVEMIR) 100 UNIT/ML injection Inject 0.36 mLs (36 Units total) into the skin 2 (two) times daily. 10 mL 12  . insulin lispro  (HUMALOG) 100 UNIT/ML injection Inject 8 Units into the skin 3 (three) times daily before meals. 10 mL 0  . levothyroxine (SYNTHROID, LEVOTHROID) 200 MCG tablet Take 200 mcg by mouth daily before breakfast. Take on an empty stomach    . ondansetron (ZOFRAN) 8 MG tablet Take 8 mg by mouth every 8 (eight) hours as needed for nausea.     [name] Kitchen oxyCODONE-acetaminophen (PERCOCET/ROXICET) 5-325 MG per tablet Take 2 tablets by mouth every 4 (four) hours as needed for pain. 30 tablet 0  . promethazine (PHENERGAN) 25 MG tablet Take 25 mg by mouth every 6 (six) hours as needed for nausea.     . traMADol (ULTRAM) 50 MG tablet Take 50 mg by mouth every 6 (six) hours as needed for pain.  No current facility-administered medications for this visit.     Musculoskeletal:  Psychiatric Specialty Exam: Review of Systems  Psychiatric/Behavioral: Positive for depression. Negative for substance abuse and suicidal ideas.    Last menstrual period 02/15/2003.There is no height or weight on file to calculate BMI.  General Appearance: Casual  Eye Contact:  Fair  Speech:  Slow  Volume:  Decreased  Mood:  subdued  Affect:  Congruent  Thought Process:  Goal Directed  Orientation:  Full (Time, Place, and Person)  Thought Content:  Logical  Suicidal Thoughts:  No  Homicidal Thoughts:  No  Memory:  Immediate;   Fair Recent;   Fair  Judgement:  Fair  Insight:  Shallow  Psychomotor Activity:  Decreased  Concentration:  Concentration: Fair and Attention Span: Fair  Recall:  [employer] of Knowledge:Fair  Language: Fair  Akathisia:  No  Handed:  Right  AIMS (if indicated):  not done  Assets:  Housing  ADL's:  Intact  Cognition: WNL  Sleep:  variable   Screenings:   Assessment and Plan: as follows MOOd disorder unspecified: relavant to multiple medical conditions as above, cancer remission. CAD Subdued, increase lexapro to 20mg   MDD severe: subdued, increase lexapro to 20mg . Work on Chemical engineer and  strongly recommend to schedule for therapy  GAD: increase lexapro as above  sleep hygiene and avoid xanax unless its needed Highly recommend therapy to deal with current stressors  Also to call office or 911 if symptoms worsen, discussed option of admission to psych if needed. States not suicidal and has some family support   I discussed the assessment and treatment plan with the patient. The patient was provided an opportunity to ask questions and all were answered. The patient agreed with the plan and demonstrated an understanding of the instructions.   The patient was advised to call back or seek an in-person evaluation if the symptoms worsen or if the condition fails to improve as anticipated. Fu 3-4 w or earlier if needed  Merian Capron, MD 10/20/20209:14 AM

## 2019-06-03 ENCOUNTER — Ambulatory Visit (HOSPITAL_COMMUNITY): Payer: Medicaid Other | Admitting: Psychiatry

## 2019-06-29 ENCOUNTER — Encounter (HOSPITAL_COMMUNITY): Payer: Self-pay | Admitting: Psychiatry

## 2019-06-29 ENCOUNTER — Ambulatory Visit (INDEPENDENT_AMBULATORY_CARE_PROVIDER_SITE_OTHER): Payer: Medicaid Other | Admitting: Psychiatry

## 2019-06-29 DIAGNOSIS — F0630 Mood disorder due to known physiological condition, unspecified: Secondary | ICD-10-CM

## 2019-06-29 DIAGNOSIS — F332 Major depressive disorder, recurrent severe without psychotic features: Secondary | ICD-10-CM | POA: Diagnosis not present

## 2019-06-29 DIAGNOSIS — I2510 Atherosclerotic heart disease of native coronary artery without angina pectoris: Secondary | ICD-10-CM | POA: Diagnosis not present

## 2019-06-29 DIAGNOSIS — F411 Generalized anxiety disorder: Secondary | ICD-10-CM

## 2019-06-29 DIAGNOSIS — C50919 Malignant neoplasm of unspecified site of unspecified female breast: Secondary | ICD-10-CM

## 2019-06-29 DIAGNOSIS — Z951 Presence of aortocoronary bypass graft: Secondary | ICD-10-CM

## 2019-06-29 MED ORDER — ESCITALOPRAM OXALATE 20 MG PO TABS: 20.0000 mg | ORAL_TABLET | Freq: Every day | ORAL | 1 refills | Status: DC

## 2019-06-29 NOTE — Progress Notes (Signed)
[facility] Follow up visit   Patient Identification: Marissa Barrett MRN:  [id number] Date of Evaluation:  06/29/2019 Referral Source: primary care office Chief Complaint:  depression  Visit Diagnosis:    ICD-10-CM   1. Mood disorder in conditions classified elsewhere  F06.30   2. Severe episode of recurrent major depressive disorder, without psychotic features ([place])  F33.2   3. GAD (generalized anxiety disorder)  F41.1      I connected with Marissa Barrett on 06/29/19 at  1:30 PM EST by telephone and verified that I am speaking with the correct person using two identifiers.   I discussed the limitations of evaluation and management by telemedicine and the availability of in person appointments. The patient expressed understanding and agreed to proceed.  History of Present Illness: 43 years old currently married African-American female but separated referred by primary care physician for management of depression Patient has multiple medical concerns including breast cancer sarcoidosis heart condition and bypass graft    Multiple stressors including finances.  Multiple medical concerns which effect her mood. She had to go [place] for a procedure  lexapro was increased to 20mg , still CAD and medical conditions compromise her health and effect mood Scheduled for therapy tomorrow,  Takes buspar for anxiety Worries related to her medical and heart condition. On NItroglycerine    No psychotic symptoms. No manic symptoms    Aggravating factor: CAD, cancer breast and surgeries, finances, multiple medical, daughter has austism Modifying factors daughters, grandkid, housing, disaiblity income,mom Duration more then 2 years    Past Psychiatric History: depression   Past Medical History:  Past Medical History:  Diagnosis Date  . Anxiety   . Asthma   . Daily headache   . Exertional dyspnea   . Gastritis    mild per EGD (04/2012)  . GERD (gastroesophageal reflux disease)   . History of blood  transfusion 2003   "I was anemic" (07/23/2012)  . HLD (hyperlipidemia)   . Interstitial cystitis   . Migraines   . Type II diabetes mellitus ([place]) 2006   Requiring insulin    Past Surgical History:  Procedure Laterality Date  . ABDOMINAL HYSTERECTOMY  2004   "Adenomyosis" (07/23/2012)  . BLADDER SURGERY  2007   "opened me up; related to interstitial cystitis" (06/05/2012) bladder stimulator  . ESOPHAGOGASTRODUODENOSCOPY  05/09/2012   Procedure: ESOPHAGOGASTRODUODENOSCOPY (EGD);  Surgeon: Juanita Craver, MD;  Location: [facility] ENDOSCOPY;  Service: Endoscopy;  Laterality: N/A;  . IRRIGATION AND DEBRIDEMENT ABSCESS Left 03/30/2013   Procedure: IRRIGATION AND DEBRIDEMENT LEFT BREAST ABSCESS;  Surgeon: Rolm Bookbinder, MD;  Location: [facility] OR;  Service: General;  Laterality: Left;    Family Psychiatric History: denies  Family History:  Family History  Problem Relation Age of Onset  . Sarcoidosis Mother   . Hypertension Mother   . Diabetes Mother   . Cerebral aneurysm Maternal Grandmother   . Hypothyroidism Mother     Social History:   Social History   Socioeconomic History  . Marital status: Married    Spouse name: Not on file  . Number of children: 2  . Years of education: college  . Highest education level: Not on file  Occupational History  . Occupation: unemployed  Tobacco Use  . Smoking status: Never Smoker  . Smokeless tobacco: Never Used  Substance and Sexual Activity  . Alcohol use: No    Comment: rarely  . Drug use: No  . Sexual activity: Not Currently  Other Topics Concern  . Not on file  Social History Narrative   Lives in [place] with her family.   Social Determinants of Health   Financial Resource Strain:   . Difficulty of Paying Living Expenses: Not on file  Food Insecurity:   . Worried About Charity fundraiser in the Last Year: Not on file  . Ran Out of Food in the Last Year: Not on file  Transportation Needs:   . Lack of Transportation (Medical): Not  on file  . Lack of Transportation (Non-Medical): Not on file  Physical Activity:   . Days of Exercise per Week: Not on file  . Minutes of Exercise per Session: Not on file  Stress:   . Feeling of Stress : Not on file  Social Connections:   . Frequency of Communication with Friends and Family: Not on file  . Frequency of Social Gatherings with Friends and Family: Not on file  . Attends Religious Services: Not on file  . Active Member of Clubs or Organizations: Not on file  . Attends Archivist Meetings: Not on file  . Marital Status: Not on file       Allergies:   Allergies  Allergen Reactions  . Kiwi Extract Anaphylaxis, Itching and Swelling    And tongue swelling  . Estradiol Swelling  . Metformin And Related Nausea And Vomiting and Other (See Comments)    Severe diarrhea    Metabolic Disorder Labs: Lab Results  Component Value Date   HGBA1C 13.0 (H) 03/29/2013   MPG 326 (H) 03/29/2013   MPG 226 (H) 08/03/2012   No results found for: PROLACTIN Lab Results  Component Value Date   CHOL 214 (H) 07/24/2012   TRIG 170 (H) 07/24/2012   HDL 31 (L) 07/24/2012   CHOLHDL 6.9 07/24/2012   VLDL 34 07/24/2012   LDLCALC 149 (H) 07/24/2012   LDLCALC 195 (H) 02/15/2012   Lab Results  Component Value Date   TSH 16.784 (H) 03/29/2013    Therapeutic Level Labs: No results found for: LITHIUM No results found for: CBMZ No results found for: VALPROATE  Current Medications: Current Outpatient Medications  Medication Sig Dispense Refill  . acetaminophen (TYLENOL) 500 MG tablet Take 500 mg by mouth every 6 (six) hours as needed for pain.     [name] Kitchen albuterol (PROVENTIL HFA;VENTOLIN HFA) 108 (90 BASE) MCG/ACT inhaler Inhale 2 puffs into the lungs every 6 (six) hours as needed for wheezing or shortness of breath.     . busPIRone (BUSPAR) 5 MG tablet Take by mouth.    . escitalopram (LEXAPRO) 20 MG tablet Take 1 tablet (20 mg total) by mouth daily. 30 tablet 1  . insulin  detemir (LEVEMIR) 100 UNIT/ML injection Inject 0.36 mLs (36 Units total) into the skin 2 (two) times daily. 10 mL 12  . insulin lispro (HUMALOG) 100 UNIT/ML injection Inject 8 Units into the skin 3 (three) times daily before meals. 10 mL 0  . levothyroxine (SYNTHROID, LEVOTHROID) 200 MCG tablet Take 200 mcg by mouth daily before breakfast. Take on an empty stomach    . ondansetron (ZOFRAN) 8 MG tablet Take 8 mg by mouth every 8 (eight) hours as needed for nausea.     [name] Kitchen oxyCODONE-acetaminophen (PERCOCET/ROXICET) 5-325 MG per tablet Take 2 tablets by mouth every 4 (four) hours as needed for pain. 30 tablet 0  . promethazine (PHENERGAN) 25 MG tablet Take 25 mg by mouth every 6 (six) hours as needed for nausea.     . traMADol (ULTRAM) 50 MG tablet  Take 50 mg by mouth every 6 (six) hours as needed for pain.      No current facility-administered medications for this visit.    Musculoskeletal:  Psychiatric Specialty Exam: Review of Systems  Psychiatric/Behavioral: Positive for depression. Negative for substance abuse and suicidal ideas.    Last menstrual period 02/15/2003.There is no height or weight on file to calculate BMI.  General Appearance:   Eye Contact:    Speech:  Slow  Volume:  Decreased  Mood:  subdued  Affect:  Congruent  Thought Process:  Goal Directed  Orientation:  Full (Time, Place, and Person)  Thought Content:  Logical  Suicidal Thoughts:  No  Homicidal Thoughts:  No  Memory:  Immediate;   Fair Recent;   Fair  Judgement:  Fair  Insight:  Shallow  Psychomotor Activity:  Decreased  Concentration:  Concentration: Fair and Attention Span: Fair  Recall:  [employer] of Knowledge:Fair  Language: Fair  Akathisia:  No  Handed:  Right  AIMS (if indicated):  not done  Assets:  Housing  ADL's:  Intact  Cognition: WNL  Sleep:  variable   Screenings:   Assessment and Plan: as follows MOOd disorder unspecified: relavant to multiple medical conditions as above, cancer  remission. CAD Remains subdued but taking lexapro to keep some balance for depression. Will continue Scheduled for therapy tomorrow, encouraged to not miss.   MDD severe: subdued, continue lexapro. See above GAD: fluctuates, continue meds  Also to call office or 911 if symptoms worsen, discussed option of admission to psych if needed. States not suicidal and has some family support   I discussed the assessment and treatment plan with the patient. The patient was provided an opportunity to ask questions and all were answered. The patient agreed with the plan and demonstrated an understanding of the instructions.   The patient was advised to call back or seek an in-person evaluation if the symptoms worsen or if the condition fails to improve as anticipated. Fu 3-4 w or earlier if needed  Merian Capron, MD 12/15/20201:44 PM

## 2019-06-30 ENCOUNTER — Ambulatory Visit (INDEPENDENT_AMBULATORY_CARE_PROVIDER_SITE_OTHER): Payer: Medicaid Other | Admitting: Licensed Clinical Social Worker

## 2019-06-30 DIAGNOSIS — F332 Major depressive disorder, recurrent severe without psychotic features: Secondary | ICD-10-CM

## 2019-06-30 DIAGNOSIS — F0630 Mood disorder due to known physiological condition, unspecified: Secondary | ICD-10-CM

## 2019-06-30 DIAGNOSIS — F411 Generalized anxiety disorder: Secondary | ICD-10-CM

## 2019-06-30 NOTE — Progress Notes (Signed)
NA

## 2019-08-03 ENCOUNTER — Other Ambulatory Visit: Payer: Self-pay

## 2019-08-03 ENCOUNTER — Ambulatory Visit (INDEPENDENT_AMBULATORY_CARE_PROVIDER_SITE_OTHER): Payer: Medicare Other | Admitting: Psychiatry

## 2019-08-03 ENCOUNTER — Encounter (HOSPITAL_COMMUNITY): Payer: Self-pay | Admitting: Psychiatry

## 2019-08-03 DIAGNOSIS — F332 Major depressive disorder, recurrent severe without psychotic features: Secondary | ICD-10-CM | POA: Diagnosis not present

## 2019-08-03 DIAGNOSIS — F0630 Mood disorder due to known physiological condition, unspecified: Secondary | ICD-10-CM

## 2019-08-03 DIAGNOSIS — F411 Generalized anxiety disorder: Secondary | ICD-10-CM | POA: Diagnosis not present

## 2019-08-03 MED ORDER — ESCITALOPRAM OXALATE 20 MG PO TABS: 20.0000 mg | ORAL_TABLET | Freq: Every day | ORAL | 0 refills | Status: AC

## 2019-08-03 MED ORDER — BUSPIRONE HCL 5 MG PO TABS: 5.0000 mg | ORAL_TABLET | Freq: Two times a day (BID) | ORAL | 1 refills | Status: AC

## 2019-08-03 NOTE — Progress Notes (Signed)
Marissa Barrett Follow up visit   Patient Identification: Marissa Barrett MRN:  [id number] Date of Evaluation:  08/03/2019 Referral Source: primary care office Chief Complaint:  depression  Visit Diagnosis:    ICD-10-CM   1. Mood disorder in conditions classified elsewhere  F06.30   2. Severe episode of recurrent major depressive disorder, without psychotic features ([place])  F33.2   3. GAD (generalized anxiety disorder)  F41.1      I connected with Marissa Barrett on 08/03/19 at 10:30 AM EST by telephone and verified that I am speaking with the correct person using two identifiers.     I discussed the limitations of evaluation and management by telemedicine and the availability of in person appointments. The patient expressed understanding and agreed to proceed.  History of Present Illness: 44 years old currently married African-American female but separated referred by primary care physician for management of depression Patient has multiple medical concerns including breast cancer sarcoidosis heart condition and bypass graft    Multiple stressors including finances.  Multiple medical concerns which effect her mood. She had to go [place] for a procedure  Sarcoidosis, CAD and medical conditions compromise her health and effect mood Missed therapy but have scheduled  Remains concerned of physical health Not taking ellavil, takes lexapro to keep some help for depression Still anxious   Takes buspar for anxiety Worries related to her medical and heart condition. On NItroglycerine    No psychotic symptoms. No manic symptoms    Aggravating factor: CAd cancer breast and surgeries, finances, multiple medical, daughter has austism Modifying factors daughters, grandkid, housing, disaiblity income,mom Duration more then 2 years    Past Psychiatric History: depression   Past Medical History:  Past Medical History:  Diagnosis Date  . Anxiety   . Asthma   . Daily headache   . Exertional dyspnea   .  Gastritis    mild per EGD (04/2012)  . GERD (gastroesophageal reflux disease)   . History of blood transfusion 2003   "I was anemic" (07/23/2012)  . HLD (hyperlipidemia)   . Interstitial cystitis   . Migraines   . Type II diabetes mellitus ([place]) 2006   Requiring insulin    Past Surgical History:  Procedure Laterality Date  . ABDOMINAL HYSTERECTOMY  2004   "Adenomyosis" (07/23/2012)  . BLADDER SURGERY  2007   "opened me up; related to interstitial cystitis" (06/05/2012) bladder stimulator  . ESOPHAGOGASTRODUODENOSCOPY  05/09/2012   Procedure: ESOPHAGOGASTRODUODENOSCOPY (EGD);  Surgeon: Juanita Craver, MD;  Location: [facility] ENDOSCOPY;  Service: Endoscopy;  Laterality: N/A;  . IRRIGATION AND DEBRIDEMENT ABSCESS Left 03/30/2013   Procedure: IRRIGATION AND DEBRIDEMENT LEFT BREAST ABSCESS;  Surgeon: Rolm Bookbinder, MD;  Location: [facility] OR;  Service: General;  Laterality: Left;    Family Psychiatric History: denies  Family History:  Family History  Problem Relation Age of Onset  . Sarcoidosis Mother   . Hypertension Mother   . Diabetes Mother   . Cerebral aneurysm Maternal Grandmother   . Hypothyroidism Mother     Social History:   Social History   Socioeconomic History  . Marital status: Married    Spouse name: Not on file  . Number of children: 2  . Years of education: college  . Highest education level: Not on file  Occupational History  . Occupation: unemployed  Tobacco Use  . Smoking status: Never Smoker  . Smokeless tobacco: Never Used  Substance and Sexual Activity  . Alcohol use: No    Comment: rarely  . Drug  use: No  . Sexual activity: Not Currently  Other Topics Concern  . Not on file  Social History Narrative   Lives in [place] with her family.   Social Determinants of Health   Financial Resource Strain:   . Difficulty of Paying Living Expenses: Not on file  Food Insecurity:   . Worried About Charity fundraiser in the Last Year: Not on file  . Ran Out of  Food in the Last Year: Not on file  Transportation Needs:   . Lack of Transportation (Medical): Not on file  . Lack of Transportation (Non-Medical): Not on file  Physical Activity:   . Days of Exercise per Week: Not on file  . Minutes of Exercise per Session: Not on file  Stress:   . Feeling of Stress : Not on file  Social Connections:   . Frequency of Communication with Friends and Family: Not on file  . Frequency of Social Gatherings with Friends and Family: Not on file  . Attends Religious Services: Not on file  . Active Member of Clubs or Organizations: Not on file  . Attends Archivist Meetings: Not on file  . Marital Status: Not on file       Allergies:   Allergies  Allergen Reactions  . Kiwi Extract Anaphylaxis, Itching and Swelling    And tongue swelling  . Estradiol Swelling  . Metformin And Related Nausea And Vomiting and Other (See Comments)    Severe diarrhea    Metabolic Disorder Labs: Lab Results  Component Value Date   HGBA1C 13.0 (H) 03/29/2013   MPG 326 (H) 03/29/2013   MPG 226 (H) 08/03/2012   No results found for: PROLACTIN Lab Results  Component Value Date   CHOL 214 (H) 07/24/2012   TRIG 170 (H) 07/24/2012   HDL 31 (L) 07/24/2012   CHOLHDL 6.9 07/24/2012   VLDL 34 07/24/2012   LDLCALC 149 (H) 07/24/2012   LDLCALC 195 (H) 02/15/2012   Lab Results  Component Value Date   TSH 16.784 (H) 03/29/2013    Therapeutic Level Labs: No results found for: LITHIUM No results found for: CBMZ No results found for: VALPROATE  Current Medications: Current Outpatient Medications  Medication Sig Dispense Refill  . acetaminophen (TYLENOL) 500 MG tablet Take 500 mg by mouth every 6 (six) hours as needed for pain.     [name] Kitchen albuterol (PROVENTIL HFA;VENTOLIN HFA) 108 (90 BASE) MCG/ACT inhaler Inhale 2 puffs into the lungs every 6 (six) hours as needed for wheezing or shortness of breath.     . busPIRone (BUSPAR) 5 MG tablet Take 1 tablet (5 mg total)  by mouth 2 (two) times daily. 60 tablet 1  . escitalopram (LEXAPRO) 20 MG tablet Take 1 tablet (20 mg total) by mouth daily. 30 tablet 0  . insulin detemir (LEVEMIR) 100 UNIT/ML injection Inject 0.36 mLs (36 Units total) into the skin 2 (two) times daily. 10 mL 12  . insulin lispro (HUMALOG) 100 UNIT/ML injection Inject 8 Units into the skin 3 (three) times daily before meals. 10 mL 0  . levothyroxine (SYNTHROID, LEVOTHROID) 200 MCG tablet Take 200 mcg by mouth daily before breakfast. Take on an empty stomach    . ondansetron (ZOFRAN) 8 MG tablet Take 8 mg by mouth every 8 (eight) hours as needed for nausea.     [name] Kitchen oxyCODONE-acetaminophen (PERCOCET/ROXICET) 5-325 MG per tablet Take 2 tablets by mouth every 4 (four) hours as needed for pain. 30 tablet 0  .  promethazine (PHENERGAN) 25 MG tablet Take 25 mg by mouth every 6 (six) hours as needed for nausea.     . traMADol (ULTRAM) 50 MG tablet Take 50 mg by mouth every 6 (six) hours as needed for pain.      No current facility-administered medications for this visit.    Musculoskeletal:  Psychiatric Specialty Exam: Review of Systems  Psychiatric/Behavioral: Positive for depression. Negative for substance abuse and suicidal ideas.    Last menstrual period 02/15/2003.There is no height or weight on file to calculate BMI.  General Appearance:   Eye Contact:    Speech:  Slow  Volume:  Decreased  Mood:  stressed  Affect:  Congruent  Thought Process:  Goal Directed  Orientation:  Full (Time, Place, and Person)  Thought Content:  Logical  Suicidal Thoughts:  No  Homicidal Thoughts:  No  Memory:  Immediate;   Fair Recent;   Fair  Judgement:  Fair  Insight:  Shallow  Psychomotor Activity:  Decreased  Concentration:  Concentration: Fair and Attention Span: Fair  Recall:  [employer] of Knowledge:Fair  Language: Fair  Akathisia:  No  Handed:  Right  AIMS (if indicated):  not done  Assets:  Housing  ADL's:  Intact  Cognition: WNL  Sleep:   variable   Screenings:   Assessment and Plan: as follows MOOd disorder unspecified: relavant to multiple medical conditions as above, cancer remission. CAD Remains subdued will keep lexapro discussed distraction and schedule therapy again    MDD severe: see above GAD: fluctuates, increase buspar to bid  Also to call office or 911 if symptoms worsen, discussed option of admission to psych if needed. States not suicidal and has some family support   I discussed the assessment and treatment plan with the patient. The patient was provided an opportunity to ask questions and all were answered. The patient agreed with the plan and demonstrated an understanding of the instructions.   The patient was advised to call back or seek an in-person evaluation if the symptoms worsen or if the condition fails to improve as anticipated. Fu 3-4 w or earlier if needed  Merian Capron, MD 1/19/202110:42 AM

## 2019-08-10 ENCOUNTER — Other Ambulatory Visit: Payer: Self-pay

## 2019-08-10 ENCOUNTER — Ambulatory Visit (HOSPITAL_COMMUNITY): Payer: Medicare Other | Admitting: Licensed Clinical Social Worker

## 2019-09-03 ENCOUNTER — Ambulatory Visit (HOSPITAL_COMMUNITY): Payer: Medicare Other | Admitting: Psychiatry

## 2019-09-03 MED ORDER — PANTOPRAZOLE SODIUM 40 MG IV SOLR
40.00 | INTRAVENOUS | Status: DC
Start: 2019-09-03 — End: 2019-09-03

## 2019-09-03 MED ORDER — ENOXAPARIN SODIUM 40 MG/0.4ML ~~LOC~~ SOLN
40.00 | SUBCUTANEOUS | Status: DC
Start: 2019-09-03 — End: 2019-09-03

## 2019-09-03 MED ORDER — BUSPIRONE HCL 5 MG PO TABS
5.00 | ORAL_TABLET | ORAL | Status: DC
Start: 2019-09-03 — End: 2019-09-03

## 2019-09-03 MED ORDER — ALPRAZOLAM 0.5 MG PO TABS
0.50 | ORAL_TABLET | ORAL | Status: DC
Start: ? — End: 2019-09-03

## 2019-09-03 MED ORDER — TAMOXIFEN CITRATE 10 MG PO TABS
20.00 | ORAL_TABLET | ORAL | Status: DC
Start: 2019-09-03 — End: 2019-09-03

## 2019-09-03 MED ORDER — METOPROLOL TARTRATE 25 MG PO TABS
25.00 | ORAL_TABLET | ORAL | Status: DC
Start: 2019-09-03 — End: 2019-09-03

## 2019-09-03 MED ORDER — INSULIN LISPRO 100 UNIT/ML ~~LOC~~ SOLN
1.00 | SUBCUTANEOUS | Status: DC
Start: ? — End: 2019-09-03

## 2019-09-03 MED ORDER — ALBUTEROL SULFATE HFA 108 (90 BASE) MCG/ACT IN AERS
2.00 | INHALATION_SPRAY | RESPIRATORY_TRACT | Status: DC
Start: ? — End: 2019-09-03

## 2019-09-03 MED ORDER — LISINOPRIL 10 MG PO TABS
10.00 | ORAL_TABLET | ORAL | Status: DC
Start: 2019-09-04 — End: 2019-09-03

## 2019-09-03 MED ORDER — FUROSEMIDE 20 MG PO TABS
20.00 | ORAL_TABLET | ORAL | Status: DC
Start: 2019-09-03 — End: 2019-09-03

## 2019-09-03 MED ORDER — INSULIN GLARGINE 100 UNIT/ML ~~LOC~~ SOLN
1.00 | SUBCUTANEOUS | Status: DC
Start: 2019-09-03 — End: 2019-09-03

## 2019-09-03 MED ORDER — SODIUM CHLORIDE 0.9 % IV SOLN
75.00 | INTRAVENOUS | Status: DC
Start: ? — End: 2019-09-03

## 2019-09-03 MED ORDER — ONDANSETRON HCL 4 MG/2ML IJ SOLN
4.00 | INTRAMUSCULAR | Status: DC
Start: ? — End: 2019-09-03

## 2019-09-03 MED ORDER — AMLODIPINE BESYLATE 5 MG PO TABS
5.00 | ORAL_TABLET | ORAL | Status: DC
Start: 2019-09-03 — End: 2019-09-03

## 2019-09-03 MED ORDER — SODIUM CHLORIDE 0.9 % IV SOLN
10.00 | INTRAVENOUS | Status: DC
Start: ? — End: 2019-09-03

## 2019-09-03 MED ORDER — AMITRIPTYLINE HCL 25 MG PO TABS
25.00 | ORAL_TABLET | ORAL | Status: DC
Start: 2019-09-03 — End: 2019-09-03

## 2019-09-03 MED ORDER — INSULIN LISPRO 100 UNIT/ML ~~LOC~~ SOLN
1.00 | SUBCUTANEOUS | Status: DC
Start: 2019-09-03 — End: 2019-09-03

## 2019-09-03 MED ORDER — RANOLAZINE ER 500 MG PO TB12
1000.00 | ORAL_TABLET | ORAL | Status: DC
Start: 2019-09-03 — End: 2019-09-03

## 2019-09-03 MED ORDER — TICAGRELOR 90 MG PO TABS
90.00 | ORAL_TABLET | ORAL | Status: DC
Start: 2019-09-03 — End: 2019-09-03

## 2019-09-03 MED ORDER — ATORVASTATIN CALCIUM 20 MG PO TABS
80.00 | ORAL_TABLET | ORAL | Status: DC
Start: 2019-09-03 — End: 2019-09-03

## 2019-09-03 MED ORDER — GENERIC EXTERNAL MEDICATION
Status: DC
Start: ? — End: 2019-09-03

## 2019-09-03 MED ORDER — ESCITALOPRAM OXALATE 10 MG PO TABS
20.00 | ORAL_TABLET | ORAL | Status: DC
Start: 2019-09-03 — End: 2019-09-03

## 2019-09-03 MED ORDER — LEVOTHYROXINE SODIUM 125 MCG PO TABS
250.00 | ORAL_TABLET | ORAL | Status: DC
Start: 2019-09-03 — End: 2019-09-03

## 2019-09-03 MED ORDER — ISOSORBIDE MONONITRATE ER 30 MG PO TB24
90.00 | ORAL_TABLET | ORAL | Status: DC
Start: 2019-09-04 — End: 2019-09-03

## 2019-09-03 MED ORDER — ACETAMINOPHEN 325 MG PO TABS
650.00 | ORAL_TABLET | ORAL | Status: DC
Start: ? — End: 2019-09-03

## 2020-10-13 DEATH — deceased
